# Patient Record
Sex: Male | Born: 1935 | Race: White | Hispanic: No | Smoking: Former smoker
Health system: Southern US, Community
[De-identification: ages and names within clinical notes are randomized; demographics above are authoritative.]

## PROBLEM LIST (undated history)

## (undated) DIAGNOSIS — T4145XA Adverse effect of unspecified anesthetic, initial encounter: Secondary | ICD-10-CM

## (undated) DIAGNOSIS — I10 Essential (primary) hypertension: Secondary | ICD-10-CM

## (undated) DIAGNOSIS — H5462 Unqualified visual loss, left eye, normal vision right eye: Secondary | ICD-10-CM

## (undated) DIAGNOSIS — I442 Atrioventricular block, complete: Secondary | ICD-10-CM

## (undated) DIAGNOSIS — C801 Malignant (primary) neoplasm, unspecified: Secondary | ICD-10-CM

## (undated) DIAGNOSIS — G250 Essential tremor: Secondary | ICD-10-CM

## (undated) DIAGNOSIS — K8020 Calculus of gallbladder without cholecystitis without obstruction: Secondary | ICD-10-CM

## (undated) DIAGNOSIS — I495 Sick sinus syndrome: Secondary | ICD-10-CM

## (undated) DIAGNOSIS — I499 Cardiac arrhythmia, unspecified: Secondary | ICD-10-CM

## (undated) DIAGNOSIS — T8859XA Other complications of anesthesia, initial encounter: Secondary | ICD-10-CM

## (undated) DIAGNOSIS — E785 Hyperlipidemia, unspecified: Secondary | ICD-10-CM

## (undated) DIAGNOSIS — I219 Acute myocardial infarction, unspecified: Secondary | ICD-10-CM

## (undated) DIAGNOSIS — R0600 Dyspnea, unspecified: Secondary | ICD-10-CM

## (undated) DIAGNOSIS — R2689 Other abnormalities of gait and mobility: Secondary | ICD-10-CM

## (undated) DIAGNOSIS — H9192 Unspecified hearing loss, left ear: Secondary | ICD-10-CM

## (undated) DIAGNOSIS — I4891 Unspecified atrial fibrillation: Secondary | ICD-10-CM

## (undated) DIAGNOSIS — E039 Hypothyroidism, unspecified: Secondary | ICD-10-CM

## (undated) DIAGNOSIS — E119 Type 2 diabetes mellitus without complications: Secondary | ICD-10-CM

## (undated) DIAGNOSIS — N183 Chronic kidney disease, stage 3 unspecified: Secondary | ICD-10-CM

## (undated) DIAGNOSIS — J45909 Unspecified asthma, uncomplicated: Secondary | ICD-10-CM

## (undated) DIAGNOSIS — C4490 Unspecified malignant neoplasm of skin, unspecified: Secondary | ICD-10-CM

## (undated) DIAGNOSIS — Z950 Presence of cardiac pacemaker: Secondary | ICD-10-CM

## (undated) DIAGNOSIS — J449 Chronic obstructive pulmonary disease, unspecified: Secondary | ICD-10-CM

## (undated) DIAGNOSIS — I700 Atherosclerosis of aorta: Secondary | ICD-10-CM

## (undated) DIAGNOSIS — R4189 Other symptoms and signs involving cognitive functions and awareness: Secondary | ICD-10-CM

## (undated) DIAGNOSIS — K760 Fatty (change of) liver, not elsewhere classified: Secondary | ICD-10-CM

## (undated) HISTORY — PX: CATARACT EXTRACTION: SUR2

## (undated) HISTORY — PX: PACEMAKER GENERATOR CHANGE: SHX5998

## (undated) HISTORY — PX: NASAL SINUS SURGERY: SHX719

## (undated) HISTORY — PX: EYE SURGERY: SHX253

## (undated) HISTORY — PX: PACEMAKER INSERTION: SHX728

## (undated) HISTORY — PX: PROSTATE SURGERY: SHX751

---
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
## 1999-12-06 DIAGNOSIS — Z950 Presence of cardiac pacemaker: Secondary | ICD-10-CM

## 1999-12-06 HISTORY — DX: Presence of cardiac pacemaker: Z95.0

## 1999-12-06 HISTORY — PX: PACEMAKER INSERTION: SHX728

## 2000-09-16 HISTORY — PX: PACEMAKER INSERTION: SHX728

## 2006-05-06 ENCOUNTER — Ambulatory Visit: Payer: Self-pay | Admitting: Internal Medicine

## 2006-05-07 ENCOUNTER — Inpatient Hospital Stay: Payer: Self-pay | Admitting: Internal Medicine

## 2006-05-07 HISTORY — PX: PACEMAKER GENERATOR CHANGE: SHX5998

## 2007-10-05 ENCOUNTER — Ambulatory Visit: Payer: Self-pay | Admitting: Internal Medicine

## 2008-02-18 ENCOUNTER — Other Ambulatory Visit: Payer: Self-pay

## 2008-02-18 ENCOUNTER — Emergency Department: Payer: Self-pay | Admitting: Emergency Medicine

## 2008-03-01 ENCOUNTER — Emergency Department: Payer: Self-pay | Admitting: Emergency Medicine

## 2008-03-05 ENCOUNTER — Emergency Department: Payer: Self-pay | Admitting: Internal Medicine

## 2008-03-15 ENCOUNTER — Emergency Department: Payer: Self-pay | Admitting: Urology

## 2008-03-29 ENCOUNTER — Ambulatory Visit: Payer: Self-pay | Admitting: Specialist

## 2008-04-08 ENCOUNTER — Inpatient Hospital Stay: Payer: Self-pay | Admitting: Specialist

## 2011-05-30 ENCOUNTER — Ambulatory Visit: Payer: Self-pay | Admitting: Internal Medicine

## 2011-06-05 ENCOUNTER — Ambulatory Visit: Payer: Self-pay | Admitting: Internal Medicine

## 2011-07-30 ENCOUNTER — Ambulatory Visit: Payer: Self-pay | Admitting: Emergency Medicine

## 2011-08-02 LAB — PATHOLOGY REPORT

## 2011-09-20 DIAGNOSIS — E119 Type 2 diabetes mellitus without complications: Secondary | ICD-10-CM | POA: Diagnosis not present

## 2011-09-20 DIAGNOSIS — I10 Essential (primary) hypertension: Secondary | ICD-10-CM | POA: Diagnosis not present

## 2011-09-20 DIAGNOSIS — E039 Hypothyroidism, unspecified: Secondary | ICD-10-CM | POA: Diagnosis not present

## 2011-09-20 DIAGNOSIS — D518 Other vitamin B12 deficiency anemias: Secondary | ICD-10-CM | POA: Diagnosis not present

## 2011-10-23 DIAGNOSIS — E039 Hypothyroidism, unspecified: Secondary | ICD-10-CM | POA: Diagnosis not present

## 2011-12-20 DIAGNOSIS — D518 Other vitamin B12 deficiency anemias: Secondary | ICD-10-CM | POA: Diagnosis not present

## 2011-12-20 DIAGNOSIS — E119 Type 2 diabetes mellitus without complications: Secondary | ICD-10-CM | POA: Diagnosis not present

## 2011-12-20 DIAGNOSIS — E039 Hypothyroidism, unspecified: Secondary | ICD-10-CM | POA: Diagnosis not present

## 2011-12-20 DIAGNOSIS — I10 Essential (primary) hypertension: Secondary | ICD-10-CM | POA: Diagnosis not present

## 2012-01-30 DIAGNOSIS — R238 Other skin changes: Secondary | ICD-10-CM | POA: Diagnosis not present

## 2012-01-30 DIAGNOSIS — D485 Neoplasm of uncertain behavior of skin: Secondary | ICD-10-CM | POA: Diagnosis not present

## 2012-01-30 DIAGNOSIS — L259 Unspecified contact dermatitis, unspecified cause: Secondary | ICD-10-CM | POA: Diagnosis not present

## 2012-01-30 DIAGNOSIS — C44519 Basal cell carcinoma of skin of other part of trunk: Secondary | ICD-10-CM | POA: Diagnosis not present

## 2012-02-06 DIAGNOSIS — L259 Unspecified contact dermatitis, unspecified cause: Secondary | ICD-10-CM | POA: Diagnosis not present

## 2012-02-06 DIAGNOSIS — D485 Neoplasm of uncertain behavior of skin: Secondary | ICD-10-CM | POA: Diagnosis not present

## 2012-02-06 DIAGNOSIS — C44611 Basal cell carcinoma of skin of unspecified upper limb, including shoulder: Secondary | ICD-10-CM | POA: Diagnosis not present

## 2012-03-25 DIAGNOSIS — L570 Actinic keratosis: Secondary | ICD-10-CM | POA: Diagnosis not present

## 2012-03-25 DIAGNOSIS — D485 Neoplasm of uncertain behavior of skin: Secondary | ICD-10-CM | POA: Diagnosis not present

## 2012-03-25 DIAGNOSIS — C44519 Basal cell carcinoma of skin of other part of trunk: Secondary | ICD-10-CM | POA: Diagnosis not present

## 2012-03-25 DIAGNOSIS — C44611 Basal cell carcinoma of skin of unspecified upper limb, including shoulder: Secondary | ICD-10-CM | POA: Diagnosis not present

## 2012-03-26 DIAGNOSIS — E119 Type 2 diabetes mellitus without complications: Secondary | ICD-10-CM | POA: Diagnosis not present

## 2012-03-26 DIAGNOSIS — E559 Vitamin D deficiency, unspecified: Secondary | ICD-10-CM | POA: Diagnosis not present

## 2012-03-26 DIAGNOSIS — N400 Enlarged prostate without lower urinary tract symptoms: Secondary | ICD-10-CM | POA: Diagnosis not present

## 2012-03-26 DIAGNOSIS — E039 Hypothyroidism, unspecified: Secondary | ICD-10-CM | POA: Diagnosis not present

## 2012-03-26 DIAGNOSIS — Z950 Presence of cardiac pacemaker: Secondary | ICD-10-CM | POA: Diagnosis not present

## 2012-03-26 DIAGNOSIS — J989 Respiratory disorder, unspecified: Secondary | ICD-10-CM | POA: Diagnosis not present

## 2012-03-26 DIAGNOSIS — E7800 Pure hypercholesterolemia, unspecified: Secondary | ICD-10-CM | POA: Diagnosis not present

## 2012-04-01 DIAGNOSIS — C44711 Basal cell carcinoma of skin of unspecified lower limb, including hip: Secondary | ICD-10-CM | POA: Diagnosis not present

## 2012-04-01 DIAGNOSIS — L570 Actinic keratosis: Secondary | ICD-10-CM | POA: Diagnosis not present

## 2012-04-01 DIAGNOSIS — D485 Neoplasm of uncertain behavior of skin: Secondary | ICD-10-CM | POA: Diagnosis not present

## 2012-04-22 DIAGNOSIS — C4491 Basal cell carcinoma of skin, unspecified: Secondary | ICD-10-CM | POA: Diagnosis not present

## 2012-06-02 DIAGNOSIS — H25019 Cortical age-related cataract, unspecified eye: Secondary | ICD-10-CM | POA: Diagnosis not present

## 2012-06-23 DIAGNOSIS — N400 Enlarged prostate without lower urinary tract symptoms: Secondary | ICD-10-CM | POA: Diagnosis not present

## 2012-06-23 DIAGNOSIS — I10 Essential (primary) hypertension: Secondary | ICD-10-CM | POA: Diagnosis not present

## 2012-06-23 DIAGNOSIS — E559 Vitamin D deficiency, unspecified: Secondary | ICD-10-CM | POA: Diagnosis not present

## 2012-06-23 DIAGNOSIS — E039 Hypothyroidism, unspecified: Secondary | ICD-10-CM | POA: Diagnosis not present

## 2012-06-23 DIAGNOSIS — E119 Type 2 diabetes mellitus without complications: Secondary | ICD-10-CM | POA: Diagnosis not present

## 2012-07-07 DIAGNOSIS — L821 Other seborrheic keratosis: Secondary | ICD-10-CM | POA: Diagnosis not present

## 2012-07-07 DIAGNOSIS — L570 Actinic keratosis: Secondary | ICD-10-CM | POA: Diagnosis not present

## 2012-07-07 DIAGNOSIS — Z85828 Personal history of other malignant neoplasm of skin: Secondary | ICD-10-CM | POA: Diagnosis not present

## 2012-07-07 DIAGNOSIS — L259 Unspecified contact dermatitis, unspecified cause: Secondary | ICD-10-CM | POA: Diagnosis not present

## 2012-07-07 DIAGNOSIS — L578 Other skin changes due to chronic exposure to nonionizing radiation: Secondary | ICD-10-CM | POA: Diagnosis not present

## 2012-07-28 DIAGNOSIS — L570 Actinic keratosis: Secondary | ICD-10-CM | POA: Diagnosis not present

## 2012-09-23 DIAGNOSIS — I10 Essential (primary) hypertension: Secondary | ICD-10-CM | POA: Diagnosis not present

## 2012-09-23 DIAGNOSIS — E039 Hypothyroidism, unspecified: Secondary | ICD-10-CM | POA: Diagnosis not present

## 2012-09-23 DIAGNOSIS — E119 Type 2 diabetes mellitus without complications: Secondary | ICD-10-CM | POA: Diagnosis not present

## 2012-12-24 DIAGNOSIS — I10 Essential (primary) hypertension: Secondary | ICD-10-CM | POA: Diagnosis not present

## 2012-12-24 DIAGNOSIS — E119 Type 2 diabetes mellitus without complications: Secondary | ICD-10-CM | POA: Diagnosis not present

## 2012-12-24 DIAGNOSIS — E785 Hyperlipidemia, unspecified: Secondary | ICD-10-CM | POA: Diagnosis not present

## 2012-12-24 DIAGNOSIS — E039 Hypothyroidism, unspecified: Secondary | ICD-10-CM | POA: Diagnosis not present

## 2013-01-19 DIAGNOSIS — L259 Unspecified contact dermatitis, unspecified cause: Secondary | ICD-10-CM | POA: Diagnosis not present

## 2013-01-19 DIAGNOSIS — D1800 Hemangioma unspecified site: Secondary | ICD-10-CM | POA: Diagnosis not present

## 2013-01-19 DIAGNOSIS — L570 Actinic keratosis: Secondary | ICD-10-CM | POA: Diagnosis not present

## 2013-01-19 DIAGNOSIS — L821 Other seborrheic keratosis: Secondary | ICD-10-CM | POA: Diagnosis not present

## 2013-01-19 DIAGNOSIS — Z85828 Personal history of other malignant neoplasm of skin: Secondary | ICD-10-CM | POA: Diagnosis not present

## 2013-01-28 DIAGNOSIS — E785 Hyperlipidemia, unspecified: Secondary | ICD-10-CM | POA: Diagnosis not present

## 2013-01-28 DIAGNOSIS — I10 Essential (primary) hypertension: Secondary | ICD-10-CM | POA: Diagnosis not present

## 2013-01-28 DIAGNOSIS — I4891 Unspecified atrial fibrillation: Secondary | ICD-10-CM | POA: Diagnosis not present

## 2013-01-28 DIAGNOSIS — Z45018 Encounter for adjustment and management of other part of cardiac pacemaker: Secondary | ICD-10-CM | POA: Diagnosis not present

## 2013-01-28 DIAGNOSIS — Z950 Presence of cardiac pacemaker: Secondary | ICD-10-CM | POA: Diagnosis not present

## 2013-04-14 DIAGNOSIS — E039 Hypothyroidism, unspecified: Secondary | ICD-10-CM | POA: Diagnosis not present

## 2013-04-14 DIAGNOSIS — N400 Enlarged prostate without lower urinary tract symptoms: Secondary | ICD-10-CM | POA: Diagnosis not present

## 2013-04-14 DIAGNOSIS — E785 Hyperlipidemia, unspecified: Secondary | ICD-10-CM | POA: Diagnosis not present

## 2013-04-14 DIAGNOSIS — E119 Type 2 diabetes mellitus without complications: Secondary | ICD-10-CM | POA: Diagnosis not present

## 2013-04-20 DIAGNOSIS — Z950 Presence of cardiac pacemaker: Secondary | ICD-10-CM | POA: Diagnosis not present

## 2013-04-20 DIAGNOSIS — E785 Hyperlipidemia, unspecified: Secondary | ICD-10-CM | POA: Diagnosis not present

## 2013-04-20 DIAGNOSIS — I10 Essential (primary) hypertension: Secondary | ICD-10-CM | POA: Diagnosis not present

## 2013-04-21 DIAGNOSIS — N19 Unspecified kidney failure: Secondary | ICD-10-CM | POA: Diagnosis not present

## 2013-04-21 DIAGNOSIS — N281 Cyst of kidney, acquired: Secondary | ICD-10-CM | POA: Diagnosis not present

## 2013-06-07 DIAGNOSIS — H25019 Cortical age-related cataract, unspecified eye: Secondary | ICD-10-CM | POA: Diagnosis not present

## 2013-07-15 DIAGNOSIS — E785 Hyperlipidemia, unspecified: Secondary | ICD-10-CM | POA: Diagnosis not present

## 2013-07-15 DIAGNOSIS — N400 Enlarged prostate without lower urinary tract symptoms: Secondary | ICD-10-CM | POA: Diagnosis not present

## 2013-07-15 DIAGNOSIS — I10 Essential (primary) hypertension: Secondary | ICD-10-CM | POA: Diagnosis not present

## 2013-07-15 DIAGNOSIS — E039 Hypothyroidism, unspecified: Secondary | ICD-10-CM | POA: Diagnosis not present

## 2013-07-15 DIAGNOSIS — E119 Type 2 diabetes mellitus without complications: Secondary | ICD-10-CM | POA: Diagnosis not present

## 2013-07-20 DIAGNOSIS — L570 Actinic keratosis: Secondary | ICD-10-CM | POA: Diagnosis not present

## 2013-07-20 DIAGNOSIS — L820 Inflamed seborrheic keratosis: Secondary | ICD-10-CM | POA: Diagnosis not present

## 2013-07-20 DIAGNOSIS — L578 Other skin changes due to chronic exposure to nonionizing radiation: Secondary | ICD-10-CM | POA: Diagnosis not present

## 2013-07-20 DIAGNOSIS — D239 Other benign neoplasm of skin, unspecified: Secondary | ICD-10-CM | POA: Diagnosis not present

## 2013-07-20 DIAGNOSIS — L738 Other specified follicular disorders: Secondary | ICD-10-CM | POA: Diagnosis not present

## 2013-07-20 DIAGNOSIS — L719 Rosacea, unspecified: Secondary | ICD-10-CM | POA: Diagnosis not present

## 2013-07-20 DIAGNOSIS — L259 Unspecified contact dermatitis, unspecified cause: Secondary | ICD-10-CM | POA: Diagnosis not present

## 2013-07-20 DIAGNOSIS — L821 Other seborrheic keratosis: Secondary | ICD-10-CM | POA: Diagnosis not present

## 2013-07-22 DIAGNOSIS — E669 Obesity, unspecified: Secondary | ICD-10-CM | POA: Diagnosis not present

## 2013-07-22 DIAGNOSIS — Z950 Presence of cardiac pacemaker: Secondary | ICD-10-CM | POA: Diagnosis not present

## 2013-07-22 DIAGNOSIS — I10 Essential (primary) hypertension: Secondary | ICD-10-CM | POA: Diagnosis not present

## 2013-07-22 DIAGNOSIS — I499 Cardiac arrhythmia, unspecified: Secondary | ICD-10-CM | POA: Diagnosis not present

## 2013-10-14 DIAGNOSIS — I10 Essential (primary) hypertension: Secondary | ICD-10-CM | POA: Diagnosis not present

## 2013-10-14 DIAGNOSIS — R7989 Other specified abnormal findings of blood chemistry: Secondary | ICD-10-CM | POA: Diagnosis not present

## 2013-10-14 DIAGNOSIS — E039 Hypothyroidism, unspecified: Secondary | ICD-10-CM | POA: Diagnosis not present

## 2013-10-14 DIAGNOSIS — E785 Hyperlipidemia, unspecified: Secondary | ICD-10-CM | POA: Diagnosis not present

## 2013-10-21 DIAGNOSIS — I10 Essential (primary) hypertension: Secondary | ICD-10-CM | POA: Diagnosis not present

## 2013-10-21 DIAGNOSIS — Z950 Presence of cardiac pacemaker: Secondary | ICD-10-CM | POA: Diagnosis not present

## 2013-11-08 DIAGNOSIS — E039 Hypothyroidism, unspecified: Secondary | ICD-10-CM | POA: Diagnosis not present

## 2014-01-10 DIAGNOSIS — E119 Type 2 diabetes mellitus without complications: Secondary | ICD-10-CM | POA: Diagnosis not present

## 2014-01-10 DIAGNOSIS — E785 Hyperlipidemia, unspecified: Secondary | ICD-10-CM | POA: Diagnosis not present

## 2014-01-10 DIAGNOSIS — E039 Hypothyroidism, unspecified: Secondary | ICD-10-CM | POA: Diagnosis not present

## 2014-01-10 DIAGNOSIS — I10 Essential (primary) hypertension: Secondary | ICD-10-CM | POA: Diagnosis not present

## 2014-01-20 DIAGNOSIS — Z45018 Encounter for adjustment and management of other part of cardiac pacemaker: Secondary | ICD-10-CM | POA: Diagnosis not present

## 2014-01-20 DIAGNOSIS — I499 Cardiac arrhythmia, unspecified: Secondary | ICD-10-CM | POA: Diagnosis not present

## 2014-01-20 DIAGNOSIS — E669 Obesity, unspecified: Secondary | ICD-10-CM | POA: Diagnosis not present

## 2014-01-20 DIAGNOSIS — E785 Hyperlipidemia, unspecified: Secondary | ICD-10-CM | POA: Diagnosis not present

## 2014-01-20 DIAGNOSIS — I10 Essential (primary) hypertension: Secondary | ICD-10-CM | POA: Diagnosis not present

## 2014-03-08 DIAGNOSIS — E039 Hypothyroidism, unspecified: Secondary | ICD-10-CM | POA: Diagnosis not present

## 2014-03-08 DIAGNOSIS — N289 Disorder of kidney and ureter, unspecified: Secondary | ICD-10-CM | POA: Diagnosis not present

## 2014-04-12 DIAGNOSIS — E039 Hypothyroidism, unspecified: Secondary | ICD-10-CM | POA: Diagnosis not present

## 2014-04-21 DIAGNOSIS — Z950 Presence of cardiac pacemaker: Secondary | ICD-10-CM | POA: Diagnosis not present

## 2014-04-21 DIAGNOSIS — E785 Hyperlipidemia, unspecified: Secondary | ICD-10-CM | POA: Diagnosis not present

## 2014-04-21 DIAGNOSIS — I10 Essential (primary) hypertension: Secondary | ICD-10-CM | POA: Diagnosis not present

## 2014-04-21 DIAGNOSIS — Z45018 Encounter for adjustment and management of other part of cardiac pacemaker: Secondary | ICD-10-CM | POA: Diagnosis not present

## 2014-07-19 DIAGNOSIS — E119 Type 2 diabetes mellitus without complications: Secondary | ICD-10-CM | POA: Diagnosis not present

## 2014-07-19 DIAGNOSIS — E039 Hypothyroidism, unspecified: Secondary | ICD-10-CM | POA: Diagnosis not present

## 2014-07-19 DIAGNOSIS — E559 Vitamin D deficiency, unspecified: Secondary | ICD-10-CM | POA: Diagnosis not present

## 2014-07-19 DIAGNOSIS — N289 Disorder of kidney and ureter, unspecified: Secondary | ICD-10-CM | POA: Diagnosis not present

## 2014-07-19 DIAGNOSIS — N400 Enlarged prostate without lower urinary tract symptoms: Secondary | ICD-10-CM | POA: Diagnosis not present

## 2014-07-21 DIAGNOSIS — I119 Hypertensive heart disease without heart failure: Secondary | ICD-10-CM | POA: Diagnosis not present

## 2014-07-21 DIAGNOSIS — E784 Other hyperlipidemia: Secondary | ICD-10-CM | POA: Diagnosis not present

## 2014-07-21 DIAGNOSIS — Z950 Presence of cardiac pacemaker: Secondary | ICD-10-CM | POA: Diagnosis not present

## 2014-07-21 DIAGNOSIS — Z45010 Encounter for checking and testing of cardiac pacemaker pulse generator [battery]: Secondary | ICD-10-CM | POA: Diagnosis not present

## 2014-07-27 DIAGNOSIS — B354 Tinea corporis: Secondary | ICD-10-CM | POA: Diagnosis not present

## 2014-07-27 DIAGNOSIS — I8310 Varicose veins of unspecified lower extremity with inflammation: Secondary | ICD-10-CM | POA: Diagnosis not present

## 2014-07-27 DIAGNOSIS — L718 Other rosacea: Secondary | ICD-10-CM | POA: Diagnosis not present

## 2014-07-27 DIAGNOSIS — L578 Other skin changes due to chronic exposure to nonionizing radiation: Secondary | ICD-10-CM | POA: Diagnosis not present

## 2014-07-27 DIAGNOSIS — L300 Nummular dermatitis: Secondary | ICD-10-CM | POA: Diagnosis not present

## 2014-07-27 DIAGNOSIS — B359 Dermatophytosis, unspecified: Secondary | ICD-10-CM | POA: Diagnosis not present

## 2014-07-27 DIAGNOSIS — Z1283 Encounter for screening for malignant neoplasm of skin: Secondary | ICD-10-CM | POA: Diagnosis not present

## 2014-07-27 DIAGNOSIS — E08620 Diabetes mellitus due to underlying condition with diabetic dermatitis: Secondary | ICD-10-CM | POA: Diagnosis not present

## 2014-08-15 DIAGNOSIS — H269 Unspecified cataract: Secondary | ICD-10-CM | POA: Diagnosis not present

## 2014-09-05 DIAGNOSIS — E039 Hypothyroidism, unspecified: Secondary | ICD-10-CM | POA: Diagnosis not present

## 2014-09-28 DIAGNOSIS — B359 Dermatophytosis, unspecified: Secondary | ICD-10-CM | POA: Diagnosis not present

## 2014-09-28 DIAGNOSIS — L300 Nummular dermatitis: Secondary | ICD-10-CM | POA: Diagnosis not present

## 2014-09-28 DIAGNOSIS — Z79899 Other long term (current) drug therapy: Secondary | ICD-10-CM | POA: Diagnosis not present

## 2014-10-17 DIAGNOSIS — E119 Type 2 diabetes mellitus without complications: Secondary | ICD-10-CM | POA: Diagnosis not present

## 2014-10-17 DIAGNOSIS — E039 Hypothyroidism, unspecified: Secondary | ICD-10-CM | POA: Diagnosis not present

## 2014-10-17 DIAGNOSIS — N400 Enlarged prostate without lower urinary tract symptoms: Secondary | ICD-10-CM | POA: Diagnosis not present

## 2014-10-17 DIAGNOSIS — N289 Disorder of kidney and ureter, unspecified: Secondary | ICD-10-CM | POA: Diagnosis not present

## 2014-11-28 DIAGNOSIS — I8310 Varicose veins of unspecified lower extremity with inflammation: Secondary | ICD-10-CM | POA: Diagnosis not present

## 2014-11-28 DIAGNOSIS — D229 Melanocytic nevi, unspecified: Secondary | ICD-10-CM | POA: Diagnosis not present

## 2014-11-28 DIAGNOSIS — Z85828 Personal history of other malignant neoplasm of skin: Secondary | ICD-10-CM | POA: Diagnosis not present

## 2014-11-28 DIAGNOSIS — B353 Tinea pedis: Secondary | ICD-10-CM | POA: Diagnosis not present

## 2014-11-28 DIAGNOSIS — L300 Nummular dermatitis: Secondary | ICD-10-CM | POA: Diagnosis not present

## 2015-01-17 DIAGNOSIS — I158 Other secondary hypertension: Secondary | ICD-10-CM | POA: Diagnosis not present

## 2015-01-17 DIAGNOSIS — N289 Disorder of kidney and ureter, unspecified: Secondary | ICD-10-CM | POA: Diagnosis not present

## 2015-01-17 DIAGNOSIS — E039 Hypothyroidism, unspecified: Secondary | ICD-10-CM | POA: Diagnosis not present

## 2015-01-17 DIAGNOSIS — E119 Type 2 diabetes mellitus without complications: Secondary | ICD-10-CM | POA: Diagnosis not present

## 2015-01-26 ENCOUNTER — Encounter
Admission: RE | Admit: 2015-01-26 | Discharge: 2015-01-26 | Disposition: A | Discharge status: Home / Self Care | Payer: MEDICARE | Source: Ambulatory Visit | Attending: Internal Medicine | Admitting: Internal Medicine

## 2015-01-26 ENCOUNTER — Ambulatory Visit
Admission: RE | Admit: 2015-01-26 | Discharge: 2015-01-26 | Disposition: A | Discharge status: Home / Self Care | Payer: MEDICARE | Source: Ambulatory Visit | Attending: Cardiology | Admitting: Cardiology

## 2015-01-26 DIAGNOSIS — Z01818 Encounter for other preprocedural examination: Secondary | ICD-10-CM | POA: Insufficient documentation

## 2015-01-26 DIAGNOSIS — Z45018 Encounter for adjustment and management of other part of cardiac pacemaker: Secondary | ICD-10-CM | POA: Diagnosis not present

## 2015-01-26 DIAGNOSIS — Z950 Presence of cardiac pacemaker: Secondary | ICD-10-CM

## 2015-01-26 DIAGNOSIS — Z01810 Encounter for preprocedural cardiovascular examination: Secondary | ICD-10-CM | POA: Diagnosis not present

## 2015-01-26 HISTORY — DX: Hypothyroidism, unspecified: E03.9

## 2015-01-26 HISTORY — DX: Acute myocardial infarction, unspecified: I21.9

## 2015-01-26 HISTORY — DX: Essential (primary) hypertension: I10

## 2015-01-26 HISTORY — DX: Presence of cardiac pacemaker: Z95.0

## 2015-01-26 HISTORY — DX: Hyperlipidemia, unspecified: E78.5

## 2015-01-26 HISTORY — DX: Cardiac arrhythmia, unspecified: I49.9

## 2015-01-26 HISTORY — DX: Unspecified asthma, uncomplicated: J45.909

## 2015-01-26 LAB — DIFFERENTIAL
Basophils Absolute: 0.1 10*3/uL (ref 0–0.1)
Basophils Relative: 1 %
Eosinophils Absolute: 0.4 10*3/uL (ref 0–0.7)
Eosinophils Relative: 4 %
LYMPHS PCT: 28 %
Lymphs Abs: 2.9 10*3/uL (ref 1.0–3.6)
MONOS PCT: 6 %
Monocytes Absolute: 0.6 10*3/uL (ref 0.2–1.0)
NEUTROS ABS: 6.2 10*3/uL (ref 1.4–6.5)
NEUTROS PCT: 61 %

## 2015-01-26 LAB — URINALYSIS COMPLETE WITH MICROSCOPIC (ARMC ONLY)
BILIRUBIN URINE: NEGATIVE
Bacteria, UA: NONE SEEN
Glucose, UA: NEGATIVE mg/dL
Hgb urine dipstick: NEGATIVE
KETONES UR: NEGATIVE mg/dL
NITRITE: NEGATIVE
PH: 6 (ref 5.0–8.0)
PROTEIN: NEGATIVE mg/dL
Specific Gravity, Urine: 1.01 (ref 1.005–1.030)
Squamous Epithelial / LPF: NONE SEEN

## 2015-01-26 LAB — BASIC METABOLIC PANEL
Anion gap: 8 (ref 5–15)
BUN: 20 mg/dL (ref 6–20)
CO2: 24 mmol/L (ref 22–32)
Calcium: 9.1 mg/dL (ref 8.9–10.3)
Chloride: 107 mmol/L (ref 101–111)
Creatinine, Ser: 1.55 mg/dL — ABNORMAL HIGH (ref 0.61–1.24)
GFR calc Af Amer: 46 mL/min — ABNORMAL LOW (ref >60)
GFR calc non Af Amer: 40 mL/min — ABNORMAL LOW (ref >60)
GLUCOSE: 121 mg/dL — AB (ref 65–99)
POTASSIUM: 4.4 mmol/L (ref 3.5–5.1)
Sodium: 139 mmol/L (ref 135–145)

## 2015-01-26 LAB — CBC
HCT: 45.1 % (ref 40.0–52.0)
Hemoglobin: 14.9 g/dL (ref 13.0–18.0)
MCH: 30.3 pg (ref 26.0–34.0)
MCHC: 33.1 g/dL (ref 32.0–36.0)
MCV: 91.6 fL (ref 80.0–100.0)
PLATELETS: 207 10*3/uL (ref 150–440)
RBC: 4.93 MIL/uL (ref 4.40–5.90)
RDW: 13.5 % (ref 11.5–14.5)
WBC: 10.2 10*3/uL (ref 3.8–10.6)

## 2015-01-26 NOTE — Patient Instructions (Signed)
  Your procedure is scheduled on: 02/08/15 Report to Day Surgery. To find out your arrival time please call [phone] between 1PM - 3PM on 02/07/15.  Remember: Instructions that are not followed completely may result in serious medical risk, up to and including death, or upon the discretion of your surgeon and anesthesiologist your surgery may need to be rescheduled.    __x__ 1. Do not eat food or drink liquids after midnight. No gum chewing or hard candies.     ____ 2. No Alcohol for 24 hours before or after surgery.   ____ 3. Bring all medications with you on the day of surgery if instructed.    ____ 4. Notify your doctor if there is any change in your medical condition     (cold, fever, infections).     Do not wear jewelry, make-up, hairpins, clips or nail polish.  Do not wear lotions, powders, or perfumes. You may wear deodorant.  Do not shave 48 hours prior to surgery. Men may shave face and neck.  Do not bring valuables to the hospital.    [facility] is not responsible for any belongings or valuables.               Contacts, dentures or bridgework may not be worn into surgery.  Leave your suitcase in the car. After surgery it may be brought to your room.  For patients admitted to the hospital, discharge time is determined by your                treatment team.   Patients discharged the day of surgery will not be allowed to drive home.   Please read over the following fact sheets that you were given:      ____ Take these medicines the morning of surgery with A SIP OF WATER:    1. digoxin (LANOXIN) 0.125 MG tablet  2. diltiazem (DILACOR XR) 180 MG 24 hr capsule  3. levothyroxine (SYNTHROID, LEVOTHROID) 150 MCG table  4.  5.  6.  ____ Fleet Enema (as directed)   __x__ Use CHG Soap as directed  ____ Use inhalers on the day of surgery  ____ Stop metformin 2 days prior to surgery    ____ Take 1/2 of usual insulin dose the night before surgery and none on the morning  of surgery.   _x___ Stop Coumadin/Plavix/aspirin on Stop aspirin 1 week before surgery  ____ Stop Anti-inflammatories on    ____ Stop supplements until after surgery.    ____ Bring C-Pap to the hospital.

## 2015-01-26 NOTE — OR Nursing (Signed)
Dr Lavera Guise notified regarding allergy to amoxicillin "swelling".

## 2015-02-08 ENCOUNTER — Ambulatory Visit
Admission: RE | Admit: 2015-02-08 | Discharge: 2015-02-08 | Disposition: A | Discharge status: Home / Self Care | Payer: MEDICARE | Source: Ambulatory Visit | Attending: Cardiology | Admitting: Cardiology

## 2015-02-08 ENCOUNTER — Ambulatory Visit: Payer: MEDICARE | Admitting: Certified Registered Nurse Anesthetist

## 2015-02-08 ENCOUNTER — Encounter
Admission: RE | Disposition: A | Discharge status: Home / Self Care | Payer: Self-pay | Source: Ambulatory Visit | Attending: Cardiology

## 2015-02-08 DIAGNOSIS — Z880 Allergy status to penicillin: Secondary | ICD-10-CM | POA: Insufficient documentation

## 2015-02-08 DIAGNOSIS — J45909 Unspecified asthma, uncomplicated: Secondary | ICD-10-CM | POA: Insufficient documentation

## 2015-02-08 DIAGNOSIS — I119 Hypertensive heart disease without heart failure: Secondary | ICD-10-CM | POA: Diagnosis not present

## 2015-02-08 DIAGNOSIS — Z79899 Other long term (current) drug therapy: Secondary | ICD-10-CM | POA: Diagnosis not present

## 2015-02-08 DIAGNOSIS — Z45010 Encounter for checking and testing of cardiac pacemaker pulse generator [battery]: Secondary | ICD-10-CM | POA: Diagnosis not present

## 2015-02-08 DIAGNOSIS — Z87891 Personal history of nicotine dependence: Secondary | ICD-10-CM | POA: Insufficient documentation

## 2015-02-08 DIAGNOSIS — E669 Obesity, unspecified: Secondary | ICD-10-CM | POA: Insufficient documentation

## 2015-02-08 DIAGNOSIS — E119 Type 2 diabetes mellitus without complications: Secondary | ICD-10-CM | POA: Diagnosis not present

## 2015-02-08 DIAGNOSIS — I4891 Unspecified atrial fibrillation: Secondary | ICD-10-CM | POA: Diagnosis not present

## 2015-02-08 DIAGNOSIS — I252 Old myocardial infarction: Secondary | ICD-10-CM | POA: Insufficient documentation

## 2015-02-08 DIAGNOSIS — I498 Other specified cardiac arrhythmias: Secondary | ICD-10-CM | POA: Insufficient documentation

## 2015-02-08 DIAGNOSIS — E039 Hypothyroidism, unspecified: Secondary | ICD-10-CM | POA: Insufficient documentation

## 2015-02-08 DIAGNOSIS — I10 Essential (primary) hypertension: Secondary | ICD-10-CM | POA: Diagnosis not present

## 2015-02-08 DIAGNOSIS — Z7982 Long term (current) use of aspirin: Secondary | ICD-10-CM | POA: Insufficient documentation

## 2015-02-08 DIAGNOSIS — H9190 Unspecified hearing loss, unspecified ear: Secondary | ICD-10-CM | POA: Insufficient documentation

## 2015-02-08 DIAGNOSIS — E785 Hyperlipidemia, unspecified: Secondary | ICD-10-CM | POA: Diagnosis present

## 2015-02-08 HISTORY — PX: PACEMAKER GENERATOR CHANGE: SHX5998

## 2015-02-08 SURGERY — PACEMAKER GENERATOR CHANGE
Anesthesia: Monitor Anesthesia Care | Site: Chest | Wound class: Clean

## 2015-02-08 MED ORDER — FAMOTIDINE 20 MG PO TABS
ORAL_TABLET | ORAL | Status: AC
  Administered 2015-02-08: 20 mg via ORAL
  Filled 2015-02-08: qty 1

## 2015-02-08 MED ORDER — LIDOCAINE HCL (PF) 1 % IJ SOLN
INTRAMUSCULAR | Status: DC | PRN
  Administered 2015-02-08: 14 mL via SUBCUTANEOUS

## 2015-02-08 MED ORDER — GENTAMICIN SULFATE 40 MG/ML IJ SOLN
INTRAMUSCULAR | Status: AC
  Filled 2015-02-08: qty 2

## 2015-02-08 MED ORDER — FENTANYL CITRATE (PF) 100 MCG/2ML IJ SOLN
INTRAMUSCULAR | Status: DC | PRN
  Administered 2015-02-08 (×2): 25 ug via INTRAVENOUS

## 2015-02-08 MED ORDER — CEFAZOLIN SODIUM 1-5 GM-% IV SOLN
1.0000 g | Freq: Once | INTRAVENOUS | Status: AC
  Administered 2015-02-08: 1 g via INTRAVENOUS

## 2015-02-08 MED ORDER — MIDAZOLAM HCL 2 MG/2ML IJ SOLN
INTRAMUSCULAR | Status: DC | PRN
  Administered 2015-02-08: 2 mg via INTRAVENOUS

## 2015-02-08 MED ORDER — PROPOFOL INFUSION 10 MG/ML OPTIME
INTRAVENOUS | Status: DC | PRN
  Administered 2015-02-08: 25 ug/kg/min via INTRAVENOUS

## 2015-02-08 MED ORDER — FAMOTIDINE 20 MG PO TABS
20.0000 mg | ORAL_TABLET | Freq: Once | ORAL | Status: AC
  Administered 2015-02-08: 20 mg via ORAL

## 2015-02-08 MED ORDER — FENTANYL CITRATE (PF) 100 MCG/2ML IJ SOLN: 25.0000 ug | INTRAMUSCULAR | Status: DC | PRN

## 2015-02-08 MED ORDER — ONDANSETRON HCL 4 MG/2ML IJ SOLN: 4.0000 mg | Freq: Once | INTRAMUSCULAR | Status: DC | PRN

## 2015-02-08 MED ORDER — LIDOCAINE HCL (CARDIAC) 20 MG/ML IV SOLN
INTRAVENOUS | Status: DC | PRN
  Administered 2015-02-08: 50 mg via INTRAVENOUS

## 2015-02-08 MED ORDER — LACTATED RINGERS IV SOLN
INTRAVENOUS | Status: DC
  Administered 2015-02-08 (×2): via INTRAVENOUS

## 2015-02-08 MED ORDER — SODIUM CHLORIDE 0.9 % IJ SOLN
INTRAMUSCULAR | Status: AC
  Filled 2015-02-08: qty 50

## 2015-02-08 MED ORDER — LIDOCAINE HCL (PF) 1 % IJ SOLN
INTRAMUSCULAR | Status: AC
  Filled 2015-02-08: qty 30

## 2015-02-08 MED ORDER — LIDOCAINE HCL (PF) 1 % IJ SOLN
INTRAMUSCULAR | Status: AC
  Filled 2015-02-08: qty 2

## 2015-02-08 MED ORDER — CEFAZOLIN SODIUM 1-5 GM-% IV SOLN
INTRAVENOUS | Status: AC
  Administered 2015-02-08: 1 g via INTRAVENOUS
  Filled 2015-02-08: qty 50

## 2015-02-08 MED ORDER — SODIUM CHLORIDE 0.9 % IR SOLN
Status: DC | PRN
  Administered 2015-02-08: 40 mL

## 2015-02-08 MED ORDER — ACETAMINOPHEN ER 650 MG PO TBCR: 650.0000 mg | EXTENDED_RELEASE_TABLET | Freq: Three times a day (TID) | ORAL | Status: DC | PRN

## 2015-02-08 SURGICAL SUPPLY — 33 items
BAG DECANTER STRL (MISCELLANEOUS) ×3 IMPLANT
BLADE SURG 15 STRL LF DISP TIS (BLADE) ×2 IMPLANT
BLADE SURG 15 STRL SS (BLADE) ×1
CABLE PACEMAKER EXTEN DISP (MISCELLANEOUS) ×3 IMPLANT
CANISTER SUCT 1200ML W/VALVE (MISCELLANEOUS) ×3 IMPLANT
CHLORAPREP W/TINT 26ML (MISCELLANEOUS) ×3 IMPLANT
COVER LIGHT HANDLE STERIS (MISCELLANEOUS) ×6 IMPLANT
COVER MAYO STAND STRL (DRAPES) ×3 IMPLANT
DRAPE C-ARM XRAY 36X54 (DRAPES) ×3 IMPLANT
DRAPE CAMERA CLOSED 9X96 (DRAPES) ×3 IMPLANT
DRESSING TELFA 4X3 1S ST N-ADH (GAUZE/BANDAGES/DRESSINGS) ×3 IMPLANT
DRSG TEGADERM 4X4.75 (GAUZE/BANDAGES/DRESSINGS) ×3 IMPLANT
DRSG TELFA 3X8 NADH (GAUZE/BANDAGES/DRESSINGS) ×3 IMPLANT
GENERATOR PACEMAKER (Pacemaker) ×3 IMPLANT
GLOVE BIO SURGEON STRL SZ7 (GLOVE) ×3 IMPLANT
GOWN STRL REUS W/ TWL LRG LVL3 (GOWN DISPOSABLE) ×2 IMPLANT
GOWN STRL REUS W/TWL LRG LVL3 (GOWN DISPOSABLE) ×1
IV NS 500ML (IV SOLUTION) ×1
IV NS 500ML BAXH (IV SOLUTION) ×2 IMPLANT
KIT RM TURNOVER STRD PROC AR (KITS) ×3 IMPLANT
KIT WRENCH PACEMAKER ASSEM (MISCELLANEOUS) ×3 IMPLANT
LABEL OR SOLS (LABEL) ×3 IMPLANT
MARKER SKIN W/RULER 31145785 (MISCELLANEOUS) ×3 IMPLANT
NDL SAFETY 25GX1.5 (NEEDLE) ×3 IMPLANT
NS IRRIG 500ML POUR BTL (IV SOLUTION) ×3 IMPLANT
PAD GROUND ADULT SPLIT (MISCELLANEOUS) ×3 IMPLANT
PAD STATPAD (MISCELLANEOUS) ×3 IMPLANT
SLING ARM LRG DEEP (SOFTGOODS) IMPLANT
STRIP CLOSURE SKIN 1/2X4 (GAUZE/BANDAGES/DRESSINGS) ×3 IMPLANT
SUT SILK 2 0 SH (SUTURE) ×3 IMPLANT
SUT SILK 3 0 (SUTURE) ×1
SUT SILK 3-0 18XBRD TIE 12 (SUTURE) ×2 IMPLANT
SUT SILK 4 0 SH (SUTURE) ×3 IMPLANT

## 2015-02-08 NOTE — Discharge Instructions (Signed)
Call for any pain , or bleeing

## 2015-02-08 NOTE — Brief Op Note (Signed)
02/08/2015  1:16 PM  PATIENT:  Samuel Mack  81 y.o. male  PRE-OPERATIVE DIAGNOSIS:  PACEMAKER GENERATOR CHANGE  POST-OPERATIVE DIAGNOSIS:  same  PROCEDURE:  Procedure(s): PACEMAKER GENERATOR CHANGE (Left)  SURGEON:  Surgeon(s) and Role:    * Cletis Athens, MD - Primary  PHYSICIAN ASSISTANT:   ASSISTANTS: none   ANESTHESIA:   local  EBL:  Total I/O In: 740 [P.O.:327; I.V.:400] Out: -   BLOOD ADMINISTERED:none  DRAINS: none   LOCAL MEDICATIONS USED:  LIDOCAINE   SPECIMEN:  No Specimen  DISPOSITION OF SPECIMEN:  N/A  COUNTS:  YES  TOURNIQUET:  * No tourniquets in log *  DICTATION: .Other Dictation: Dictation Number [id number]  PLAN OF CARE: Discharge to home after PACU  PATIENT DISPOSITION:  PACU - hemodynamically stable.   Delay start of Pharmacological VTE agent (>24hrs) due to surgical blood loss or risk of bleeding: not applicable

## 2015-02-08 NOTE — Transfer of Care (Signed)
Immediate Anesthesia Transfer of Care Note  Patient: Samuel Mack  Procedure(s) Performed: Procedure(s): PACEMAKER GENERATOR CHANGE (Left)  Patient Location: PACU  Anesthesia Type:MAC  Level of Consciousness: Alert, Awake, Oriented  Airway & Oxygen Therapy: Patient Spontanous Breathing  Post-op Assessment: Report given to RN  Post vital signs: Reviewed and stable  Last Vitals:  Filed Vitals:   02/08/15 1214  BP: 137/79  Pulse: 60  Temp: 36.2 C  Resp: 15    Complications: No apparent anesthesia complications

## 2015-02-08 NOTE — Anesthesia Postprocedure Evaluation (Signed)
  Anesthesia Post-op Note  Patient: Samuel Mack  Procedure(s) Performed: Procedure(s): PACEMAKER GENERATOR CHANGE (Left)  Anesthesia type:MAC  Patient location: PACU  Post pain: Pain level controlled  Post assessment: Post-op Vital signs reviewed, Patient's Cardiovascular Status Stable, Respiratory Function Stable, Patent Airway and No signs of Nausea or vomiting  Post vital signs: Reviewed and stable  Last Vitals:  Filed Vitals:   02/08/15 1214  BP: 137/79  Pulse: 60  Temp: 36.2 C  Resp: 15    Level of consciousness: awake, alert  and patient cooperative  Complications: No apparent anesthesia complications

## 2015-02-08 NOTE — H&P (Signed)
   History reviewed, patient examined, no change in status, stable for surgery.  

## 2015-02-08 NOTE — Anesthesia Preprocedure Evaluation (Signed)
Anesthesia Evaluation  Patient identified by MRN, date of birth, ID band Patient awake    Reviewed: Allergy & Precautions, NPO status   History of Anesthesia Complications (+) AWARENESS UNDER ANESTHESIA  Airway Mallampati: III       Dental  (+) Upper Dentures, Lower Dentures   Pulmonary asthma , former smoker,    + decreased breath sounds      Cardiovascular hypertension, + Past MI + dysrhythmias + pacemaker Rhythm:Irregular     Neuro/Psych negative neurological ROS  negative psych ROS   GI/Hepatic Neg liver ROS,   Endo/Other  Hypothyroidism   Renal/GU negative Renal ROS  negative genitourinary   Musculoskeletal negative musculoskeletal ROS (+)   Abdominal Normal abdominal exam  (+)   Peds negative pediatric ROS (+)  Hematology   Anesthesia Other Findings   Reproductive/Obstetrics negative OB ROS                             Anesthesia Physical Anesthesia Plan  ASA: III  Anesthesia Plan: MAC   Post-op Pain Management:    Induction: Intravenous  Airway Management Planned: Nasal Cannula  Additional Equipment:   Intra-op Plan:   Post-operative Plan:   Informed Consent: I have reviewed the patients History and Physical, chart, labs and discussed the procedure including the risks, benefits and alternatives for the proposed anesthesia with the patient or authorized representative who has indicated his/her understanding and acceptance.     Plan Discussed with: CRNA  Anesthesia Plan Comments:         Anesthesia Quick Evaluation

## 2015-02-09 NOTE — Op Note (Signed)
NAMELINKYN, GOBIN                ACCOUNT NO.:  [id number]  MEDICAL RECORD NO.:  [id number]  LOCATION:  [facility]                         FACILITY:  [facility]  PHYSICIAN:  Cletis Athens, MD       DATE OF BIRTH:  1930/09/01  DATE OF PROCEDURE:  02/08/2015 DATE OF DISCHARGE:  02/08/2015                              OPERATIVE REPORT   PROCEDURE PERFORMED:  Replacement of permanent pacemaker.  SURGEON:  Cletis Athens, MD  PREOPERATIVE DIAGNOSIS:  End of battery life.  POSTOPERATIVE DIAGNOSIS:  End of battery life.  IMPLANTING PHYSICIAN:  Cletis Athens, MD.  ACCESSORY CLINICAL DATA:  Pacemaker model Adapta ADDR01, serial #[id number].  DESCRIPTION OF PROCEDURE:  Patient was taken to the operating room and left upper chest was prepared with Hibiclens.  After draping the patient, 1% lidocaine was infiltrated on the previous pacemaker site. After putting the lidocaine in the subcutaneous tissue an incision was made on the skin overlying the deltopectoral groove.  Subcutaneous tissues were dissected out.  Pacemaker pocket was identified and pacemaker was opened.  Both leads were checked and they were found to be satisfactory.  Pacemaker cavity was washed with gentamicin solution. Next, a new pacemaker, Adapta, was selected, made by [employer] and it was connected to the pacemaker, making sure the atrial lead went into the atrial channel, ventricular lead went into the ventricular channel.  All connections were tightened securely and pacemaker was put in the pocket which was already created in the superficial fascia. Subcutaneous tissues and skin were closed separately.  Patient was returned to the recovery room in a stable condition.  Family was notified about the progress of the patient.          ______________________________ Cletis Athens, MD     JM/MEDQ  D:  02/08/2015  T:  02/08/2015  Job:  [id number]

## 2015-03-01 ENCOUNTER — Other Ambulatory Visit: Payer: Self-pay | Admitting: Internal Medicine

## 2015-04-18 DIAGNOSIS — E559 Vitamin D deficiency, unspecified: Secondary | ICD-10-CM | POA: Diagnosis not present

## 2015-04-18 DIAGNOSIS — E119 Type 2 diabetes mellitus without complications: Secondary | ICD-10-CM | POA: Diagnosis not present

## 2015-04-18 DIAGNOSIS — N401 Enlarged prostate with lower urinary tract symptoms: Secondary | ICD-10-CM | POA: Diagnosis not present

## 2015-04-18 DIAGNOSIS — I158 Other secondary hypertension: Secondary | ICD-10-CM | POA: Diagnosis not present

## 2015-04-18 DIAGNOSIS — E039 Hypothyroidism, unspecified: Secondary | ICD-10-CM | POA: Diagnosis not present

## 2015-05-15 DIAGNOSIS — E039 Hypothyroidism, unspecified: Secondary | ICD-10-CM | POA: Diagnosis not present

## 2015-06-16 DIAGNOSIS — E039 Hypothyroidism, unspecified: Secondary | ICD-10-CM | POA: Diagnosis not present

## 2015-07-24 DIAGNOSIS — I158 Other secondary hypertension: Secondary | ICD-10-CM | POA: Diagnosis not present

## 2015-07-24 DIAGNOSIS — E559 Vitamin D deficiency, unspecified: Secondary | ICD-10-CM | POA: Diagnosis not present

## 2015-07-24 DIAGNOSIS — Z125 Encounter for screening for malignant neoplasm of prostate: Secondary | ICD-10-CM | POA: Diagnosis not present

## 2015-07-24 DIAGNOSIS — D529 Folate deficiency anemia, unspecified: Secondary | ICD-10-CM | POA: Diagnosis not present

## 2015-07-24 DIAGNOSIS — D649 Anemia, unspecified: Secondary | ICD-10-CM | POA: Diagnosis not present

## 2015-07-24 DIAGNOSIS — E784 Other hyperlipidemia: Secondary | ICD-10-CM | POA: Diagnosis not present

## 2015-07-24 DIAGNOSIS — E039 Hypothyroidism, unspecified: Secondary | ICD-10-CM | POA: Diagnosis not present

## 2015-08-04 ENCOUNTER — Other Ambulatory Visit: Payer: Self-pay | Admitting: Internal Medicine

## 2015-08-04 DIAGNOSIS — R011 Cardiac murmur, unspecified: Secondary | ICD-10-CM

## 2015-08-15 ENCOUNTER — Ambulatory Visit (INDEPENDENT_AMBULATORY_CARE_PROVIDER_SITE_OTHER): Payer: MEDICARE

## 2015-08-15 ENCOUNTER — Other Ambulatory Visit: Payer: Self-pay

## 2015-08-15 DIAGNOSIS — R011 Cardiac murmur, unspecified: Secondary | ICD-10-CM | POA: Diagnosis not present

## 2015-08-17 DIAGNOSIS — H353130 Nonexudative age-related macular degeneration, bilateral, stage unspecified: Secondary | ICD-10-CM | POA: Diagnosis not present

## 2015-08-25 DIAGNOSIS — I509 Heart failure, unspecified: Secondary | ICD-10-CM | POA: Diagnosis not present

## 2015-08-25 DIAGNOSIS — E539 Vitamin B deficiency, unspecified: Secondary | ICD-10-CM | POA: Diagnosis not present

## 2015-10-24 DIAGNOSIS — E039 Hypothyroidism, unspecified: Secondary | ICD-10-CM | POA: Diagnosis not present

## 2015-10-24 DIAGNOSIS — N400 Enlarged prostate without lower urinary tract symptoms: Secondary | ICD-10-CM | POA: Diagnosis not present

## 2015-10-24 DIAGNOSIS — E119 Type 2 diabetes mellitus without complications: Secondary | ICD-10-CM | POA: Diagnosis not present

## 2015-10-24 DIAGNOSIS — N289 Disorder of kidney and ureter, unspecified: Secondary | ICD-10-CM | POA: Diagnosis not present

## 2015-10-24 DIAGNOSIS — N039 Chronic nephritic syndrome with unspecified morphologic changes: Secondary | ICD-10-CM | POA: Diagnosis not present

## 2015-12-04 DIAGNOSIS — Z85828 Personal history of other malignant neoplasm of skin: Secondary | ICD-10-CM | POA: Diagnosis not present

## 2015-12-04 DIAGNOSIS — D1800 Hemangioma unspecified site: Secondary | ICD-10-CM | POA: Diagnosis not present

## 2015-12-04 DIAGNOSIS — L578 Other skin changes due to chronic exposure to nonionizing radiation: Secondary | ICD-10-CM | POA: Diagnosis not present

## 2015-12-04 DIAGNOSIS — L821 Other seborrheic keratosis: Secondary | ICD-10-CM | POA: Diagnosis not present

## 2015-12-04 DIAGNOSIS — Z1283 Encounter for screening for malignant neoplasm of skin: Secondary | ICD-10-CM | POA: Diagnosis not present

## 2015-12-04 DIAGNOSIS — L812 Freckles: Secondary | ICD-10-CM | POA: Diagnosis not present

## 2015-12-04 DIAGNOSIS — D229 Melanocytic nevi, unspecified: Secondary | ICD-10-CM | POA: Diagnosis not present

## 2015-12-04 DIAGNOSIS — I8393 Asymptomatic varicose veins of bilateral lower extremities: Secondary | ICD-10-CM | POA: Diagnosis not present

## 2015-12-05 DIAGNOSIS — E039 Hypothyroidism, unspecified: Secondary | ICD-10-CM | POA: Diagnosis not present

## 2016-01-01 DIAGNOSIS — E876 Hypokalemia: Secondary | ICD-10-CM | POA: Diagnosis not present

## 2016-01-01 DIAGNOSIS — E039 Hypothyroidism, unspecified: Secondary | ICD-10-CM | POA: Diagnosis not present

## 2016-01-22 DIAGNOSIS — E039 Hypothyroidism, unspecified: Secondary | ICD-10-CM | POA: Diagnosis not present

## 2016-01-22 DIAGNOSIS — N289 Disorder of kidney and ureter, unspecified: Secondary | ICD-10-CM | POA: Diagnosis not present

## 2016-01-22 DIAGNOSIS — I158 Other secondary hypertension: Secondary | ICD-10-CM | POA: Diagnosis not present

## 2016-01-22 DIAGNOSIS — E119 Type 2 diabetes mellitus without complications: Secondary | ICD-10-CM | POA: Diagnosis not present

## 2016-01-22 DIAGNOSIS — N400 Enlarged prostate without lower urinary tract symptoms: Secondary | ICD-10-CM | POA: Diagnosis not present

## 2016-02-13 DIAGNOSIS — H35372 Puckering of macula, left eye: Secondary | ICD-10-CM | POA: Diagnosis not present

## 2016-04-23 DIAGNOSIS — N289 Disorder of kidney and ureter, unspecified: Secondary | ICD-10-CM | POA: Diagnosis not present

## 2016-04-23 DIAGNOSIS — E119 Type 2 diabetes mellitus without complications: Secondary | ICD-10-CM | POA: Diagnosis not present

## 2016-04-23 DIAGNOSIS — E039 Hypothyroidism, unspecified: Secondary | ICD-10-CM | POA: Diagnosis not present

## 2016-04-23 DIAGNOSIS — I158 Other secondary hypertension: Secondary | ICD-10-CM | POA: Diagnosis not present

## 2016-05-16 ENCOUNTER — Other Ambulatory Visit: Payer: Self-pay

## 2016-05-28 DIAGNOSIS — E039 Hypothyroidism, unspecified: Secondary | ICD-10-CM | POA: Diagnosis not present

## 2016-07-23 DIAGNOSIS — E784 Other hyperlipidemia: Secondary | ICD-10-CM | POA: Diagnosis not present

## 2016-07-23 DIAGNOSIS — E119 Type 2 diabetes mellitus without complications: Secondary | ICD-10-CM | POA: Diagnosis not present

## 2016-07-23 DIAGNOSIS — E559 Vitamin D deficiency, unspecified: Secondary | ICD-10-CM | POA: Diagnosis not present

## 2016-07-23 DIAGNOSIS — I158 Other secondary hypertension: Secondary | ICD-10-CM | POA: Diagnosis not present

## 2016-07-23 DIAGNOSIS — E039 Hypothyroidism, unspecified: Secondary | ICD-10-CM | POA: Diagnosis not present

## 2016-07-23 DIAGNOSIS — N400 Enlarged prostate without lower urinary tract symptoms: Secondary | ICD-10-CM | POA: Diagnosis not present

## 2016-08-13 DIAGNOSIS — H35372 Puckering of macula, left eye: Secondary | ICD-10-CM | POA: Diagnosis not present

## 2016-10-04 DIAGNOSIS — H35372 Puckering of macula, left eye: Secondary | ICD-10-CM | POA: Diagnosis not present

## 2016-10-29 DIAGNOSIS — E119 Type 2 diabetes mellitus without complications: Secondary | ICD-10-CM | POA: Diagnosis not present

## 2016-10-29 DIAGNOSIS — N400 Enlarged prostate without lower urinary tract symptoms: Secondary | ICD-10-CM | POA: Diagnosis not present

## 2016-10-29 DIAGNOSIS — M109 Gout, unspecified: Secondary | ICD-10-CM | POA: Diagnosis not present

## 2016-10-29 DIAGNOSIS — I10 Essential (primary) hypertension: Secondary | ICD-10-CM | POA: Diagnosis not present

## 2016-11-04 DIAGNOSIS — H2513 Age-related nuclear cataract, bilateral: Secondary | ICD-10-CM | POA: Diagnosis not present

## 2016-11-27 DIAGNOSIS — H2513 Age-related nuclear cataract, bilateral: Secondary | ICD-10-CM | POA: Diagnosis not present

## 2016-12-05 NOTE — Discharge Instructions (Signed)
Cataract Surgery, Care After °Refer to this sheet in the next few weeks. These instructions provide you with information about caring for yourself after your procedure. Your health care provider may also give you more specific instructions. Your treatment has been planned according to current medical practices, but problems sometimes occur. Call your health care provider if you have any problems or questions after your procedure. °What can I expect after the procedure? °After the procedure, it is common to have: °· Itching. °· Discomfort. °· Fluid discharge. °· Sensitivity to light and to touch. °· Bruising. °Follow these instructions at home: °Eye Care  °· Check your eye every day for signs of infection. Watch for: °¨ Redness, swelling, or pain. °¨ Fluid, blood, or pus. °¨ Warmth. °¨ Bad smell. °Activity  °· Avoid strenuous activities, such as playing contact sports, for as long as told by your health care provider. °· Do not drive or operate heavy machinery until your health care provider approves. °· Do not bend or lift heavy objects . Bending increases pressure in the eye. You can walk, climb stairs, and do light household chores. °· Ask your health care provider when you can return to work. If you work in a dusty environment, you may be advised to wear protective eyewear for a period of time. °General instructions  °· Take or apply over-the-counter and prescription medicines only as told by your health care provider. This includes eye drops. °· Do not touch or rub your eyes. °· If you were given a protective shield, wear it as told by your health care provider. If you were not given a protective shield, wear sunglasses as told by your health care provider to protect your eyes. °· Keep the area around your eye clean and dry. Avoid swimming or allowing water to hit you directly in the face while showering until told by your health care provider. Keep soap and shampoo out of your eyes. °· Do not put a contact lens  into the affected eye or eyes until your health care provider approves. °· Keep all follow-up visits as told by your health care provider. This is important. °Contact a health care provider if: ° °· You have increased bruising around your eye. °· You have pain that is not helped with medicine. °· You have a fever. °· You have redness, swelling, or pain in your eye. °· You have fluid, blood, or pus coming from your incision. °· Your vision gets worse. °Get help right away if: °· You have sudden vision loss. °This information is not intended to replace advice given to you by your health care provider. Make sure you discuss any questions you have with your health care provider. °Document Released: 03/22/2005 Document Revised: 01/11/2016 Document Reviewed: 07/13/2015 °Elsevier Interactive Patient Education © 2017 [employer] ° ° ° ° °General Anesthesia, Adult, Care After °These instructions provide you with information about caring for yourself after your procedure. Your health care provider may also give you more specific instructions. Your treatment has been planned according to current medical practices, but problems sometimes occur. Call your health care provider if you have any problems or questions after your procedure. °What can I expect after the procedure? °After the procedure, it is common to have: °· Vomiting. °· A sore throat. °· Mental slowness. °It is common to feel: °· Nauseous. °· Cold or shivery. °· Sleepy. °· Tired. °· Sore or achy, even in parts of your body where you did not have surgery. °Follow these instructions at   home: °For at least 24 hours after the procedure:  °· Do not: °¨ Participate in activities where you could fall or become injured. °¨ Drive. °¨ Use heavy machinery. °¨ Drink alcohol. °¨ Take sleeping pills or medicines that cause drowsiness. °¨ Make important decisions or sign legal documents. °¨ Take care of children on your own. °· Rest. °Eating and drinking  °· If you vomit, drink  water, juice, or soup when you can drink without vomiting. °· Drink enough fluid to keep your urine clear or pale yellow. °· Make sure you have little or no nausea before eating solid foods. °· Follow the diet recommended by your health care provider. °General instructions  °· Have a responsible adult stay with you until you are awake and alert. °· Return to your normal activities as told by your health care provider. Ask your health care provider what activities are safe for you. °· Take over-the-counter and prescription medicines only as told by your health care provider. °· If you smoke, do not smoke without supervision. °· Keep all follow-up visits as told by your health care provider. This is important. °Contact a health care provider if: °· You continue to have nausea or vomiting at home, and medicines are not helpful. °· You cannot drink fluids or start eating again. °· You cannot urinate after 8-12 hours. °· You develop a skin rash. °· You have fever. °· You have increasing redness at the site of your procedure. °Get help right away if: °· You have difficulty breathing. °· You have chest pain. °· You have unexpected bleeding. °· You feel that you are having a life-threatening or urgent problem. °This information is not intended to replace advice given to you by your health care provider. Make sure you discuss any questions you have with your health care provider. °Document Released: 12/09/2000 Document Revised: 02/05/2016 Document Reviewed: 08/17/2015 °Elsevier Interactive Patient Education © 2017 Elsevier Inc. ° °

## 2016-12-11 ENCOUNTER — Ambulatory Visit: Payer: MEDICARE | Admitting: Anesthesiology

## 2016-12-11 ENCOUNTER — Encounter
Admission: RE | Disposition: A | Discharge status: Home / Self Care | Payer: Self-pay | Source: Ambulatory Visit | Attending: Ophthalmology

## 2016-12-11 ENCOUNTER — Ambulatory Visit
Admission: RE | Admit: 2016-12-11 | Discharge: 2016-12-11 | Disposition: A | Discharge status: Home / Self Care | Payer: MEDICARE | Source: Ambulatory Visit | Attending: Ophthalmology | Admitting: Ophthalmology

## 2016-12-11 DIAGNOSIS — Z950 Presence of cardiac pacemaker: Secondary | ICD-10-CM | POA: Diagnosis not present

## 2016-12-11 DIAGNOSIS — E039 Hypothyroidism, unspecified: Secondary | ICD-10-CM | POA: Diagnosis not present

## 2016-12-11 DIAGNOSIS — I252 Old myocardial infarction: Secondary | ICD-10-CM | POA: Diagnosis not present

## 2016-12-11 DIAGNOSIS — E1136 Type 2 diabetes mellitus with diabetic cataract: Secondary | ICD-10-CM | POA: Diagnosis not present

## 2016-12-11 DIAGNOSIS — Z87891 Personal history of nicotine dependence: Secondary | ICD-10-CM | POA: Insufficient documentation

## 2016-12-11 DIAGNOSIS — I10 Essential (primary) hypertension: Secondary | ICD-10-CM | POA: Insufficient documentation

## 2016-12-11 DIAGNOSIS — H2512 Age-related nuclear cataract, left eye: Secondary | ICD-10-CM | POA: Diagnosis not present

## 2016-12-11 DIAGNOSIS — Z79899 Other long term (current) drug therapy: Secondary | ICD-10-CM | POA: Insufficient documentation

## 2016-12-11 DIAGNOSIS — H2513 Age-related nuclear cataract, bilateral: Secondary | ICD-10-CM | POA: Diagnosis not present

## 2016-12-11 HISTORY — DX: Dyspnea, unspecified: R06.00

## 2016-12-11 HISTORY — DX: Type 2 diabetes mellitus without complications: E11.9

## 2016-12-11 HISTORY — PX: CATARACT EXTRACTION W/PHACO: SHX586

## 2016-12-11 SURGERY — PHACOEMULSIFICATION, CATARACT, WITH IOL INSERTION
Anesthesia: Monitor Anesthesia Care | Laterality: Left | Wound class: Clean

## 2016-12-11 MED ORDER — ARMC OPHTHALMIC DILATING DROPS
1.0000 "application " | OPHTHALMIC | Status: DC | PRN
  Administered 2016-12-11 (×3): 1 via OPHTHALMIC

## 2016-12-11 MED ORDER — MOXIFLOXACIN HCL 0.5 % OP SOLN
OPHTHALMIC | Status: DC | PRN
  Administered 2016-12-11: .2 mL via OPHTHALMIC

## 2016-12-11 MED ORDER — TIMOLOL MALEATE 0.5 % OP SOLN
OPHTHALMIC | Status: DC | PRN
  Administered 2016-12-11: 1 [drp] via OPHTHALMIC

## 2016-12-11 MED ORDER — MIDAZOLAM HCL 2 MG/2ML IJ SOLN
INTRAMUSCULAR | Status: DC | PRN
  Administered 2016-12-11: 2 mg via INTRAVENOUS

## 2016-12-11 MED ORDER — EPINEPHRINE PF 1 MG/ML IJ SOLN
INTRAOCULAR | Status: DC | PRN
  Administered 2016-12-11: 67 mL via OPHTHALMIC

## 2016-12-11 MED ORDER — NA HYALUR & NA CHOND-NA HYALUR 0.4-0.35 ML IO KIT
PACK | INTRAOCULAR | Status: DC | PRN
  Administered 2016-12-11: 1 mL via INTRAOCULAR

## 2016-12-11 MED ORDER — BRIMONIDINE TARTRATE 0.2 % OP SOLN
OPHTHALMIC | Status: DC | PRN
  Administered 2016-12-11: 1 [drp] via OPHTHALMIC

## 2016-12-11 MED ORDER — FENTANYL CITRATE (PF) 100 MCG/2ML IJ SOLN
INTRAMUSCULAR | Status: DC | PRN
  Administered 2016-12-11: 50 ug via INTRAVENOUS

## 2016-12-11 MED ORDER — MOXIFLOXACIN HCL 0.5 % OP SOLN
1.0000 [drp] | OPHTHALMIC | Status: DC | PRN
  Administered 2016-12-11 (×3): 1 [drp] via OPHTHALMIC

## 2016-12-11 SURGICAL SUPPLY — 25 items
CANNULA ANT/CHMB 27GA (MISCELLANEOUS) ×3 IMPLANT
CARTRIDGE ABBOTT (MISCELLANEOUS) IMPLANT
GLOVE SURG LX 7.5 STRW (GLOVE) ×2
GLOVE SURG LX STRL 7.5 STRW (GLOVE) ×1 IMPLANT
GLOVE SURG TRIUMPH 8.0 PF LTX (GLOVE) ×3 IMPLANT
GOWN STRL REUS W/ TWL LRG LVL3 (GOWN DISPOSABLE) ×2 IMPLANT
GOWN STRL REUS W/TWL LRG LVL3 (GOWN DISPOSABLE) ×4
LENS IOL TECNIS ITEC 24.5 (Intraocular Lens) ×3 IMPLANT
MARKER SKIN DUAL TIP RULER LAB (MISCELLANEOUS) ×3 IMPLANT
NDL RETROBULBAR .5 NSTRL (NEEDLE) IMPLANT
NEEDLE FILTER BLUNT 18X 1/2SAF (NEEDLE) ×2
NEEDLE FILTER BLUNT 18X1 1/2 (NEEDLE) ×1 IMPLANT
PACK CATARACT BRASINGTON (MISCELLANEOUS) ×3 IMPLANT
PACK EYE AFTER SURG (MISCELLANEOUS) ×3 IMPLANT
PACK OPTHALMIC (MISCELLANEOUS) ×3 IMPLANT
RING MALYGIN 7.0 (MISCELLANEOUS) IMPLANT
SUT ETHILON 10-0 CS-B-6CS-B-6 (SUTURE)
SUT VICRYL  9 0 (SUTURE)
SUT VICRYL 9 0 (SUTURE) IMPLANT
SUTURE EHLN 10-0 CS-B-6CS-B-6 (SUTURE) IMPLANT
SYR 3ML LL SCALE MARK (SYRINGE) ×3 IMPLANT
SYR 5ML LL (SYRINGE) ×3 IMPLANT
SYR TB 1ML LUER SLIP (SYRINGE) ×3 IMPLANT
WATER STERILE IRR 250ML POUR (IV SOLUTION) ×3 IMPLANT
WIPE NON LINTING 3.25X3.25 (MISCELLANEOUS) ×3 IMPLANT

## 2016-12-11 NOTE — H&P (Signed)
The History and Physical notes are on paper, have been signed, and are to be scanned. The patient remains stable and unchanged from the H&P.   Previous H&P reviewed, patient examined, and there are no changes.  Samuel Mack 12/11/2016 8:44 AM

## 2016-12-11 NOTE — Anesthesia Procedure Notes (Signed)
Procedure Name: MAC Performed by: Kemauri Musa Pre-anesthesia Checklist: Patient identified, Emergency Drugs available, Suction available, Timeout performed and Patient being monitored Patient Re-evaluated:Patient Re-evaluated prior to inductionOxygen Delivery Method: Nasal cannula Placement Confirmation: positive ETCO2     

## 2016-12-11 NOTE — Transfer of Care (Signed)
Immediate Anesthesia Transfer of Care Note  Patient: Samuel Mack  Procedure(s) Performed: Procedure(s) with comments: CATARACT EXTRACTION PHACO AND INTRAOCULAR LENS PLACEMENT (IOC) (Left) - IVA TOPICAL LEFT  Patient Location: PACU  Anesthesia Type: MAC  Level of Consciousness: awake, alert  and patient cooperative  Airway and Oxygen Therapy: Patient Spontanous Breathing and Patient connected to supplemental oxygen  Post-op Assessment: Post-op Vital signs reviewed, Patient's Cardiovascular Status Stable, Respiratory Function Stable, Patent Airway and No signs of Nausea or vomiting  Post-op Vital Signs: Reviewed and stable  Complications: No apparent anesthesia complications

## 2016-12-11 NOTE — Op Note (Signed)
OPERATIVE NOTE  QUINLIN CONANT [id number] 12/11/2016   PREOPERATIVE DIAGNOSIS:  Nuclear sclerotic cataract left eye. H25.12   POSTOPERATIVE DIAGNOSIS:    Nuclear sclerotic cataract left eye.     PROCEDURE:  Phacoemusification with posterior chamber intraocular lens placement of the left eye   LENS:   Implant Name Type Inv. Item Serial No. Manufacturer Lot No. LRB No. Used  LENS IOL DIOP 24.5 - [id number] Intraocular Lens LENS IOL DIOP 24.5 [id number] AMO   Left 1        ULTRASOUND TIME: 18  % of 0 minutes 58 seconds, CDE 10.3  SURGEON:  Wyonia Hough, MD   ANESTHESIA:  Topical with tetracaine drops and 2% Xylocaine jelly, augmented with 1% preservative-free intracameral lidocaine.    COMPLICATIONS:  None.   DESCRIPTION OF PROCEDURE:  The patient was identified in the holding room and transported to the operating room and placed in the supine position under the operating microscope.  The left eye was identified as the operative eye and it was prepped and draped in the usual sterile ophthalmic fashion.   A 1 millimeter clear-corneal paracentesis was made at the 1:30 position.  0.5 ml of preservative-free 1% lidocaine was injected into the anterior chamber.  The anterior chamber was filled with Viscoat viscoelastic.  A 2.4 millimeter keratome was used to make a near-clear corneal incision at the 10:30 position.  .  A curvilinear capsulorrhexis was made with a cystotome and capsulorrhexis forceps.  Balanced salt solution was used to hydrodissect and hydrodelineate the nucleus.   Phacoemulsification was then used in stop and chop fashion to remove the lens nucleus and epinucleus.  The remaining cortex was then removed using the irrigation and aspiration handpiece. Provisc was then placed into the capsular bag to distend it for lens placement.  A lens was then injected into the capsular bag.  The remaining viscoelastic was aspirated.   Wounds were hydrated with balanced salt  solution.  The anterior chamber was inflated to a physiologic pressure with balanced salt solution.  No wound leaks were noted. Vigamox 0.2 ml of a 1mg  per ml solution was injected into the anterior chamber for a dose of 0.2 mg of intracameral antibiotic at the completion of the case.   Timolol and Brimonidine drops were applied to the eye.  The patient was taken to the recovery room in stable condition without complications of anesthesia or surgery.  Lamara Brecht 12/11/2016, 9:56 AM

## 2016-12-11 NOTE — Anesthesia Preprocedure Evaluation (Signed)
Anesthesia Evaluation    Airway Mallampati: III  TM Distance: >3 FB Neck ROM: Full    Dental   Pulmonary former smoker,    Pulmonary exam normal        Cardiovascular hypertension, + Past MI  Normal cardiovascular exam+ dysrhythmias + pacemaker      Neuro/Psych    GI/Hepatic   Endo/Other  diabetes, Well Controlled, Type 2Hypothyroidism   Renal/GU      Musculoskeletal   Abdominal   Peds  Hematology   Anesthesia Other Findings   Reproductive/Obstetrics                             Anesthesia Physical Anesthesia Plan  ASA: III  Anesthesia Plan: MAC   Post-op Pain Management:    Induction: Intravenous  Airway Management Planned:   Additional Equipment:   Intra-op Plan:   Post-operative Plan:   Informed Consent: I have reviewed the patients History and Physical, chart, labs and discussed the procedure including the risks, benefits and alternatives for the proposed anesthesia with the patient or authorized representative who has indicated his/her understanding and acceptance.     Plan Discussed with: CRNA  Anesthesia Plan Comments:         Anesthesia Quick Evaluation

## 2016-12-11 NOTE — Anesthesia Postprocedure Evaluation (Signed)
Anesthesia Post Note  Patient: Samuel Mack  Procedure(s) Performed: Procedure(s) (LRB): CATARACT EXTRACTION PHACO AND INTRAOCULAR LENS PLACEMENT (IOC) (Left)  Patient location during evaluation: PACU Anesthesia Type: MAC Level of consciousness: awake and alert Pain management: pain level controlled Vital Signs Assessment: post-procedure vital signs reviewed and stable Respiratory status: spontaneous breathing, nonlabored ventilation, respiratory function stable and patient connected to nasal cannula oxygen Cardiovascular status: stable and blood pressure returned to baseline Anesthetic complications: no    Marshell Levan

## 2016-12-12 ENCOUNTER — Encounter: Payer: Self-pay | Admitting: Ophthalmology

## 2017-01-20 DIAGNOSIS — H35372 Puckering of macula, left eye: Secondary | ICD-10-CM | POA: Diagnosis not present

## 2017-01-31 DIAGNOSIS — H35372 Puckering of macula, left eye: Secondary | ICD-10-CM | POA: Diagnosis not present

## 2017-02-04 DIAGNOSIS — E039 Hypothyroidism, unspecified: Secondary | ICD-10-CM | POA: Diagnosis not present

## 2017-02-04 DIAGNOSIS — E559 Vitamin D deficiency, unspecified: Secondary | ICD-10-CM | POA: Diagnosis not present

## 2017-02-04 DIAGNOSIS — N400 Enlarged prostate without lower urinary tract symptoms: Secondary | ICD-10-CM | POA: Diagnosis not present

## 2017-02-04 DIAGNOSIS — I10 Essential (primary) hypertension: Secondary | ICD-10-CM | POA: Diagnosis not present

## 2017-02-04 DIAGNOSIS — M109 Gout, unspecified: Secondary | ICD-10-CM | POA: Diagnosis not present

## 2017-02-04 DIAGNOSIS — E119 Type 2 diabetes mellitus without complications: Secondary | ICD-10-CM | POA: Diagnosis not present

## 2017-02-04 DIAGNOSIS — E781 Pure hyperglyceridemia: Secondary | ICD-10-CM | POA: Diagnosis not present

## 2017-02-04 DIAGNOSIS — I158 Other secondary hypertension: Secondary | ICD-10-CM | POA: Diagnosis not present

## 2017-02-04 DIAGNOSIS — N289 Disorder of kidney and ureter, unspecified: Secondary | ICD-10-CM | POA: Diagnosis not present

## 2017-03-10 DIAGNOSIS — Z950 Presence of cardiac pacemaker: Secondary | ICD-10-CM | POA: Diagnosis not present

## 2017-03-10 DIAGNOSIS — J449 Chronic obstructive pulmonary disease, unspecified: Secondary | ICD-10-CM | POA: Diagnosis not present

## 2017-03-10 DIAGNOSIS — H35372 Puckering of macula, left eye: Secondary | ICD-10-CM | POA: Diagnosis not present

## 2017-03-10 DIAGNOSIS — Z880 Allergy status to penicillin: Secondary | ICD-10-CM | POA: Diagnosis not present

## 2017-03-10 DIAGNOSIS — Z961 Presence of intraocular lens: Secondary | ICD-10-CM | POA: Diagnosis not present

## 2017-03-10 DIAGNOSIS — Z87891 Personal history of nicotine dependence: Secondary | ICD-10-CM | POA: Diagnosis not present

## 2017-03-10 DIAGNOSIS — H3589 Other specified retinal disorders: Secondary | ICD-10-CM | POA: Diagnosis not present

## 2017-03-10 DIAGNOSIS — R001 Bradycardia, unspecified: Secondary | ICD-10-CM | POA: Diagnosis not present

## 2017-03-10 DIAGNOSIS — I252 Old myocardial infarction: Secondary | ICD-10-CM | POA: Diagnosis not present

## 2017-05-02 DIAGNOSIS — H35372 Puckering of macula, left eye: Secondary | ICD-10-CM | POA: Diagnosis not present

## 2017-05-09 DIAGNOSIS — E119 Type 2 diabetes mellitus without complications: Secondary | ICD-10-CM | POA: Diagnosis not present

## 2017-05-09 DIAGNOSIS — I158 Other secondary hypertension: Secondary | ICD-10-CM | POA: Diagnosis not present

## 2017-05-09 DIAGNOSIS — J309 Allergic rhinitis, unspecified: Secondary | ICD-10-CM | POA: Diagnosis not present

## 2017-05-09 DIAGNOSIS — N400 Enlarged prostate without lower urinary tract symptoms: Secondary | ICD-10-CM | POA: Diagnosis not present

## 2017-05-09 DIAGNOSIS — N289 Disorder of kidney and ureter, unspecified: Secondary | ICD-10-CM | POA: Diagnosis not present

## 2017-05-09 DIAGNOSIS — E559 Vitamin D deficiency, unspecified: Secondary | ICD-10-CM | POA: Diagnosis not present

## 2017-05-09 DIAGNOSIS — E039 Hypothyroidism, unspecified: Secondary | ICD-10-CM | POA: Diagnosis not present

## 2017-06-23 DIAGNOSIS — Z23 Encounter for immunization: Secondary | ICD-10-CM | POA: Diagnosis not present

## 2017-07-21 DIAGNOSIS — H353131 Nonexudative age-related macular degeneration, bilateral, early dry stage: Secondary | ICD-10-CM | POA: Diagnosis not present

## 2017-08-12 DIAGNOSIS — I10 Essential (primary) hypertension: Secondary | ICD-10-CM | POA: Diagnosis not present

## 2017-08-12 DIAGNOSIS — R7989 Other specified abnormal findings of blood chemistry: Secondary | ICD-10-CM | POA: Diagnosis not present

## 2017-08-12 DIAGNOSIS — E785 Hyperlipidemia, unspecified: Secondary | ICD-10-CM | POA: Diagnosis not present

## 2017-08-12 DIAGNOSIS — E039 Hypothyroidism, unspecified: Secondary | ICD-10-CM | POA: Diagnosis not present

## 2017-08-15 DIAGNOSIS — R7889 Finding of other specified substances, not normally found in blood: Secondary | ICD-10-CM | POA: Diagnosis not present

## 2017-08-15 DIAGNOSIS — N289 Disorder of kidney and ureter, unspecified: Secondary | ICD-10-CM | POA: Diagnosis not present

## 2017-08-15 DIAGNOSIS — I158 Other secondary hypertension: Secondary | ICD-10-CM | POA: Diagnosis not present

## 2017-08-15 DIAGNOSIS — N400 Enlarged prostate without lower urinary tract symptoms: Secondary | ICD-10-CM | POA: Diagnosis not present

## 2017-08-15 DIAGNOSIS — E559 Vitamin D deficiency, unspecified: Secondary | ICD-10-CM | POA: Diagnosis not present

## 2017-08-15 DIAGNOSIS — E781 Pure hyperglyceridemia: Secondary | ICD-10-CM | POA: Diagnosis not present

## 2017-08-15 DIAGNOSIS — E039 Hypothyroidism, unspecified: Secondary | ICD-10-CM | POA: Diagnosis not present

## 2017-09-03 ENCOUNTER — Other Ambulatory Visit: Payer: Self-pay

## 2017-09-03 ENCOUNTER — Emergency Department: Payer: No Typology Code available for payment source

## 2017-09-03 ENCOUNTER — Emergency Department
Admission: EM | Admit: 2017-09-03 | Discharge: 2017-09-03 | Disposition: A | Discharge status: Home / Self Care | Payer: No Typology Code available for payment source | Attending: Emergency Medicine | Admitting: Emergency Medicine

## 2017-09-03 DIAGNOSIS — I252 Old myocardial infarction: Secondary | ICD-10-CM | POA: Insufficient documentation

## 2017-09-03 DIAGNOSIS — Y999 Unspecified external cause status: Secondary | ICD-10-CM | POA: Insufficient documentation

## 2017-09-03 DIAGNOSIS — Z7982 Long term (current) use of aspirin: Secondary | ICD-10-CM | POA: Diagnosis not present

## 2017-09-03 DIAGNOSIS — M25551 Pain in right hip: Secondary | ICD-10-CM | POA: Diagnosis not present

## 2017-09-03 DIAGNOSIS — I10 Essential (primary) hypertension: Secondary | ICD-10-CM | POA: Diagnosis not present

## 2017-09-03 DIAGNOSIS — J45909 Unspecified asthma, uncomplicated: Secondary | ICD-10-CM | POA: Insufficient documentation

## 2017-09-03 DIAGNOSIS — Z950 Presence of cardiac pacemaker: Secondary | ICD-10-CM | POA: Diagnosis not present

## 2017-09-03 DIAGNOSIS — S299XXA Unspecified injury of thorax, initial encounter: Secondary | ICD-10-CM | POA: Diagnosis not present

## 2017-09-03 DIAGNOSIS — E039 Hypothyroidism, unspecified: Secondary | ICD-10-CM | POA: Diagnosis not present

## 2017-09-03 DIAGNOSIS — S20211A Contusion of right front wall of thorax, initial encounter: Secondary | ICD-10-CM | POA: Insufficient documentation

## 2017-09-03 DIAGNOSIS — Y939 Activity, unspecified: Secondary | ICD-10-CM | POA: Diagnosis not present

## 2017-09-03 DIAGNOSIS — Y92410 Unspecified street and highway as the place of occurrence of the external cause: Secondary | ICD-10-CM | POA: Insufficient documentation

## 2017-09-03 DIAGNOSIS — R1011 Right upper quadrant pain: Secondary | ICD-10-CM | POA: Diagnosis not present

## 2017-09-03 DIAGNOSIS — E119 Type 2 diabetes mellitus without complications: Secondary | ICD-10-CM | POA: Insufficient documentation

## 2017-09-03 DIAGNOSIS — Z87891 Personal history of nicotine dependence: Secondary | ICD-10-CM | POA: Insufficient documentation

## 2017-09-03 DIAGNOSIS — Z79899 Other long term (current) drug therapy: Secondary | ICD-10-CM | POA: Diagnosis not present

## 2017-09-03 DIAGNOSIS — S29001A Unspecified injury of muscle and tendon of front wall of thorax, initial encounter: Secondary | ICD-10-CM | POA: Diagnosis present

## 2017-09-03 NOTE — ED Notes (Signed)
Family at bedside. 
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
## 2017-09-03 NOTE — ED Notes (Signed)
Pt returned from xray via stretcher.

## 2019-05-07 ENCOUNTER — Ambulatory Visit
Admission: RE | Admit: 2019-05-07 | Discharge: 2019-05-07 | Disposition: A | Discharge status: Home / Self Care | Payer: MEDICARE | Source: Ambulatory Visit | Attending: Internal Medicine | Admitting: Internal Medicine

## 2019-05-07 ENCOUNTER — Encounter
Admission: RE | Admit: 2019-05-07 | Discharge: 2019-05-07 | Disposition: A | Discharge status: Home / Self Care | Payer: MEDICARE | Source: Ambulatory Visit | Attending: Cardiology | Admitting: Cardiology

## 2019-05-07 ENCOUNTER — Other Ambulatory Visit: Payer: Self-pay | Admitting: Internal Medicine

## 2019-05-07 ENCOUNTER — Other Ambulatory Visit: Payer: Self-pay

## 2019-05-07 DIAGNOSIS — Z45010 Encounter for checking and testing of cardiac pacemaker pulse generator [battery]: Secondary | ICD-10-CM

## 2019-05-07 DIAGNOSIS — Z20828 Contact with and (suspected) exposure to other viral communicable diseases: Secondary | ICD-10-CM | POA: Insufficient documentation

## 2019-05-07 DIAGNOSIS — Z01818 Encounter for other preprocedural examination: Secondary | ICD-10-CM | POA: Insufficient documentation

## 2019-05-07 HISTORY — DX: Other complications of anesthesia, initial encounter: T88.59XA

## 2019-05-07 HISTORY — DX: Malignant (primary) neoplasm, unspecified: C80.1

## 2019-05-07 LAB — CBC
HCT: 41 % (ref 39.0–52.0)
Hemoglobin: 13.2 g/dL (ref 13.0–17.0)
MCH: 29.9 pg (ref 26.0–34.0)
MCHC: 32.2 g/dL (ref 30.0–36.0)
MCV: 93 fL (ref 80.0–100.0)
Platelets: 198 10*3/uL (ref 150–400)
RBC: 4.41 MIL/uL (ref 4.22–5.81)
RDW: 13.5 % (ref 11.5–15.5)
WBC: 12 10*3/uL — ABNORMAL HIGH (ref 4.0–10.5)
nRBC: 0 % (ref 0.0–0.2)

## 2019-05-07 LAB — BASIC METABOLIC PANEL
Anion gap: 6 (ref 5–15)
BUN: 28 mg/dL — ABNORMAL HIGH (ref 8–23)
CO2: 24 mmol/L (ref 22–32)
Calcium: 9.4 mg/dL (ref 8.9–10.3)
Chloride: 105 mmol/L (ref 98–111)
Creatinine, Ser: 1.52 mg/dL — ABNORMAL HIGH (ref 0.61–1.24)
GFR calc Af Amer: 47 mL/min — ABNORMAL LOW (ref >60)
GFR calc non Af Amer: 41 mL/min — ABNORMAL LOW (ref >60)
Glucose, Bld: 78 mg/dL (ref 70–99)
Potassium: 3.8 mmol/L (ref 3.5–5.1)
Sodium: 135 mmol/L (ref 135–145)

## 2019-05-07 LAB — SARS CORONAVIRUS 2 (TAT 6-24 HRS): SARS Coronavirus 2: NEGATIVE

## 2019-05-07 NOTE — H&P (View-Only) (Signed)
Med list updated

## 2019-05-07 NOTE — Patient Instructions (Signed)
Your procedure is scheduled on: Wednesday 05/12/19 Report to [place]. To find out your arrival time please call [phone] between 1PM - 3PM on Tuesday 05/11/19.  Remember: Instructions that are not followed completely may result in serious medical risk, up to and including death, or upon the discretion of your surgeon and anesthesiologist your surgery may need to be rescheduled.     _X__ 1. Do not eat food after midnight the night before your procedure.                 No gum chewing or hard candies. You may drink clear liquids up to 2 hours                 before you are scheduled to arrive for your surgery- DO not drink clear                 liquids within 2 hours of the start of your surgery.                 Clear Liquids include:  water, apple juice without pulp, clear carbohydrate                 drink such as Clearfast or Gatorade, Black Coffee or Tea (Do not add                 anything to coffee or tea). Diabetics water only  __X__2.  On the morning of surgery brush your teeth with toothpaste and water, you                 may rinse your mouth with mouthwash if you wish.  Do not swallow any              toothpaste of mouthwash.     _X__ 3.  No Alcohol for 24 hours before or after surgery.   _X__ 4.  Do Not Smoke or use e-cigarettes For 24 Hours Prior to Your Surgery.                 Do not use any chewable tobacco products for at least 6 hours prior to                 surgery.  ____  5.  Bring all medications with you on the day of surgery if instructed.   __X__  6.  Notify your doctor if there is any change in your medical condition      (cold, fever, infections).     Do not wear jewelry, make-up, hairpins, clips or nail polish. Do not wear lotions, powders, or perfumes.  Do not shave 48 hours prior to surgery. Men may shave face and neck. Do not bring valuables to the hospital.    [facility] is not responsible for  any belongings or valuables.  Contacts, dentures/partials or body piercings may not be worn into surgery. Bring a case for your contacts, glasses or hearing aids, a denture cup will be supplied. Leave your suitcase in the car. After surgery it may be brought to your room. For patients admitted to the hospital, discharge time is determined by your treatment team.   Patients discharged the day of surgery will not be allowed to drive home.   Please read over the following fact sheets that you were given:   MRSA Information  __X__ Take these medicines the morning of surgery with A SIP OF WATER:  1. diltiazem (DILACOR XR)   2. levothyroxine (SYNTHROID  3. nebivolol (BYSTOLIC  4.  5.  6.  ____ Fleet Enema (as directed)   __X__ Use CHG Soap/SAGE wipes as directed  ____ Use inhalers on the day of surgery  ____ Stop metformin/Janumet/Farxiga 2 days prior to surgery    ____ Take 1/2 of usual insulin dose the night before surgery. No insulin the morning          of surgery.   __X__ Stop Blood Thinners Coumadin/Plavix/Xarelto/Pleta/Pradaxa/Eliquis/Effient/Aspirin  on   Or contact your Surgeon, Cardiologist or Medical Doctor regarding  ability to stop your blood thinners  __X__ Stop Anti-inflammatories 7 days before surgery such as Advil, Ibuprofen, Motrin,  BC or Goodies Powder, Naprosyn, Naproxen, Aleve, Aspirin    __X__ Stop all herbal supplements, fish oil or vitamin E until after surgery. STOP TODAY   ____ Bring C-Pap to the hospital.

## 2019-05-07 NOTE — Progress Notes (Signed)
Med list updated

## 2019-05-07 NOTE — Pre-Procedure Instructions (Addendum)
Met B, CBC, and CXR results sent to Dr. Lavera Guise and Anesthesia for review.  Also faxed request for H&P to Dr. Lavera Guise.

## 2019-05-10 ENCOUNTER — Ambulatory Visit
Admission: RE | Admit: 2019-05-10 | Discharge: 2019-05-10 | Disposition: A | Discharge status: Home / Self Care | Payer: MEDICARE | Attending: Internal Medicine | Admitting: Internal Medicine

## 2019-05-10 ENCOUNTER — Ambulatory Visit
Admission: RE | Admit: 2019-05-10 | Discharge: 2019-05-10 | Disposition: A | Discharge status: Home / Self Care | Payer: MEDICARE | Source: Ambulatory Visit | Attending: Cardiology | Admitting: Cardiology

## 2019-05-10 ENCOUNTER — Other Ambulatory Visit: Payer: Self-pay

## 2019-05-10 ENCOUNTER — Other Ambulatory Visit: Payer: Self-pay | Admitting: Cardiology

## 2019-05-10 ENCOUNTER — Other Ambulatory Visit
Admission: RE | Admit: 2019-05-10 | Discharge: 2019-05-10 | Disposition: A | Discharge status: Home / Self Care | Payer: MEDICARE | Source: Home / Self Care | Attending: Internal Medicine | Admitting: Internal Medicine

## 2019-05-10 DIAGNOSIS — R0989 Other specified symptoms and signs involving the circulatory and respiratory systems: Secondary | ICD-10-CM

## 2019-05-10 LAB — COMPREHENSIVE METABOLIC PANEL
ALT: 22 U/L (ref 0–44)
AST: 23 U/L (ref 15–41)
Albumin: 3.8 g/dL (ref 3.5–5.0)
Alkaline Phosphatase: 69 U/L (ref 38–126)
Anion gap: 9 (ref 5–15)
BUN: 34 mg/dL — ABNORMAL HIGH (ref 8–23)
CO2: 20 mmol/L — ABNORMAL LOW (ref 22–32)
Calcium: 9.2 mg/dL (ref 8.9–10.3)
Chloride: 106 mmol/L (ref 98–111)
Creatinine, Ser: 1.78 mg/dL — ABNORMAL HIGH (ref 0.61–1.24)
GFR calc Af Amer: 39 mL/min — ABNORMAL LOW (ref >60)
GFR calc non Af Amer: 34 mL/min — ABNORMAL LOW (ref >60)
Glucose, Bld: 132 mg/dL — ABNORMAL HIGH (ref 70–99)
Potassium: 4 mmol/L (ref 3.5–5.1)
Sodium: 135 mmol/L (ref 135–145)
Total Bilirubin: 0.7 mg/dL (ref 0.3–1.2)
Total Protein: 6.9 g/dL (ref 6.5–8.1)

## 2019-05-10 LAB — CBC
HCT: 39.5 % (ref 39.0–52.0)
Hemoglobin: 12.8 g/dL — ABNORMAL LOW (ref 13.0–17.0)
MCH: 29.9 pg (ref 26.0–34.0)
MCHC: 32.4 g/dL (ref 30.0–36.0)
MCV: 92.3 fL (ref 80.0–100.0)
Platelets: 186 10*3/uL (ref 150–400)
RBC: 4.28 MIL/uL (ref 4.22–5.81)
RDW: 13.7 % (ref 11.5–15.5)
WBC: 10.7 10*3/uL — ABNORMAL HIGH (ref 4.0–10.5)
nRBC: 0 % (ref 0.0–0.2)

## 2019-05-12 ENCOUNTER — Encounter: Payer: Self-pay | Admitting: *Deleted

## 2019-05-12 ENCOUNTER — Ambulatory Visit
Admission: RE | Admit: 2019-05-12 | Discharge: 2019-05-12 | Disposition: A | Discharge status: Home / Self Care | Payer: MEDICARE | Attending: Cardiology | Admitting: Cardiology

## 2019-05-12 ENCOUNTER — Other Ambulatory Visit: Payer: Self-pay

## 2019-05-12 ENCOUNTER — Encounter
Admission: RE | Disposition: A | Discharge status: Home / Self Care | Payer: Self-pay | Source: Home / Self Care | Attending: Cardiology

## 2019-05-12 ENCOUNTER — Inpatient Hospital Stay: Payer: MEDICARE

## 2019-05-12 ENCOUNTER — Inpatient Hospital Stay: Payer: MEDICARE | Admitting: Anesthesiology

## 2019-05-12 ENCOUNTER — Other Ambulatory Visit: Payer: Self-pay | Admitting: Internal Medicine

## 2019-05-12 DIAGNOSIS — I10 Essential (primary) hypertension: Secondary | ICD-10-CM | POA: Diagnosis not present

## 2019-05-12 DIAGNOSIS — I252 Old myocardial infarction: Secondary | ICD-10-CM | POA: Insufficient documentation

## 2019-05-12 DIAGNOSIS — Z7982 Long term (current) use of aspirin: Secondary | ICD-10-CM | POA: Diagnosis not present

## 2019-05-12 DIAGNOSIS — E119 Type 2 diabetes mellitus without complications: Secondary | ICD-10-CM | POA: Diagnosis not present

## 2019-05-12 DIAGNOSIS — I4891 Unspecified atrial fibrillation: Secondary | ICD-10-CM | POA: Insufficient documentation

## 2019-05-12 DIAGNOSIS — I4811 Longstanding persistent atrial fibrillation: Secondary | ICD-10-CM

## 2019-05-12 DIAGNOSIS — Z79899 Other long term (current) drug therapy: Secondary | ICD-10-CM | POA: Insufficient documentation

## 2019-05-12 DIAGNOSIS — Z87891 Personal history of nicotine dependence: Secondary | ICD-10-CM | POA: Insufficient documentation

## 2019-05-12 DIAGNOSIS — E039 Hypothyroidism, unspecified: Secondary | ICD-10-CM | POA: Insufficient documentation

## 2019-05-12 DIAGNOSIS — Z79890 Hormone replacement therapy (postmenopausal): Secondary | ICD-10-CM | POA: Diagnosis not present

## 2019-05-12 DIAGNOSIS — Z880 Allergy status to penicillin: Secondary | ICD-10-CM | POA: Insufficient documentation

## 2019-05-12 DIAGNOSIS — J45909 Unspecified asthma, uncomplicated: Secondary | ICD-10-CM | POA: Diagnosis not present

## 2019-05-12 HISTORY — PX: PACEMAKER INSERTION: SHX728

## 2019-05-12 LAB — GLUCOSE, CAPILLARY
Glucose-Capillary: 72 mg/dL (ref 70–99)
Glucose-Capillary: 73 mg/dL (ref 70–99)

## 2019-05-12 SURGERY — INSERTION, CARDIAC PACEMAKER
Anesthesia: General | Laterality: Left

## 2019-05-12 MED ORDER — SODIUM CHLORIDE 0.9 % IR SOLN
Status: DC | PRN
  Administered 2019-05-12: 50 mL

## 2019-05-12 MED ORDER — SODIUM CHLORIDE 0.9 % IV SOLN
INTRAVENOUS | Status: DC
  Administered 2019-05-12: 11:00:00 via INTRAVENOUS

## 2019-05-12 MED ORDER — ONDANSETRON HCL 4 MG/2ML IJ SOLN
INTRAMUSCULAR | Status: AC
  Filled 2019-05-12: qty 2

## 2019-05-12 MED ORDER — CEFAZOLIN SODIUM-DEXTROSE 1-4 GM/50ML-% IV SOLN
1.0000 g | Freq: Once | INTRAVENOUS | Status: AC
  Administered 2019-05-12: 12:00:00 1 g via INTRAVENOUS

## 2019-05-12 MED ORDER — PROPOFOL 10 MG/ML IV BOLUS
INTRAVENOUS | Status: AC
  Filled 2019-05-12: qty 40

## 2019-05-12 MED ORDER — PROPOFOL 500 MG/50ML IV EMUL
INTRAVENOUS | Status: DC | PRN
  Administered 2019-05-12: 40 ug/kg/min via INTRAVENOUS

## 2019-05-12 MED ORDER — SODIUM CHLORIDE FLUSH 0.9 % IV SOLN
INTRAVENOUS | Status: AC
  Filled 2019-05-12: qty 20

## 2019-05-12 MED ORDER — FAMOTIDINE 20 MG PO TABS
ORAL_TABLET | ORAL | Status: AC
  Administered 2019-05-12: 20 mg via ORAL
  Filled 2019-05-12: qty 1

## 2019-05-12 MED ORDER — PROPOFOL 10 MG/ML IV BOLUS
INTRAVENOUS | Status: DC | PRN
  Administered 2019-05-12 (×2): 20 mg via INTRAVENOUS

## 2019-05-12 MED ORDER — FAMOTIDINE 20 MG PO TABS
20.0000 mg | ORAL_TABLET | Freq: Once | ORAL | Status: AC
  Administered 2019-05-12: 11:00:00 20 mg via ORAL

## 2019-05-12 MED ORDER — LIDOCAINE HCL (PF) 1 % IJ SOLN
INTRAMUSCULAR | Status: DC | PRN
  Administered 2019-05-12: 15 mL

## 2019-05-12 MED ORDER — FENTANYL CITRATE (PF) 100 MCG/2ML IJ SOLN: 25.0000 ug | INTRAMUSCULAR | Status: DC | PRN

## 2019-05-12 MED ORDER — ONDANSETRON HCL 4 MG/2ML IJ SOLN
INTRAMUSCULAR | Status: DC | PRN
  Administered 2019-05-12: 4 mg via INTRAVENOUS

## 2019-05-12 MED ORDER — GENTAMICIN SULFATE 40 MG/ML IJ SOLN
INTRAMUSCULAR | Status: AC
  Filled 2019-05-12: qty 2

## 2019-05-12 MED ORDER — PHENYLEPHRINE HCL (PRESSORS) 10 MG/ML IV SOLN
INTRAVENOUS | Status: DC | PRN
  Administered 2019-05-12 (×2): 150 ug via INTRAVENOUS
  Administered 2019-05-12: 100 ug via INTRAVENOUS
  Administered 2019-05-12: 150 ug via INTRAVENOUS
  Administered 2019-05-12: 100 ug via INTRAVENOUS
  Administered 2019-05-12 (×2): 150 ug via INTRAVENOUS
  Administered 2019-05-12 (×2): 50 ug via INTRAVENOUS

## 2019-05-12 MED ORDER — FENTANYL CITRATE (PF) 100 MCG/2ML IJ SOLN
INTRAMUSCULAR | Status: AC
  Filled 2019-05-12: qty 2

## 2019-05-12 MED ORDER — FENTANYL CITRATE (PF) 100 MCG/2ML IJ SOLN
INTRAMUSCULAR | Status: DC | PRN
  Administered 2019-05-12: 25 ug via INTRAVENOUS

## 2019-05-12 MED ORDER — CEFAZOLIN SODIUM-DEXTROSE 1-4 GM/50ML-% IV SOLN
INTRAVENOUS | Status: AC
  Filled 2019-05-12: qty 50

## 2019-05-12 MED ORDER — ONDANSETRON HCL 4 MG/2ML IJ SOLN: 4.0000 mg | Freq: Once | INTRAMUSCULAR | Status: DC | PRN

## 2019-05-12 SURGICAL SUPPLY — 38 items
BLADE SURG 15 STRL LF DISP TIS (BLADE) ×1 IMPLANT
BLADE SURG 15 STRL SS (BLADE) ×1
CABLE SURG 12 DISP A/V CHANNEL (MISCELLANEOUS) ×4 IMPLANT
CANISTER SUCT 1200ML W/VALVE (MISCELLANEOUS) ×2 IMPLANT
CHLORAPREP W/TINT 26 (MISCELLANEOUS) ×2 IMPLANT
COVER LIGHT HANDLE STERIS (MISCELLANEOUS) ×4 IMPLANT
COVER MAYO STAND REUSABLE (DRAPES) ×2 IMPLANT
DRAPE C-ARM XRAY 36X54 (DRAPES) ×2 IMPLANT
DRSG TEGADERM 4X4.75 (GAUZE/BANDAGES/DRESSINGS) ×4 IMPLANT
DRSG TELFA 4X3 1S NADH ST (GAUZE/BANDAGES/DRESSINGS) ×4 IMPLANT
ELECT REM PT RETURN 9FT ADLT (ELECTROSURGICAL) ×2
ELECTRODE REM PT RTRN 9FT ADLT (ELECTROSURGICAL) ×1 IMPLANT
GLOVE BIO SURGEON STRL SZ7 (GLOVE) ×2 IMPLANT
GOWN STRL REUS W/ TWL LRG LVL3 (GOWN DISPOSABLE) ×2 IMPLANT
GOWN STRL REUS W/TWL LRG LVL3 (GOWN DISPOSABLE) ×2
IMMOBILIZER SHDR LG LX 900803 (SOFTGOODS) IMPLANT
INTRO PACEMAKR LEAD 7FR 23CM (INTRODUCER)
INTRO PACEMAKR LEAD 9FR 23CM (INTRODUCER)
INTRODUCER PACEMKR LD 7FR 23CM (INTRODUCER) IMPLANT
INTRODUCER PACEMKR LD 9FR 23CM (INTRODUCER) IMPLANT
IPG PACE AZUR XT SR MRI W1SR01 (Pacemaker) ×1 IMPLANT
KIT TURNOVER KIT A (KITS) ×2 IMPLANT
KIT WRENCH (KITS) ×2 IMPLANT
LABEL OR SOLS (LABEL) ×2 IMPLANT
MARKER SKIN DUAL TIP RULER LAB (MISCELLANEOUS) ×2 IMPLANT
NEEDLE HYPO 25X1 1.5 SAFETY (NEEDLE) ×2 IMPLANT
NS IRRIG 500ML POUR BTL (IV SOLUTION) ×2 IMPLANT
PACE AZURE XT SR MRI W1SR01 (Pacemaker) ×2 IMPLANT
PACK PACE INSERTION (MISCELLANEOUS) ×2 IMPLANT
SUCTION FRAZIER HANDLE 10FR (MISCELLANEOUS) ×1
SUCTION TUBE FRAZIER 10FR DISP (MISCELLANEOUS) ×1 IMPLANT
SUT SILK 2 0 SH (SUTURE) ×4 IMPLANT
SUT SILK 3 0 (SUTURE) ×1
SUT SILK 3-0 (SUTURE) ×2 IMPLANT
SUT SILK 3-0 18XBRD TIE 12 (SUTURE) ×1 IMPLANT
SUT SILK 4 0 SH (SUTURE) ×2 IMPLANT
SYR 5ML 18GX1 1/2 (NEEDLE) ×2 IMPLANT
medtronic sterile sleeve ×2 IMPLANT

## 2019-05-12 NOTE — Interval H&P Note (Signed)
History and Physical Interval Note:  05/12/2019 10:51 AM  Samuel Mack  has presented today for surgery, with the diagnosis of I44.2 PACEMAKER CHANGE OUT.  The various methods of treatment have been discussed with the patient and family. After consideration of risks, benefits and other options for treatment, the patient has consented to  Procedure(s): INSERTION PACEMAKER, LEFT (Left) as a surgical intervention.  The patient's history has been reviewed, patient examined, no change in status, stable for surgery.  I have reviewed the patient's chart and labs.  Questions were answered to the patient's satisfaction.  Pt xray shows 2 ventricular leads/ no chf. No change in physical exam   [facility]

## 2019-05-12 NOTE — Transfer of Care (Signed)
Immediate Anesthesia Transfer of Care Note  Patient: Samuel Mack  Procedure(s) Performed: Procedure(s): INSERTION PACEMAKER, LEFT (Left)  Patient Location: PACU  Anesthesia Type:General  Level of Consciousness: drowsy  Airway & Oxygen Therapy: Patient Spontanous Breathing and Patient connected to face mask oxygen  Post-op Assessment: Report given to RN and Post -op Vital signs reviewed and stable  Post vital signs: Reviewed and stable  Last Vitals:  Vitals:   05/12/19 1025 05/12/19 1343  BP: (!) 163/78 125/74  Pulse: 64 70  Resp: 16 11  Temp: (!) 36.3 C (!) 36.2 C  SpO2: 123XX123 [id number]    Complications: No apparent anesthesia complications

## 2019-05-12 NOTE — Progress Notes (Deleted)
Preoperative diagnosis  Atrial fib with bradycardia Postoperative diagnosis same  Procedure: Generator replacement    Following informed consent the patient was brought to the operating room and placed in the supine position.  After routine prep and drape lidocaine was infiltrated in the region of the previous incision and carried down to later the device pocket using sharp dissection  The pocket was opened the device was freed up and was explanted.  Interrogation of the previously implanted ventricular lead   demonstrated an R wave of 3.0  millivolts., and impedance of 380 ohms, and a pacing threshold of..1.25.volts at VVIR mode.    The previously implanted atrial lead  Was capped  The leads were inspected. The lead were then attached to a AzureXT pulse generator, serial number [id number]                                                                        H.    The pocket was irrigated with antibiotic containing saline solution hemostasis was assured and the leads and the device were placed in the pocket. The wound was then closed in 2 layers in normal fashion.  Dermabond was applied.  EBL minimal 7cc  The patient tolerated the procedure without apparent complication.  Patient was sent to recovery room in stable condition.  Family was notified about patient condition.  [employer]

## 2019-05-12 NOTE — Anesthesia Preprocedure Evaluation (Signed)
Anesthesia Evaluation  Patient identified by MRN, date of birth, ID band Patient awake    Reviewed: Allergy & Precautions, NPO status , Patient's Chart, lab work & pertinent test results  History of Anesthesia Complications (+) AWARENESS UNDER ANESTHESIA and history of anesthetic complications  Airway Mallampati: III       Dental   Pulmonary asthma , neg sleep apnea, former smoker,           Cardiovascular hypertension, Pt. on medications + Past MI  + dysrhythmias + pacemaker      Neuro/Psych neg Seizures    GI/Hepatic Neg liver ROS, neg GERD  ,  Endo/Other  diabetes, Type 2, Oral Hypoglycemic AgentsHypothyroidism   Renal/GU negative Renal ROS     Musculoskeletal   Abdominal   Peds  Hematology   Anesthesia Other Findings   Reproductive/Obstetrics                             Anesthesia Physical Anesthesia Plan  ASA: III  Anesthesia Plan: General   Post-op Pain Management:    Induction: Intravenous  PONV Risk Score and Plan: 2 and Propofol infusion and TIVA  Airway Management Planned: Nasal Cannula  Additional Equipment:   Intra-op Plan:   Post-operative Plan:   Informed Consent: I have reviewed the patients History and Physical, chart, labs and discussed the procedure including the risks, benefits and alternatives for the proposed anesthesia with the patient or authorized representative who has indicated his/her understanding and acceptance.       Plan Discussed with:   Anesthesia Plan Comments:         Anesthesia Quick Evaluation

## 2019-05-12 NOTE — Anesthesia Post-op Follow-up Note (Signed)
Anesthesia QCDR form completed.        

## 2019-05-12 NOTE — Op Note (Signed)
Preoperative diagnosis  Atrial fib with bradycardia Postoperative diagnosis same  Procedure: Generator replacement    Following informed consent the patient was brought to the operating room and placed in the supine position.  After routine prep and drape lidocaine was infiltrated in the region of the previous incision and carried down to later the device pocket using sharp dissection  The pocket was opened the device was freed up and was explanted.  Interrogation of the previously implanted ventricular lead   demonstrated an R wave of 3.0  millivolts., and impedance of 380 ohms, and a pacing threshold of..1.25.volts at VVIR mode.    The previously implanted atrial lead  Was capped  The leads were inspected. The lead were then attached to a AzureXT pulse generator, serial number [id number]                                                                        H.    The pocket was irrigated with antibiotic containing saline solution hemostasis was assured and the leads and the device were placed in the pocket. The wound was then closed in 2 layers in normal fashion.  Dermabond was applied.  EBL minimal 7cc  The patient tolerated the procedure without apparent complication.  Patient was sent to recovery room in stable condition.  Family was notified about patient condition.  [employer]

## 2019-05-12 NOTE — Discharge Instructions (Signed)
AMBULATORY SURGERY  DISCHARGE INSTRUCTIONS   1) The drugs that you were given will stay in your system until tomorrow so for the next 24 hours you should not:  A) Drive an automobile B) Make any legal decisions C) Drink any alcoholic beverage   2) You may resume regular meals tomorrow.  Today it is better to start with liquids and gradually work up to solid foods.  You may eat anything you prefer, but it is better to start with liquids, then soup and crackers, and gradually work up to solid foods.   3) Please notify your doctor immediately if you have any unusual bleeding, trouble breathing, redness and pain at the surgery site, drainage, fever, or pain not relieved by medication. 4)   5) Your post-operative visit with Dr.                                     is: Date:                        Time:    Please call to schedule your post-operative visit.  6) Additional Instructions:     Call for fever , chest pain or bleeding.

## 2019-05-14 ENCOUNTER — Encounter: Payer: Self-pay | Admitting: Cardiology

## 2019-05-14 NOTE — Anesthesia Postprocedure Evaluation (Signed)
Anesthesia Post Note  Patient: Samuel Mack  Procedure(s) Performed: INSERTION PACEMAKER, LEFT (Left )  Patient location during evaluation: PACU Anesthesia Type: General Level of consciousness: awake and alert and oriented Pain management: pain level controlled Vital Signs Assessment: post-procedure vital signs reviewed and stable Respiratory status: spontaneous breathing Cardiovascular status: blood pressure returned to baseline Anesthetic complications: no     Last Vitals:  Vitals:   05/12/19 1447 05/12/19 1510  BP: (!) 149/79 (!) 148/91  Pulse: 70 70  Resp: 16 16  Temp: (!) 36.3 C   SpO2: 100% 100%    Last Pain:  Vitals:   05/12/19 1510  TempSrc:   PainSc: 0-No pain                 Torrence Branagan

## 2020-01-06 ENCOUNTER — Ambulatory Visit: Payer: MEDICARE | Attending: Neurology

## 2020-01-06 ENCOUNTER — Other Ambulatory Visit: Payer: Self-pay

## 2020-01-06 DIAGNOSIS — R2681 Unsteadiness on feet: Secondary | ICD-10-CM | POA: Diagnosis present

## 2020-01-06 DIAGNOSIS — Z9181 History of falling: Secondary | ICD-10-CM | POA: Insufficient documentation

## 2020-01-06 NOTE — Therapy (Signed)
[place] MAIN [facility] SERVICES [address] [place], [place], [address] Phone: [phone]   Fax:  [phone]  Physical Therapy Evaluation  Patient Details  Name: Samuel Mack MRN: [id number] Date of Birth: 84/13/1931 Referring Provider (PT): Dr. Manuella Ghazi   Encounter Date: 01/06/2020  PT End of Session - 01/07/20 1211    Visit Number  1    Number of Visits  25    Date for PT Re-Evaluation  03/30/20    Authorization Type  eval: 01/06/20    PT Start Time  1515    PT Stop Time  1615    PT Time Calculation (min)  60 min    Equipment Utilized During Treatment  Gait belt    Activity Tolerance  Patient tolerated treatment well    Behavior During Therapy  [facility] for tasks assessed/performed       Past Medical History:  Diagnosis Date  . Asthma   . Cancer ([place])    skin  . Complication of anesthesia    previous pacemaker patient could feel everything  . Diabetes mellitus without complication (HCC)    diet controlled  . Dyspnea   . Dysrhythmia   . Elevated lipids   . Hypertension   . Hypothyroidism   . Myocardial infarction ([place])   . Presence of permanent cardiac pacemaker     Past Surgical History:  Procedure Laterality Date  . CATARACT EXTRACTION W/PHACO Left 12/11/2016   Procedure: CATARACT EXTRACTION PHACO AND INTRAOCULAR LENS PLACEMENT (IOC);  Surgeon: Leandrew Koyanagi, MD;  Location: [place];  Service: Ophthalmology;  Laterality: Left;  IVA TOPICAL LEFT  . EYE SURGERY Left    cataract  . NASAL SINUS SURGERY    . PACEMAKER GENERATOR CHANGE Left    x2  . PACEMAKER INSERTION    . PACEMAKER INSERTION Left 05/12/2019   Procedure: INSERTION PACEMAKER, LEFT;  Surgeon: Cletis Athens, MD;  Location: [facility];  Service: Cardiovascular;  Laterality: Left;  . PROSTATE SURGERY      There were no vitals filed for this visit.   Subjective Assessment - 01/06/20 1531    Subjective  Unsteadiness    Pertinent History  Pt referred  for difficulty with balance. Pt and daughter report that his difficulty with balance is progressive over the period of multiple years. Daughter states that patient's overall function has declined since his wife passed approximately 7 years ago. Per neurology note pt with possible peripheral neuropathy but currently holding off on NCS. Per daughter all labwork ordered at last visit was normal (CBC, CMP, TSH, Vit B12, Vit D). Pt has a pacemaker and reports that it is functioning well per cardiology. He states that he has had it replaced multiple times and was advised not to lift his arms over his head or lay on his chest due to concerns that he may dislodge the leads.    Limitations  Walking    Diagnostic tests  No head CT in chart, can't get brain MRI due to pacemaker    Patient Stated Goals  Improve balance and decrease risk for falls    Currently in Pain?  No/denies         [facility] PT Assessment - 01/06/20 1521      Assessment   Medical Diagnosis  Imbalance    Referring Provider (PT)  Dr. Manuella Ghazi    Onset Date/Surgical Date  01/05/18   Approximate   Hand Dominance  Right    Next MD Visit  With neurologist in couple months    Prior Therapy  None      Precautions   Precautions  Fall      Restrictions   Weight Bearing Restrictions  No      Balance Screen   Has the patient fallen in the past 6 months  No    Has the patient had a decrease in activity level because of a fear of falling?   No    Is the patient reluctant to leave their home because of a fear of falling?   No      Home Social worker  Private residence    Living Arrangements  Alone    Available Help at Discharge  Family    Type of Home  Other(Comment)   [place] to enter    Entrance Stairs-Number of Steps  5    Entrance Stairs-Rails  Right;Left;Can reach both    [place]  One level    [place] - 2 wheels;Cane - single point;Shower seat;Other (comment)   No grab bars      Prior Function   Level of Independence  Independent    Vocation  Retired    Leisure  Reading, plays Retail banker   Overall Cognitive Status  Within Functional Limits for tasks assessed      Standardized Balance Assessment   Standardized Balance Assessment  Berg Balance Test      Berg Balance Test   Sit to Stand  Able to stand without using hands and stabilize independently    Standing Unsupported  Able to stand safely 2 minutes    Sitting with Back Unsupported but Feet Supported on Floor or Stool  Able to sit safely and securely 2 minutes    Stand to Sit  Sits safely with minimal use of hands    Transfers  Able to transfer safely, minor use of hands    Standing Unsupported with Eyes Closed  Able to stand 10 seconds with supervision    Standing Unsupported with Feet Together  Able to place feet together independently and stand 1 minute safely    From Standing, Reach Forward with Outstretched Arm  Can reach forward >12 cm safely (5")    From Standing Position, Pick up Object from Floor  Able to pick up shoe, needs supervision    From Standing Position, Turn to Look Behind Over each Shoulder  Looks behind from both sides and weight shifts well    Turn 360 Degrees  Able to turn 360 degrees safely one side only in 4 seconds or less    Standing Unsupported, Alternately Place Feet on Step/Stool  Able to stand independently and safely and complete 8 steps in 20 seconds    Standing Unsupported, One Foot in Front  Able to plae foot ahead of the other independently and hold 30 seconds    Standing on One Leg  Tries to lift leg/unable to hold 3 seconds but remains standing independently    Total Score  48        SUBJECTIVE Chief complaint: Onset: Pt referred for difficulty with balance. Pt and daughter report that his difficulty with balance is progressive over the period of multiple years. Daughter states that patient's overall function has declined since his wife passed  approximately 7 years ago. Per neurology note pt with possible peripheral neuropathy but currently holding off on NCS. Per daughter all labwork  ordered at last visit was normal (CBC, CMP, TSH, Vit B12, Vit D). Pt has a pacemaker and reports that it is functioning well per cardiology. He states that he has had it replaced multiple times and was advised not to lift his arms over his head or lay on his chest due to concerns that he may dislodge the leads. Imaging: No evidence of head CT in records (put unable to get MRI due to pacemaker) Recent changes in overall health/medication: No Directional pattern for falls: None Prior history of physical therapy for balance: None Follow-up appointment with MD: None scheduled Red flags (bowel/bladder changes, saddle paresthesia, personal history of cancer, chills/fever, night sweats, unrelenting pain) Negative  OBJECTIVE  MUSCULOSKELETAL: Tremor: Bilateral UE essential tremor Bulk: Normal Tone: Normal, no clonus  Posture No gross abnormalities noted in standing or seated posture  Gait Pt with wide BOS with mildly ataxic stepping and instability noted  Strength R/L 4+/4+ Hip flexion 5/5 Hip external rotation 5/5 Hip internal rotation 5/5 Hip abduction 5/5 Hip adduction 5/5 Knee extension 5/5 Knee flexion Active Ankle Plantarflexion 5/5 Ankle Dorsiflexion   NEUROLOGICAL:  Mental Status Patient is oriented to person, place and time.  Recent memory is intact.  Remote memory is intact.  Attention span and concentration are intact.  Expressive speech is intact.  Patient's fund of knowledge is within normal limits for educational level.  Sensation Grossly intact to light touch bilateral LEs as determined by testing dermatomes L2-S2 respectively Proprioception and hot/cold testing deferred on this date  Reflexes Deferred  Coordination/Cerebellar Finger to Nose: WNL with the exception of mild tremor bilaterally Heel to Shin: WNL Rapid  alternating movements: WNL Finger Opposition: WNL Pronator Drift: Negative  FUNCTIONAL OUTCOME MEASURES   Results Comments  BERG 48/56 Fall risk, in need of intervention  TUG 11.0 seconds WNL  5TSTS 12.0 seconds WNL  10 Meter Gait Speed Self-selected: 11.8s =  0.85 m/s; Fastest: 8.0s =  1.25 m/s Below normative values for full community ambulation  ABC Scale  Pt unable to understand outcome measures fully  FOTO 64 67    POSTURAL CONTROL TESTS   Modified Clinical Test of Sensory Interaction for Balance    (CTSIB): Conditions 1-3 30s with increased sway in condition 3 on foam, Condition 4: 3-4s;   OCULOMOTOR / VESTIBULAR TESTING: Deferred           Objective measurements completed on examination: See above findings.              PT Education - 01/07/20 1211    Education Details  Plan of care    Person(s) Educated  Patient;Child(ren)    Methods  Explanation    Comprehension  Verbalized understanding       PT Short Term Goals - 01/07/20 1215      PT SHORT TERM GOAL #1   Title  Pt will be independent with HEP in order to improve strength and balance in order to decrease fall risk and improve function at home.    Time  6    Period  Weeks    Status  New    Target Date  02/17/20        PT Long Term Goals - 01/07/20 1215      PT LONG TERM GOAL #1   Title  Pt will improve BERG by at least 3 points in order to demonstrate clinically significant improvement in balance.    Baseline  01/06/20: 48/56    Time  12  Period  Weeks    Status  New    Target Date  03/30/20      PT LONG TERM GOAL #2   Title  Pt will increase FOTO to at least 67 in order to demonstrate clinically significant improvement in function at home to decrease fall risk.    Time  12    Period  Weeks    Status  New    Target Date  03/30/20      PT LONG TERM GOAL #3   Title  Pt will report moderate improvement in his balance since starting therapy to demonstrate imrpovement in balance  confidence and to decrease fall risk (pt unable to understand ABC scale)    Time  12    Period  Weeks    Status  New    Target Date  03/30/20      PT LONG TERM GOAL #4   Title  Pt will increase self-selected 10MWT by at least 0.13 m/s in order to demonstrate clinically significant improvement in community ambulation.    Baseline  01/06/20: Self-selected: 11.8s =  0.85 m/s    Time  12    Period  Weeks    Status  New    Target Date  03/31/20             Plan - 01/07/20 1212    Clinical Impression Statement  Pt is a pleasant 86 year-old male referred for difficulty with balance. PT examination reveals deficits in balance as identified by FOTO score of 64 and BERG of 48/56. His self-selected 49m gait speed is 0.85 m/s which is below cut-off for full community ambulation. Overall his LE strength is decent with TUG of 11.0s and 5TSTS of 12.0s. No focal sensory or strength deficits identified. He will benefit from skilled PT services to address deficits in balance and decrease risk for future falls.    Personal Factors and Comorbidities  Age;Comorbidity 3+;Time since onset of injury/illness/exacerbation    Comorbidities  HTN, MI, pacemaker    Examination-Activity Limitations  Caring for Others;Locomotion Level;Reach Overhead;Squat    Examination-Participation Restrictions  Church;Community Activity;Yard Work    Merchant [employer] officer  Evolving/Moderate complexity    Clinical Decision Making  Moderate    Rehab Potential  Good    PT Frequency  2x / week    PT Duration  12 weeks    PT Treatment/Interventions  ADLs/Self Care Home Management;Aquatic Therapy;Biofeedback;Canalith Repostioning;Cryotherapy;Electrical Stimulation;Iontophoresis 4mg /ml Dexamethasone;Moist Heat;Traction;Ultrasound;DME Instruction;Gait training;Stair training;Functional mobility training;Therapeutic activities;Therapeutic exercise;Balance training;Neuromuscular re-education;Cognitive remediation;Patient/family  education;Manual techniques;Passive range of motion;Dry needling;Vestibular    PT Next Visit Plan  Furnish HEP, initiate balance and strength training    PT Home Exercise Plan  None currently, to be provided at next visit    Consulted and Agree with Plan of Care  Patient;Family member/caregiver    Family Member Consulted  Daughter       Patient will benefit from skilled therapeutic intervention in order to improve the following deficits and impairments:  Abnormal gait, Decreased balance, Difficulty walking  Visit Diagnosis: Unsteadiness on feet  History of falling     Problem List There are no problems to display for this patient.  Phillips Grout PT, DPT, GCS  Soniya Ashraf 01/07/2020, 12:26 PM  [place] MAIN [facility] SERVICES [address] [place], [place], [address] Phone: [phone]   Fax:  [phone]  Name: SALBADOR SHEHAN MRN: [id number] Date of Birth: Dec 13, 1929

## 2020-01-11 ENCOUNTER — Ambulatory Visit: Payer: MEDICARE

## 2020-01-13 ENCOUNTER — Other Ambulatory Visit: Payer: Self-pay

## 2020-01-13 ENCOUNTER — Ambulatory Visit: Payer: MEDICARE

## 2020-01-13 DIAGNOSIS — R2681 Unsteadiness on feet: Secondary | ICD-10-CM | POA: Diagnosis not present

## 2020-01-13 DIAGNOSIS — Z9181 History of falling: Secondary | ICD-10-CM

## 2020-01-13 NOTE — Therapy (Signed)
[place] MAIN [facility] SERVICES [address] [place], [place], [address] Phone: [phone]   Fax:  [phone]  Physical Therapy Treatment  Patient Details  Name: Samuel Mack MRN: [id number] Date of Birth: 02/25/1930 Referring Provider (PT): Dr. Manuella Ghazi   Encounter Date: 01/13/2020  PT End of Session - 01/13/20 1627    Visit Number  2    Number of Visits  25    Date for PT Re-Evaluation  03/30/20    Authorization Type  eval: 01/06/20    PT Start Time  1625    PT Stop Time  1710    PT Time Calculation (min)  45 min    Equipment Utilized During Treatment  Gait belt    Activity Tolerance  Patient tolerated treatment well    Behavior During Therapy  [facility] for tasks assessed/performed       Past Medical History:  Diagnosis Date  . Asthma   . Cancer ([place])    skin  . Complication of anesthesia    previous pacemaker patient could feel everything  . Diabetes mellitus without complication (HCC)    diet controlled  . Dyspnea   . Dysrhythmia   . Elevated lipids   . Hypertension   . Hypothyroidism   . Myocardial infarction ([place])   . Presence of permanent cardiac pacemaker     Past Surgical History:  Procedure Laterality Date  . CATARACT EXTRACTION W/PHACO Left 12/11/2016   Procedure: CATARACT EXTRACTION PHACO AND INTRAOCULAR LENS PLACEMENT (IOC);  Surgeon: Leandrew Koyanagi, MD;  Location: [place];  Service: Ophthalmology;  Laterality: Left;  IVA TOPICAL LEFT  . EYE SURGERY Left    cataract  . NASAL SINUS SURGERY    . PACEMAKER GENERATOR CHANGE Left    x2  . PACEMAKER INSERTION    . PACEMAKER INSERTION Left 05/12/2019   Procedure: INSERTION PACEMAKER, LEFT;  Surgeon: Cletis Athens, MD;  Location: [facility];  Service: Cardiovascular;  Laterality: Left;  . PROSTATE SURGERY      There were no vitals filed for this visit.  Subjective Assessment - 01/13/20 1626    Subjective  Pt is doing well today. No falls since initial  evaluation. No recent changes in health or medications. No specific questions or concerns at time of arrival.    Pertinent History  Pt referred for difficulty with balance. Pt and daughter report that his difficulty with balance is progressive over the period of multiple years. Daughter states that patient's overall function has declined since his wife passed approximately 7 years ago. Per neurology note pt with possible peripheral neuropathy but currently holding off on NCS. Per daughter all labwork ordered at last visit was normal (CBC, CMP, TSH, Vit B12, Vit D). Pt has a pacemaker and reports that it is functioning well per cardiology. He states that he has had it replaced multiple times and was advised not to lift his arms over his head or lay on his chest due to concerns that he may dislodge the leads.    Limitations  Walking    Diagnostic tests  No head CT in chart, can't get brain MRI due to pacemaker    Patient Stated Goals  Improve balance and decrease risk for falls    Currently in Pain?  No/denies         TREATMENT   Ther-ex  NuStep L2/3 for warm-up during history x 5 minutes (4 minutes unbilled); Seated marches with green tband 2 x 10; Seated clams  with green tband 2 x 10; Seated adductor squeeze with ball between knees 2s hold 2 x 10; Sit to stand from regular height chair without UE support and Airex pad under feet 2 x 10;  Standing exercises with 3# ankle weights x 10 BLE: Marches Hip abduction HS curls   Neuromuscular Re-education  Airex WBOS eyes open/closed x 30s each; Airex WBOS horizontal and vertical head turns x 30s each; Airex alternating 6" step taps x 10 each; Staggered stance with front foot on 6" step and rear foot on Airex pad  Firm NBOS static balance x 30s; Firm semitandem balance alternating leading LE x 30s each;   Pt educated throughout session about proper posture and technique with exercises. Improved exercise technique, movement at target  joints, use of target muscles after min to mod verbal, visual, tactile cues.     Pt demonstrates excellent motivation during session today. Initiated strength and balance activities with pt. He demonstrates significant instability on Airex pad especially with eyes closed. Pt issued HEP to start at home with extensive education with daughter to ensure understanding. Pt encouraged to follow up as scheduled. Pt will benefit from PT services to address deficits in strength, balance, and mobility in order to return to full function at home.                    PT Short Term Goals - 01/07/20 1215      PT SHORT TERM GOAL #1   Title  Pt will be independent with HEP in order to improve strength and balance in order to decrease fall risk and improve function at home.    Time  6    Period  Weeks    Status  New    Target Date  02/17/20        PT Long Term Goals - 01/07/20 1215      PT LONG TERM GOAL #1   Title  Pt will improve BERG by at least 3 points in order to demonstrate clinically significant improvement in balance.    Baseline  01/06/20: 48/56    Time  12    Period  Weeks    Status  New    Target Date  03/30/20      PT LONG TERM GOAL #2   Title  Pt will increase FOTO to at least 67 in order to demonstrate clinically significant improvement in function at home to decrease fall risk.    Time  12    Period  Weeks    Status  New    Target Date  03/30/20      PT LONG TERM GOAL #3   Title  Pt will report moderate improvement in his balance since starting therapy to demonstrate imrpovement in balance confidence and to decrease fall risk (pt unable to understand ABC scale)    Time  12    Period  Weeks    Status  New    Target Date  03/30/20      PT LONG TERM GOAL #4   Title  Pt will increase self-selected 10MWT by at least 0.13 m/s in order to demonstrate clinically significant improvement in community ambulation.    Baseline  01/06/20: Self-selected: 11.8s =  0.85 m/s     Time  12    Period  Weeks    Status  New    Target Date  03/31/20            Plan - 01/13/20  1628    Clinical Impression Statement  Pt demonstrates excellent motivation during session today. Initiated strength and balance activities with pt. He demonstrates significant instability on Airex pad especially with eyes closed. Pt issued HEP to start at home with extensive education with daughter to ensure understanding. Pt encouraged to follow up as scheduled. Pt will benefit from PT services to address deficits in strength, balance, and mobility in order to return to full function at home.    Personal Factors and Comorbidities  Age;Comorbidity 3+;Time since onset of injury/illness/exacerbation    Comorbidities  HTN, MI, pacemaker    Examination-Activity Limitations  Caring for Others;Locomotion Level;Reach Overhead;Squat    Examination-Participation Restrictions  Church;Community Activity;Yard Work    [employer]chant [employer] officer  Evolving/Moderate complexity    Rehab Potential  Good    PT Frequency  2x / week    PT Duration  12 weeks    PT Treatment/Interventions  ADLs/Self Care Home Management;Aquatic Therapy;Biofeedback;Canalith Repostioning;Cryotherapy;Electrical Stimulation;Iontophoresis 4mg /ml Dexamethasone;Moist Heat;Traction;Ultrasound;DME Instruction;Gait training;Stair training;Functional mobility training;Therapeutic activities;Therapeutic exercise;Balance training;Neuromuscular re-education;Cognitive remediation;Patient/family education;Manual techniques;Passive range of motion;Dry needling;Vestibular    PT Next Visit Plan  Continue balance and strength training    PT Home Exercise Plan  Access Code: [email or web address]    Consulted and Agree with Plan of Care  Patient;Family member/caregiver    Family Member Consulted  Daughter       Patient will benefit from skilled therapeutic intervention in order to improve the following deficits and impairments:  Abnormal gait, Decreased  balance, Difficulty walking  Visit Diagnosis: Unsteadiness on feet  History of falling     Problem List There are no problems to display for this patient.  Phillips Grout PT, DPT, GCS  Stanlee Roehrig 01/13/2020, 5:23 PM  [place] MAIN [facility] SERVICES [address] [place], [place], [address] Phone: [phone]   Fax:  [phone]  Name: Samuel Mack MRN: [id number] Date of Birth: 06/07/1930

## 2020-01-13 NOTE — Patient Instructions (Signed)
Access Code: [email or web address] URL: [email or web address] Date: 01/13/2020 Prepared by: Roxana Hires  Exercises Seated March with Resistance - 1 x daily - 7 x weekly - 10 reps - 3 sets Seated Hip Abduction with Resistance - 1 x daily - 7 x weekly - 10 reps - 3 sets Sit to Stand without Arm Support - 1 x daily - 7 x weekly - 10 reps - 3 sets Standing Romberg to 1/2 Tandem Stance - 1 x daily - 7 x weekly - 30s x 2 with each foot forward hold Standing March with Unilateral Counter Support - 1 x daily - 7 x weekly - 2 sets - 10 reps

## 2020-01-17 ENCOUNTER — Other Ambulatory Visit: Payer: Self-pay

## 2020-01-17 ENCOUNTER — Ambulatory Visit: Payer: MEDICARE | Attending: Neurology

## 2020-01-17 DIAGNOSIS — R2681 Unsteadiness on feet: Secondary | ICD-10-CM | POA: Diagnosis present

## 2020-01-17 DIAGNOSIS — Z9181 History of falling: Secondary | ICD-10-CM | POA: Diagnosis present

## 2020-01-17 NOTE — Therapy (Signed)
[place] MAIN [facility] SERVICES [address] [place], [place], [address] Phone: [phone]   Fax:  [phone]  Physical Therapy Treatment  Patient Details  Name: Samuel Mack MRN: [id number] Date of Birth: Nov 09, 1929 Referring Provider (PT): Dr. Manuella Ghazi   Encounter Date: 01/17/2020  PT End of Session - 01/17/20 1518    Visit Number  3    Number of Visits  25    Date for PT Re-Evaluation  03/30/20    Authorization Type  eval: 01/06/20    PT Start Time  1515    PT Stop Time  1600    PT Time Calculation (min)  45 min    Equipment Utilized During Treatment  Gait belt    Activity Tolerance  Patient tolerated treatment well    Behavior During Therapy  [facility] for tasks assessed/performed       Past Medical History:  Diagnosis Date  . Asthma   . Cancer ([place])    skin  . Complication of anesthesia    previous pacemaker patient could feel everything  . Diabetes mellitus without complication (HCC)    diet controlled  . Dyspnea   . Dysrhythmia   . Elevated lipids   . Hypertension   . Hypothyroidism   . Myocardial infarction ([place])   . Presence of permanent cardiac pacemaker     Past Surgical History:  Procedure Laterality Date  . CATARACT EXTRACTION W/PHACO Left 12/11/2016   Procedure: CATARACT EXTRACTION PHACO AND INTRAOCULAR LENS PLACEMENT (IOC);  Surgeon: Leandrew Koyanagi, MD;  Location: [place];  Service: Ophthalmology;  Laterality: Left;  IVA TOPICAL LEFT  . EYE SURGERY Left    cataract  . NASAL SINUS SURGERY    . PACEMAKER GENERATOR CHANGE Left    x2  . PACEMAKER INSERTION    . PACEMAKER INSERTION Left 05/12/2019   Procedure: INSERTION PACEMAKER, LEFT;  Surgeon: Cletis Athens, MD;  Location: [facility];  Service: Cardiovascular;  Laterality: Left;  . PROSTATE SURGERY      There were no vitals filed for this visit.  Subjective Assessment - 01/17/20 1517    Subjective  Pt is doing well today. No falls since initial  evaluation. No recent changes in health or medications. He was sore for a couple days after the last therapy visit. No specific questions or concerns at time of arrival.    Pertinent History  Pt referred for difficulty with balance. Pt and daughter report that his difficulty with balance is progressive over the period of multiple years. Daughter states that patient's overall function has declined since his wife passed approximately 7 years ago. Per neurology note pt with possible peripheral neuropathy but currently holding off on NCS. Per daughter all labwork ordered at last visit was normal (CBC, CMP, TSH, Vit B12, Vit D). Pt has a pacemaker and reports that it is functioning well per cardiology. He states that he has had it replaced multiple times and was advised not to lift his arms over his head or lay on his chest due to concerns that he may dislodge the leads.    Limitations  Walking    Diagnostic tests  No head CT in chart, can't get brain MRI due to pacemaker    Patient Stated Goals  Improve balance and decrease risk for falls    Currently in Pain?  No/denies        TREATMENT   Ther-ex  NuStep L2-4 for warm-up during history x 5 minutes (4 minutes  unbilled); Quantum BLE leg press 55# 2 x 15;  Standing exercises with 3# ankle weights (AW) x 15 BLE: Marches Hip abduction HS curls Hip extension  Seated LAQ with 3# AW x 15 BLE; Standing heel raises with BUE support x 15 BLE; Sit to stand from regular height chair without UE support, Airex pad under feet, and holding 2kg med ball 2 x 10;   Neuromuscular Re-education  Airex NBOS velcro dart toss; Airex NBOS dynamic reaching for cones in a variety of directions, distances, and crossing midline x multiple bouts with each arm; Airex NBOS eyes open/closed x 30s each; Airex alternating 6" step taps x 10 each; Staggered stance with front foot on 6" step and rear foot on Airex pad  Tandem balance alternating forward LE x 30s with each  foot forward;   Pt educated throughout session about proper posture and technique with exercises. Improved exercise technique, movement at target joints, use of target muscles after min to mod verbal, visual, tactile cues.     Pt demonstrates excellent motivation during session today. Continued with strength and balance activities with pt today. Continued to require intermittent seated rest breaks between exercises. He demonstrates significant instability on Airex pad especially with eyes closed again today. No HEP modifications today. Introduced leg press into session today. Therapist did not increase resistance of ankle weights but did increase number of repetitions during each set to fifteen. Pt encouraged to follow up as scheduled. Pt will benefit from PT services to address deficits in strength, balance, and mobility in order to return to full function at home.                           PT Short Term Goals - 01/07/20 1215      PT SHORT TERM GOAL #1   Title  Pt will be independent with HEP in order to improve strength and balance in order to decrease fall risk and improve function at home.    Time  6    Period  Weeks    Status  New    Target Date  02/17/20        PT Long Term Goals - 01/07/20 1215      PT LONG TERM GOAL #1   Title  Pt will improve BERG by at least 3 points in order to demonstrate clinically significant improvement in balance.    Baseline  01/06/20: 48/56    Time  12    Period  Weeks    Status  New    Target Date  03/30/20      PT LONG TERM GOAL #2   Title  Pt will increase FOTO to at least 67 in order to demonstrate clinically significant improvement in function at home to decrease fall risk.    Time  12    Period  Weeks    Status  New    Target Date  03/30/20      PT LONG TERM GOAL #3   Title  Pt will report moderate improvement in his balance since starting therapy to demonstrate imrpovement in balance confidence and to decrease  fall risk (pt unable to understand ABC scale)    Time  12    Period  Weeks    Status  New    Target Date  03/30/20      PT LONG TERM GOAL #4   Title  Pt will increase self-selected 10MWT by at least 0.13  m/s in order to demonstrate clinically significant improvement in community ambulation.    Baseline  01/06/20: Self-selected: 11.8s =  0.85 m/s    Time  12    Period  Weeks    Status  New    Target Date  03/31/20            Plan - 01/17/20 1518    Clinical Impression Statement  Pt demonstrates excellent motivation during session today. Continued with strength and balance activities with pt today. Continued to require intermittent seated rest breaks between exercises. He demonstrates significant instability on Airex pad especially with eyes closed again today. No HEP modifications today. Introduced leg press into session today. Therapist did not increase resistance of ankle weights but did increase number of repetitions during each set to fifteen. Pt encouraged to follow up as scheduled. Pt will benefit from PT services to address deficits in strength, balance, and mobility in order to return to full function at home.    Personal Factors and Comorbidities  Age;Comorbidity 3+;Time since onset of injury/illness/exacerbation    Comorbidities  HTN, MI, pacemaker    Examination-Activity Limitations  Caring for Others;Locomotion Level;Reach Overhead;Squat    Examination-Participation Restrictions  Church;Community Activity;Yard Work    Merchant navy officer  Evolving/Moderate complexity    Rehab Potential  Good    PT Frequency  2x / week    PT Duration  12 weeks    PT Treatment/Interventions  ADLs/Self Care Home Management;Aquatic Therapy;Biofeedback;Canalith Repostioning;Cryotherapy;Electrical Stimulation;Iontophoresis 4mg /ml Dexamethasone;Moist Heat;Traction;Ultrasound;DME Instruction;Gait training;Stair training;Functional mobility training;Therapeutic activities;Therapeutic  exercise;Balance training;Neuromuscular re-education;Cognitive remediation;Patient/family education;Manual techniques;Passive range of motion;Dry needling;Vestibular    PT Next Visit Plan  Continue balance and strength training    PT Home Exercise Plan  Access Code: [email or web address]    Consulted and Agree with Plan of Care  Patient;Family member/caregiver    Family Member Consulted  Daughter       Patient will benefit from skilled therapeutic intervention in order to improve the following deficits and impairments:  Abnormal gait, Decreased balance, Difficulty walking  Visit Diagnosis: Unsteadiness on feet  History of falling     Problem List There are no problems to display for this patient.  Phillips Grout PT, DPT, GCS  Dangela How 01/17/2020, 5:11 PM  [place] MAIN [facility] SERVICES [address] [place], [place], [address] Phone: [phone]   Fax:  [phone]  Name: ABE REINE MRN: [id number] Date of Birth: 12/22/1929

## 2020-01-19 ENCOUNTER — Other Ambulatory Visit: Payer: Self-pay

## 2020-01-19 ENCOUNTER — Encounter: Payer: Self-pay | Admitting: Physical Therapy

## 2020-01-19 ENCOUNTER — Ambulatory Visit: Payer: MEDICARE | Admitting: Physical Therapy

## 2020-01-19 DIAGNOSIS — Z9181 History of falling: Secondary | ICD-10-CM

## 2020-01-19 DIAGNOSIS — R2681 Unsteadiness on feet: Secondary | ICD-10-CM | POA: Diagnosis not present

## 2020-01-19 NOTE — Therapy (Signed)
[place] MAIN [facility] SERVICES [address] [place], [place], [address] Phone: [phone]   Fax:  [phone]  Physical Therapy Treatment  Patient Details  Name: Samuel Mack MRN: [id number] Date of Birth: 1930/06/15 Referring Provider (PT): Dr. Manuella Ghazi   Encounter Date: 01/19/2020  PT End of Session - 01/19/20 1516    Visit Number  4    Number of Visits  25    Date for PT Re-Evaluation  03/30/20    Authorization Type  eval: 01/06/20    PT Start Time  1515    PT Stop Time  1555    PT Time Calculation (min)  40 min    Equipment Utilized During Treatment  Gait belt    Activity Tolerance  Patient tolerated treatment well    Behavior During Therapy  [facility] for tasks assessed/performed       Past Medical History:  Diagnosis Date  . Asthma   . Cancer ([place])    skin  . Complication of anesthesia    previous pacemaker patient could feel everything  . Diabetes mellitus without complication (HCC)    diet controlled  . Dyspnea   . Dysrhythmia   . Elevated lipids   . Hypertension   . Hypothyroidism   . Myocardial infarction ([place])   . Presence of permanent cardiac pacemaker     Past Surgical History:  Procedure Laterality Date  . CATARACT EXTRACTION W/PHACO Left 12/11/2016   Procedure: CATARACT EXTRACTION PHACO AND INTRAOCULAR LENS PLACEMENT (IOC);  Surgeon: Leandrew Koyanagi, MD;  Location: [place];  Service: Ophthalmology;  Laterality: Left;  IVA TOPICAL LEFT  . EYE SURGERY Left    cataract  . NASAL SINUS SURGERY    . PACEMAKER GENERATOR CHANGE Left    x2  . PACEMAKER INSERTION    . PACEMAKER INSERTION Left 05/12/2019   Procedure: INSERTION PACEMAKER, LEFT;  Surgeon: Cletis Athens, MD;  Location: [facility];  Service: Cardiovascular;  Laterality: Left;  . PROSTATE SURGERY      There were no vitals filed for this visit.  Subjective Assessment - 01/19/20 1515    Subjective  Pt is doing well today. No falls since initial  evaluation. No recent changes in health or medications. He was sore for a couple days after the last therapy visit. No specific questions or concerns at time of arrival.    Pertinent History  Pt referred for difficulty with balance. Pt and daughter report that his difficulty with balance is progressive over the period of multiple years. Daughter states that patient's overall function has declined since his wife passed approximately 7 years ago. Per neurology note pt with possible peripheral neuropathy but currently holding off on NCS. Per daughter all labwork ordered at last visit was normal (CBC, CMP, TSH, Vit B12, Vit D). Pt has a pacemaker and reports that it is functioning well per cardiology. He states that he has had it replaced multiple times and was advised not to lift his arms over his head or lay on his chest due to concerns that he may dislodge the leads.    Limitations  Walking    Diagnostic tests  No head CT in chart, can't get brain MRI due to pacemaker    Patient Stated Goals  Improve balance and decrease risk for falls    Currently in Pain?  No/denies    Multiple Pain Sites  No       Treatment: Neuromuscular Training: Stool: staggered stance, head turns side/side,  up/down x 5 reps each, each foot in front; VCs for proper technique and positioning for each exercise staggered stance, trunk rotation with 2 lb rod, VC to keep UE straight and turn head with trunk  Step ups to 6 inch stool x 10 BLE x 2 sets Eccentric step downs x 10 x 2 sets from 6 inch stool   Blue Foam: Steps ups from foam to 6 inch stool   Side stepping on blue air beam x 10 laps without UE support BOSU: Lunge to BOSU ball x 10 , cues for going slow and to control the speed    Pt educated throughout session about proper posture and technique with exercises. Improved exercise technique, movement at target joints, use of target muscles after min to mod verbal, visual, tactile  cues.                       PT Education - 01/19/20 1515    Education Details  HEP    Person(s) Educated  Patient    Methods  Explanation    Comprehension  Verbalized understanding;Returned demonstration;Verbal cues required       PT Short Term Goals - 01/07/20 1215      PT SHORT TERM GOAL #1   Title  Pt will be independent with HEP in order to improve strength and balance in order to decrease fall risk and improve function at home.    Time  6    Period  Weeks    Status  New    Target Date  02/17/20        PT Long Term Goals - 01/07/20 1215      PT LONG TERM GOAL #1   Title  Pt will improve BERG by at least 3 points in order to demonstrate clinically significant improvement in balance.    Baseline  01/06/20: 48/56    Time  12    Period  Weeks    Status  New    Target Date  03/30/20      PT LONG TERM GOAL #2   Title  Pt will increase FOTO to at least 67 in order to demonstrate clinically significant improvement in function at home to decrease fall risk.    Time  12    Period  Weeks    Status  New    Target Date  03/30/20      PT LONG TERM GOAL #3   Title  Pt will report moderate improvement in his balance since starting therapy to demonstrate imrpovement in balance confidence and to decrease fall risk (pt unable to understand ABC scale)    Time  12    Period  Weeks    Status  New    Target Date  03/30/20      PT LONG TERM GOAL #4   Title  Pt will increase self-selected 10MWT by at least 0.13 m/s in order to demonstrate clinically significant improvement in community ambulation.    Baseline  01/06/20: Self-selected: 11.8s =  0.85 m/s    Time  12    Period  Weeks    Status  New    Target Date  03/31/20            Plan - 01/19/20 1516    Clinical Impression Statement  Patient instructed in LE strengthening and balance exercise.  Patient requires min Vcs for correct exercise technique including to improve LE control with standing exercise.  Patient demonstrates difficulty  with  control with uneven surfaces  with rail assist. Patient would benefit from additional skilled PT intervention to improve balance/gait safety and reduce fall risk.   Personal Factors and Comorbidities  Age;Comorbidity 3+;Time since onset of injury/illness/exacerbation    Comorbidities  HTN, MI, pacemaker    Examination-Activity Limitations  Caring for Others;Locomotion Level;Reach Overhead;Squat    Examination-Participation Restrictions  Church;Community Activity;Yard Work    Merchant navy officer  Evolving/Moderate complexity    Rehab Potential  Good    PT Frequency  2x / week    PT Duration  12 weeks    PT Treatment/Interventions  ADLs/Self Care Home Management;Aquatic Therapy;Biofeedback;Canalith Repostioning;Cryotherapy;Electrical Stimulation;Iontophoresis 4mg /ml Dexamethasone;Moist Heat;Traction;Ultrasound;DME Instruction;Gait training;Stair training;Functional mobility training;Therapeutic activities;Therapeutic exercise;Balance training;Neuromuscular re-education;Cognitive remediation;Patient/family education;Manual techniques;Passive range of motion;Dry needling;Vestibular    PT Next Visit Plan  Continue balance and strength training    PT Home Exercise Plan  Access Code: [email or web address]    Consulted and Agree with Plan of Care  Patient;Family member/caregiver    Family Member Consulted  Daughter       Patient will benefit from skilled therapeutic intervention in order to improve the following deficits and impairments:  Abnormal gait, Decreased balance, Difficulty walking  Visit Diagnosis: Unsteadiness on feet  History of falling     Problem List There are no problems to display for this patient.   [address] [place] DPT 01/19/2020, 3:17 PM  [place] MAIN [facility] SERVICES [address] [place], [place], [address] Phone: [phone]   Fax:  [phone]  Name: Samuel Mack MRN:  [id number] Date of Birth: [birth year]

## 2020-01-20 ENCOUNTER — Other Ambulatory Visit: Payer: Self-pay

## 2020-01-20 ENCOUNTER — Ambulatory Visit (INDEPENDENT_AMBULATORY_CARE_PROVIDER_SITE_OTHER): Payer: MEDICARE | Admitting: Internal Medicine

## 2020-01-20 VITALS — BP 146/80 | HR 82

## 2020-01-20 DIAGNOSIS — I495 Sick sinus syndrome: Secondary | ICD-10-CM | POA: Insufficient documentation

## 2020-01-20 DIAGNOSIS — Z950 Presence of cardiac pacemaker: Secondary | ICD-10-CM | POA: Insufficient documentation

## 2020-01-20 NOTE — Progress Notes (Signed)
Established Patient Office Visit  Subjective:  Patient ID: Samuel Mack, male    DOB: 05-25-1930  Age: 86 y.o. MRN: [id number]  CC:  Chief Complaint  Patient presents with  . Pacemaker Check    HPI  Samuel Mack presents fo came for pacemaker check.  Is denying any chest pain nausea vomiting shortness of breath his pacemaker was placed about 7 years ago because of the sick sinus syndrome he is doing well except for problems with losing weight he is also known to have a heart murmur  Past Medical History:  Diagnosis Date  . Asthma   . Cancer ([place])    skin  . Complication of anesthesia    previous pacemaker patient could feel everything  . Diabetes mellitus without complication (HCC)    diet controlled  . Dyspnea   . Dysrhythmia   . Elevated lipids   . Hypertension   . Hypothyroidism   . Myocardial infarction ([place])   . Presence of permanent cardiac pacemaker     Past Surgical History:  Procedure Laterality Date  . CATARACT EXTRACTION W/PHACO Left 12/11/2016   Procedure: CATARACT EXTRACTION PHACO AND INTRAOCULAR LENS PLACEMENT (IOC);  Surgeon: Leandrew Koyanagi, MD;  Location: [place];  Service: Ophthalmology;  Laterality: Left;  IVA TOPICAL LEFT  . EYE SURGERY Left    cataract  . NASAL SINUS SURGERY    . PACEMAKER GENERATOR CHANGE Left    x2  . PACEMAKER INSERTION    . PACEMAKER INSERTION Left 05/12/2019   Procedure: INSERTION PACEMAKER, LEFT;  Surgeon: Cletis Athens, MD;  Location: [facility];  Service: Cardiovascular;  Laterality: Left;  . PROSTATE SURGERY      No family history on file.  Social History   Socioeconomic History  . Marital status: Widowed    Spouse name: Not on file  . Number of children: Not on file  . Years of education: Not on file  . Highest education level: Not on file  Occupational History  . Not on file  Tobacco Use  . Smoking status: Former Smoker    Quit date: 01/26/1979    Years since quitting: 41.0  . Smokeless  tobacco: Never Used  Substance and Sexual Activity  . Alcohol use: No  . Drug use: No  . Sexual activity: Not on file  Other Topics Concern  . Not on file  Social History Narrative  . Not on file   Social Determinants of Health   Financial Resource Strain:   . Difficulty of Paying Living Expenses:   Food Insecurity:   . Worried About Charity fundraiser in the Last Year:   . Arboriculturist in the Last Year:   Transportation Needs:   . Film/video editor (Medical):   Marland Kitchen Lack of Transportation (Non-Medical):   Physical Activity:   . Days of Exercise per Week:   . Minutes of Exercise per Session:   Stress:   . Feeling of Stress :   Social Connections:   . Frequency of Communication with Friends and Family:   . Frequency of Social Gatherings with Friends and Family:   . Attends Religious Services:   . Active Member of Clubs or Organizations:   . Attends Archivist Meetings:   Marland Kitchen Marital Status:   Intimate Partner Violence:   . Fear of Current or Ex-Partner:   . Emotionally Abused:   Marland Kitchen Physically Abused:   . Sexually Abused:      Current  Outpatient Medications:  .  acetaminophen (TYLENOL 8 HOUR) 650 MG CR tablet, Take 1 tablet (650 mg total) by mouth every 8 (eight) hours as needed for pain., Disp: 2 tablet, Rfl: 1 .  aspirin 81 MG tablet, Take 81 mg by mouth daily., Disp: , Rfl:  .  atorvastatin (LIPITOR) 10 MG tablet, Take 10 mg by mouth at bedtime., Disp: , Rfl:  .  co-enzyme Q-10 30 MG capsule, Take by mouth daily. , Disp: , Rfl:  .  levothyroxine (SYNTHROID) 175 MCG tablet, Take 175 mcg by mouth daily before breakfast., Disp: , Rfl:  .  losartan-hydrochlorothiazide (HYZAAR) 50-12.5 MG per tablet, Take 1 tablet by mouth daily., Disp: , Rfl:  .  Melatonin 5 MG TABS, Take 5 mg by mouth at bedtime., Disp: , Rfl:  .  Multiple Vitamins-Minerals (PRESERVISION AREDS PO), Take by mouth 2 (two) times daily., Disp: , Rfl:  .  nebivolol (BYSTOLIC) 10 MG tablet, Take  10 mg by mouth daily., Disp: , Rfl:  .  Omega-3 Fatty Acids (FISH OIL) 1200 MG CAPS, Take 1,200 mg by mouth 3 (three) times daily., Disp: , Rfl:  .  spironolactone (ALDACTONE) 25 MG tablet, Take 25 mg by mouth daily., Disp: , Rfl:  .  TIADYLT ER 180 MG 24 hr capsule, Take 180 mg by mouth daily., Disp: , Rfl:  .  vitamin C (ASCORBIC ACID) 500 MG tablet, Take 500 mg by mouth daily., Disp: , Rfl:    Allergies  Allergen Reactions  . Bee Venom Swelling  . Penicillins Swelling    Patient reports he swells and it was "something in the pcn"   . Amoxicillin Swelling    ROS Review of Systems  Constitutional: Negative for chills.  HENT: Negative for congestion.   Respiratory: Negative for chest tightness.   Cardiovascular: Negative for leg swelling.  Gastrointestinal: Negative for abdominal distention.  Endocrine: Negative for heat intolerance.  Genitourinary: Negative for flank pain.  Neurological: Negative for dizziness.  Psychiatric/Behavioral: Negative for decreased concentration.      Objective:    Physical Exam  Constitutional: He is oriented to person, place, and time. He appears well-developed.  HENT:  Head: Normocephalic.  Eyes: Pupils are equal, round, and reactive to light.  Cardiovascular: Normal rate.  Pulmonary/Chest: Effort normal.  Abdominal: Soft.  Musculoskeletal:     Cervical back: Normal range of motion.  Neurological: He is alert and oriented to person, place, and time. He has normal reflexes. No cranial nerve deficit.  Skin: Skin is warm.    BP (!) 146/80   Pulse 82  Wt Readings from Last 3 Encounters:  05/12/19 220 lb 7.4 oz (100 kg)  05/07/19 220 lb (99.8 kg)  09/03/17 230 lb (104.3 kg)     Health Maintenance Due  Topic Date Due  . COVID-19 Vaccine (1) Never done  . TETANUS/TDAP  Never done  . PNA vac Low Risk Adult (1 of 2 - PCV13) Never done    There are no preventive care reminders to display for this patient.  No results found for:  TSH Lab Results  Component Value Date   WBC 10.7 (H) 05/10/2019   HGB 12.8 (L) 05/10/2019   HCT 39.5 05/10/2019   MCV 92.3 05/10/2019   PLT 186 05/10/2019   Lab Results  Component Value Date   NA 135 05/10/2019   K 4.0 05/10/2019   CO2 20 (L) 05/10/2019   GLUCOSE 132 (H) 05/10/2019   BUN 34 (H) 05/10/2019  CREATININE 1.78 (H) 05/10/2019   BILITOT 0.7 05/10/2019   ALKPHOS 69 05/10/2019   AST 23 05/10/2019   ALT 22 05/10/2019   PROT 6.9 05/10/2019   ALBUMIN 3.8 05/10/2019   CALCIUM 9.2 05/10/2019   ANIONGAP 9 05/10/2019   No results found for: CHOL No results found for: HDL No results found for: LDLCALC No results found for: TRIG No results found for: CHOLHDL No results found for: HGBA1C    Assessment & Plan:   Problem List Items Addressed This Visit      Cardiovascular and Mediastinum   Sick sinus syndrome [facility])     Other   Cardiac pacemaker in situ - Primary   Relevant Orders   PACEMAKER IN CLINIC CHECK (Completed)     Note: Medical Device Follow-up  Patient pacemaker was interrogated by pacemakers analyzer, battery status is okay.  No programming changes were indicated after the review of the data.  Histogram shows no change since the last interrogation Atrial and ventricular sensing thresholds were found to be acceptable Impedance was checked and it was found to be normal.  Thresholds were found to be okay on evaluation of rhythm problem.  No high rate or low rate arrhythmia were noted.  Estimated battery longevity is6 months. I have personally reviewed the device data and amended the report as necessary.   No orders of the defined types were placed in this encounter.   Follow-up: Return in about 3 months (around 04/21/2020).    Cletis Athens, MD

## 2020-01-24 ENCOUNTER — Other Ambulatory Visit: Payer: Self-pay

## 2020-01-24 ENCOUNTER — Ambulatory Visit: Payer: MEDICARE

## 2020-01-24 DIAGNOSIS — R2681 Unsteadiness on feet: Secondary | ICD-10-CM

## 2020-01-24 DIAGNOSIS — Z9181 History of falling: Secondary | ICD-10-CM

## 2020-01-24 NOTE — Therapy (Signed)
[place] MAIN [facility] SERVICES [address] [place], [place], [address] Phone: [phone]   Fax:  [phone]  Physical Therapy Treatment  Patient Details  Name: Samuel Mack MRN: [id number] Date of Birth: Jan 24, 1930 Referring Provider (PT): Dr. Manuella Ghazi   Encounter Date: 01/24/2020  PT End of Session - 01/24/20 1548    Visit Number  5    Number of Visits  25    Date for PT Re-Evaluation  03/30/20    Authorization Type  eval: 01/06/20    PT Start Time  1430    PT Stop Time  1515    PT Time Calculation (min)  45 min    Equipment Utilized During Treatment  Gait belt    Activity Tolerance  Patient tolerated treatment well    Behavior During Therapy  [facility] for tasks assessed/performed       Past Medical History:  Diagnosis Date  . Asthma   . Cancer ([place])    skin  . Complication of anesthesia    previous pacemaker patient could feel everything  . Diabetes mellitus without complication (HCC)    diet controlled  . Dyspnea   . Dysrhythmia   . Elevated lipids   . Hypertension   . Hypothyroidism   . Myocardial infarction ([place])   . Presence of permanent cardiac pacemaker     Past Surgical History:  Procedure Laterality Date  . CATARACT EXTRACTION W/PHACO Left 12/11/2016   Procedure: CATARACT EXTRACTION PHACO AND INTRAOCULAR LENS PLACEMENT (IOC);  Surgeon: Leandrew Koyanagi, MD;  Location: [place];  Service: Ophthalmology;  Laterality: Left;  IVA TOPICAL LEFT  . EYE SURGERY Left    cataract  . NASAL SINUS SURGERY    . PACEMAKER GENERATOR CHANGE Left    x2  . PACEMAKER INSERTION    . PACEMAKER INSERTION Left 05/12/2019   Procedure: INSERTION PACEMAKER, LEFT;  Surgeon: Cletis Athens, MD;  Location: [facility];  Service: Cardiovascular;  Laterality: Left;  . PROSTATE SURGERY      There were no vitals filed for this visit.  Subjective Assessment - 01/24/20 1419    Subjective  Pt is doing well today. No falls since last visit.  No recent changes in health or medications. No soreness reported upon arrival today. No specific questions or concerns at time of arrival.    Pertinent History  Pt referred for difficulty with balance. Pt and daughter report that his difficulty with balance is progressive over the period of multiple years. Daughter states that patient's overall function has declined since his wife passed approximately 7 years ago. Per neurology note pt with possible peripheral neuropathy but currently holding off on NCS. Per daughter all labwork ordered at last visit was normal (CBC, CMP, TSH, Vit B12, Vit D). Pt has a pacemaker and reports that it is functioning well per cardiology. He states that he has had it replaced multiple times and was advised not to lift his arms over his head or lay on his chest due to concerns that he may dislodge the leads.    Limitations  Walking    Diagnostic tests  No head CT in chart, can't get brain MRI due to pacemaker    Patient Stated Goals  Improve balance and decrease risk for falls    Currently in Pain?  No/denies         TREATMENT   Ther-ex NuStep L2-5 for warm-up during history x 5 minutes (4 minutes unbilled); Quantum BLE leg press 70#  2 x 20;  Standing exercises with 4# ankle weights (AW) x 20 BLE: Marches Hip abduction HS curls Hip extension  Seated LAQ with 4# AW x 20 BLE; Standing heel raises with BUE support x 20 BLE;  Standing mini squats with BUE support on rails   Neuromuscular Re-education Airex NBOS eyes open/closed x 30s each; Airex alternating 6" step taps x 10 each; Airex eyes open horizontal and vertical head turns x 30s each; 1/2 foam roll partial step-overs alternating leading LE x 10 on each side; 1/2 foam roll full step-over forward/backward x 10; 1/2 foam roll (round side up) balance without UE support x 30s; Tandem balance alternating forward LE x 30s with each foot forward;   Pt educated throughout session about proper  posture and technique with exercises. Improved exercise technique, movement at target joints, use of target muscles after min to mod verbal, visual, tactile cues.   Pt demonstrates excellent motivation during session today. Continued with strength and balance activities with pt today. Continued to require intermittent seated rest breaks between exercises but appears less fatigued compared to previous sessions. He continues to demonstrates significant instability on Airex pad especially with eyes closed again today. No HEP modifications today. Continued with leg press during session today but reps and weight both increased. Pt encouraged to follow up as scheduled.Pt will benefit from PT services to address deficits in strength, balance, and mobility in order to return to full function at home.                        PT Short Term Goals - 01/07/20 1215      PT SHORT TERM GOAL #1   Title  Pt will be independent with HEP in order to improve strength and balance in order to decrease fall risk and improve function at home.    Time  6    Period  Weeks    Status  New    Target Date  02/17/20        PT Long Term Goals - 01/07/20 1215      PT LONG TERM GOAL #1   Title  Pt will improve BERG by at least 3 points in order to demonstrate clinically significant improvement in balance.    Baseline  01/06/20: 48/56    Time  12    Period  Weeks    Status  New    Target Date  03/30/20      PT LONG TERM GOAL #2   Title  Pt will increase FOTO to at least 67 in order to demonstrate clinically significant improvement in function at home to decrease fall risk.    Time  12    Period  Weeks    Status  New    Target Date  03/30/20      PT LONG TERM GOAL #3   Title  Pt will report moderate improvement in his balance since starting therapy to demonstrate imrpovement in balance confidence and to decrease fall risk (pt unable to understand ABC scale)    Time  12    Period  Weeks     Status  New    Target Date  03/30/20      PT LONG TERM GOAL #4   Title  Pt will increase self-selected 10MWT by at least 0.13 m/s in order to demonstrate clinically significant improvement in community ambulation.    Baseline  01/06/20: Self-selected: 11.8s =  0.85 m/s  Time  12    Period  Weeks    Status  New    Target Date  03/31/20            Plan - 01/24/20 1549    Clinical Impression Statement  Pt demonstrates excellent motivation during session today. Continued with strength and balance activities with pt today. Continued to require intermittent seated rest breaks between exercises but appears less fatigued compared to previous sessions. He continues to demonstrates significant instability on Airex pad especially with eyes closed again today. No HEP modifications today. Continued with leg press during session today but reps and weight both increased. Pt encouraged to follow up as scheduled. Pt will benefit from PT services to address deficits in strength, balance, and mobility in order to return to full function at home.    Personal Factors and Comorbidities  Age;Comorbidity 3+;Time since onset of injury/illness/exacerbation    Comorbidities  HTN, MI, pacemaker    Examination-Activity Limitations  Caring for Others;Locomotion Level;Reach Overhead;Squat    Examination-Participation Restrictions  Church;Community Activity;Yard Work    [employer] officer  Evolving/Moderate complexity    Rehab Potential  Good    PT Frequency  2x / week    PT Duration  12 weeks    PT Treatment/Interventions  ADLs/Self Care Home Management;Aquatic Therapy;Biofeedback;Canalith Repostioning;Cryotherapy;Electrical Stimulation;Iontophoresis 4mg /ml Dexamethasone;Moist Heat;Traction;Ultrasound;DME Instruction;Gait training;Stair training;Functional mobility training;Therapeutic activities;Therapeutic exercise;Balance training;Neuromuscular re-education;Cognitive remediation;Patient/family  education;Manual techniques;Passive range of motion;Dry needling;Vestibular    PT Next Visit Plan  Continue balance and strength training    PT Home Exercise Plan  Access Code: [email or web address]    Consulted and Agree with Plan of Care  Patient;Family member/caregiver    Family Member Consulted  Daughter       Patient will benefit from skilled therapeutic intervention in order to improve the following deficits and impairments:  Abnormal gait, Decreased balance, Difficulty walking  Visit Diagnosis: Unsteadiness on feet  History of falling     Problem List Patient Active Problem List   Diagnosis Date Noted  . Cardiac pacemaker in situ 01/20/2020  . Sick sinus syndrome ([place]) 01/20/2020   Phillips Grout PT, DPT, GCS  Jeylin Woodmansee 01/24/2020, 3:52 PM  [place] MAIN [facility] SERVICES [address] [place], [place], [address] Phone: [phone]   Fax:  [phone]  Name: DEZJUAN COPUS MRN: [id number] Date of Birth: Jan 07, 1930

## 2020-01-26 ENCOUNTER — Other Ambulatory Visit: Payer: Self-pay

## 2020-01-26 ENCOUNTER — Ambulatory Visit: Payer: MEDICARE

## 2020-01-26 DIAGNOSIS — R2681 Unsteadiness on feet: Secondary | ICD-10-CM

## 2020-01-26 DIAGNOSIS — Z9181 History of falling: Secondary | ICD-10-CM

## 2020-01-26 NOTE — Therapy (Signed)
[place] MAIN [facility] SERVICES [address] [place], [place], [address] Phone: [phone]   Fax:  [phone]  Physical Therapy Treatment  Patient Details  Name: Samuel Mack MRN: [id number] Date of Birth: 05-26-1930 Referring Provider (PT): Dr. Manuella Ghazi   Encounter Date: 01/26/2020  PT End of Session - 01/26/20 1528    Visit Number  6    Number of Visits  25    Date for PT Re-Evaluation  03/30/20    Authorization Type  eval: 01/06/20    PT Start Time  1517    PT Stop Time  1600    PT Time Calculation (min)  43 min    Equipment Utilized During Treatment  Gait belt    Activity Tolerance  Patient tolerated treatment well    Behavior During Therapy  [facility] for tasks assessed/performed       Past Medical History:  Diagnosis Date  . Asthma   . Cancer ([place])    skin  . Complication of anesthesia    previous pacemaker patient could feel everything  . Diabetes mellitus without complication (HCC)    diet controlled  . Dyspnea   . Dysrhythmia   . Elevated lipids   . Hypertension   . Hypothyroidism   . Myocardial infarction ([place])   . Presence of permanent cardiac pacemaker     Past Surgical History:  Procedure Laterality Date  . CATARACT EXTRACTION W/PHACO Left 12/11/2016   Procedure: CATARACT EXTRACTION PHACO AND INTRAOCULAR LENS PLACEMENT (IOC);  Surgeon: Leandrew Koyanagi, MD;  Location: [place];  Service: Ophthalmology;  Laterality: Left;  IVA TOPICAL LEFT  . EYE SURGERY Left    cataract  . NASAL SINUS SURGERY    . PACEMAKER GENERATOR CHANGE Left    x2  . PACEMAKER INSERTION    . PACEMAKER INSERTION Left 05/12/2019   Procedure: INSERTION PACEMAKER, LEFT;  Surgeon: Cletis Athens, MD;  Location: [facility];  Service: Cardiovascular;  Laterality: Left;  . PROSTATE SURGERY      There were no vitals filed for this visit.  Subjective Assessment - 01/26/20 1527    Subjective  Pt is doing well today. No falls since last visit.  No recent changes in health or medications. No soreness reported upon arrival today. He was having some back pain last night but not today. No specific questions or concerns at time of arrival.    Pertinent History  Pt referred for difficulty with balance. Pt and daughter report that his difficulty with balance is progressive over the period of multiple years. Daughter states that patient's overall function has declined since his wife passed approximately 7 years ago. Per neurology note pt with possible peripheral neuropathy but currently holding off on NCS. Per daughter all labwork ordered at last visit was normal (CBC, CMP, TSH, Vit B12, Vit D). Pt has a pacemaker and reports that it is functioning well per cardiology. He states that he has had it replaced multiple times and was advised not to lift his arms over his head or lay on his chest due to concerns that he may dislodge the leads.    Limitations  Walking    Diagnostic tests  No head CT in chart, can't get brain MRI due to pacemaker    Patient Stated Goals  Improve balance and decrease risk for falls    Currently in Pain?  No/denies           TREATMENT   Ther-ex NuStep L2-5 for warm-up  during history x 5 minutes (4 minutes unbilled); Quantum BLE leg press 75# x 20, 85# x 20;  Seated marches with 5# ankle weights (AW) x 20 BLE; Seated LAQ with 5# AW x 20 BLE; Seated clams with green tband, 2s hold x 20 BLE; Seated adductor ball squeeze 2s hold x 20 BLE;  Standing exercises with 5# ankle weights (AW) x 20 BLE: Marches Hip abduction HS curls Hip extension  Matrix resisted gait 17.5# forward/backward/R lateral/L lateral x 2 each direction;   Neuromuscular Re-education 6" alternating step-ups without UE support x 10 each; Sit to stand from regular height chair without UE support and Airex pad under feet x 10; Airex NBOS dynamic basketball tosses without UE support in // bars;   Pt educated throughout session about  proper posture and technique with exercises. Improved exercise technique, movement at target joints, use of target muscles after min to mod verbal, visual, tactile cues.   Pt demonstrates excellent motivation during session today. Continued with strength and balance activities with pt today. Progress resistance on leg press today and also initiated resisted gait. Pt continued to require intermittent seated rest breaks between exercises but continues to demonstrate improved endurance from one session to the next. He reports that he is noticing improved stability with his walking as well as walking slightly faster now. No HEP modifications today. Pt encouraged to follow up as scheduled.Pt will benefit from PT services to address deficits in strength, balance, and mobility in order to return to full function at home.                            PT Short Term Goals - 01/07/20 1215      PT SHORT TERM GOAL #1   Title  Pt will be independent with HEP in order to improve strength and balance in order to decrease fall risk and improve function at home.    Time  6    Period  Weeks    Status  New    Target Date  02/17/20        PT Long Term Goals - 01/07/20 1215      PT LONG TERM GOAL #1   Title  Pt will improve BERG by at least 3 points in order to demonstrate clinically significant improvement in balance.    Baseline  01/06/20: 48/56    Time  12    Period  Weeks    Status  New    Target Date  03/30/20      PT LONG TERM GOAL #2   Title  Pt will increase FOTO to at least 67 in order to demonstrate clinically significant improvement in function at home to decrease fall risk.    Time  12    Period  Weeks    Status  New    Target Date  03/30/20      PT LONG TERM GOAL #3   Title  Pt will report moderate improvement in his balance since starting therapy to demonstrate imrpovement in balance confidence and to decrease fall risk (pt unable to understand ABC scale)     Time  12    Period  Weeks    Status  New    Target Date  03/30/20      PT LONG TERM GOAL #4   Title  Pt will increase self-selected 10MWT by at least 0.13 m/s in order to demonstrate clinically significant improvement in community  ambulation.    Baseline  01/06/20: Self-selected: 11.8s =  0.85 m/s    Time  12    Period  Weeks    Status  New    Target Date  03/31/20            Plan - 01/26/20 1528    Clinical Impression Statement  Pt demonstrates excellent motivation during session today. Continued with strength and balance activities with pt today. Progress resistance on leg press today and also initiated resisted gait. Pt continued to require intermittent seated rest breaks between exercises but continues to demonstrate improved endurance from one session to the next. He reports that he is noticing improved stability with his walking as well as walking slightly faster now. No HEP modifications today. Pt encouraged to follow up as scheduled. Pt will benefit from PT services to address deficits in strength, balance, and mobility in order to return to full function at home.    Personal Factors and Comorbidities  Age;Comorbidity 3+;Time since onset of injury/illness/exacerbation    Comorbidities  HTN, MI, pacemaker    Examination-Activity Limitations  Caring for Others;Locomotion Level;Reach Overhead;Squat    Examination-Participation Restrictions  Church;Community Activity;Yard Work    [employer] officer  Evolving/Moderate complexity    Rehab Potential  Good    PT Frequency  2x / week    PT Duration  12 weeks    PT Treatment/Interventions  ADLs/Self Care Home Management;Aquatic Therapy;Biofeedback;Canalith Repostioning;Cryotherapy;Electrical Stimulation;Iontophoresis 4mg /ml Dexamethasone;Moist Heat;Traction;Ultrasound;DME Instruction;Gait training;Stair training;Functional mobility training;Therapeutic activities;Therapeutic exercise;Balance training;Neuromuscular  re-education;Cognitive remediation;Patient/family education;Manual techniques;Passive range of motion;Dry needling;Vestibular    PT Next Visit Plan  Continue balance and strength training    PT Home Exercise Plan  Access Code: [email or web address]    Consulted and Agree with Plan of Care  Patient;Family member/caregiver    Family Member Consulted  Daughter       Patient will benefit from skilled therapeutic intervention in order to improve the following deficits and impairments:  Abnormal gait, Decreased balance, Difficulty walking  Visit Diagnosis: Unsteadiness on feet  History of falling     Problem List Patient Active Problem List   Diagnosis Date Noted  . Cardiac pacemaker in situ 01/20/2020  . Sick sinus syndrome ([place]) 01/20/2020   Phillips Grout PT, DPT, GCS  Samuel Mack 01/26/2020, 4:22 PM  [place] MAIN [facility] SERVICES [address] [place], [place], [address] Phone: [phone]   Fax:  [phone]  Name: Samuel Mack MRN: [id number] Date of Birth: 1930/01/11

## 2020-01-31 ENCOUNTER — Other Ambulatory Visit: Payer: Self-pay

## 2020-01-31 ENCOUNTER — Ambulatory Visit: Payer: MEDICARE

## 2020-01-31 DIAGNOSIS — Z9181 History of falling: Secondary | ICD-10-CM

## 2020-01-31 DIAGNOSIS — R2681 Unsteadiness on feet: Secondary | ICD-10-CM | POA: Diagnosis not present

## 2020-01-31 NOTE — Therapy (Signed)
[place] MAIN [facility] SERVICES [address] [place], [place], [address] Phone: [phone]   Fax:  [phone]  Physical Therapy Treatment  Patient Details  Name: Samuel Mack MRN: [id number] Date of Birth: November 26, 1929 Referring Provider (PT): Dr. Manuella Ghazi   Encounter Date: 01/31/2020  PT End of Session - 01/31/20 1526    Visit Number  7    Number of Visits  25    Date for PT Re-Evaluation  03/30/20    Authorization Type  eval: 01/06/20    PT Start Time  1520    PT Stop Time  1603    PT Time Calculation (min)  43 min    Equipment Utilized During Treatment  Gait belt    Activity Tolerance  Patient tolerated treatment well    Behavior During Therapy  [facility] for tasks assessed/performed       Past Medical History:  Diagnosis Date  . Asthma   . Cancer ([place])    skin  . Complication of anesthesia    previous pacemaker patient could feel everything  . Diabetes mellitus without complication (HCC)    diet controlled  . Dyspnea   . Dysrhythmia   . Elevated lipids   . Hypertension   . Hypothyroidism   . Myocardial infarction ([place])   . Presence of permanent cardiac pacemaker     Past Surgical History:  Procedure Laterality Date  . CATARACT EXTRACTION W/PHACO Left 12/11/2016   Procedure: CATARACT EXTRACTION PHACO AND INTRAOCULAR LENS PLACEMENT (IOC);  Surgeon: Leandrew Koyanagi, MD;  Location: [place];  Service: Ophthalmology;  Laterality: Left;  IVA TOPICAL LEFT  . EYE SURGERY Left    cataract  . NASAL SINUS SURGERY    . PACEMAKER GENERATOR CHANGE Left    x2  . PACEMAKER INSERTION    . PACEMAKER INSERTION Left 05/12/2019   Procedure: INSERTION PACEMAKER, LEFT;  Surgeon: Cletis Athens, MD;  Location: [facility];  Service: Cardiovascular;  Laterality: Left;  . PROSTATE SURGERY      There were no vitals filed for this visit.  Subjective Assessment - 01/31/20 1524    Subjective  Pt is doing well today. No falls since last visit  but he does report that he almost fell today when he stepped out of the car onto the pine needles. No recent changes in health or medications. No soreness reported upon arrival today. No specific questions at time of arrival.    Pertinent History  Pt referred for difficulty with balance. Pt and daughter report that his difficulty with balance is progressive over the period of multiple years. Daughter states that patient's overall function has declined since his wife passed approximately 7 years ago. Per neurology note pt with possible peripheral neuropathy but currently holding off on NCS. Per daughter all labwork ordered at last visit was normal (CBC, CMP, TSH, Vit B12, Vit D). Pt has a pacemaker and reports that it is functioning well per cardiology. He states that he has had it replaced multiple times and was advised not to lift his arms over his head or lay on his chest due to concerns that he may dislodge the leads.    Limitations  Walking    Diagnostic tests  No head CT in chart, can't get brain MRI due to pacemaker    Patient Stated Goals  Improve balance and decrease risk for falls    Currently in Pain?  No/denies         TREATMENT  Ther-ex NuStep L2-5 for warm-up during history x 5 minutes (4 minutes unbilled); Quantum BLE leg press 85# x 20, 100# x 20; 6" step-up alternating lead LE without UE support x 10 on each side; Sit to stand from regular height chair hold 5kg med ball and Airex pad under feet x 10;   Neuromuscular Re-education 6" alternating step-ups without UE support x 10 each; Tandem balance on 2"x4" alternating forward LE x 30s each; Tandem gait on 2"x4", 8' x 4 lengths; Side stepping on 2"x4", 8' x 4 lengths; 6" orange hurdle forward partial step-over/back without UE support x 10 on each side;   Pt educated throughout session about proper posture and technique with exercises. Improved exercise technique, movement at target joints, use of target muscles after  min to mod verbal, visual, tactile cues.   Pt demonstrates excellent motivation during session today. Continued with strength and balance activities with pt today. Progressed resistance on leg press today. Pt continued to require intermittent seated rest breaks between exercises especially after sit to stands. He reports that he is noticing improved stability with his walking with the exception of his stumble today. His daughter reports that he has good and bad days especially variable based on his sleep the previous night. But overall she believes that he is improving. No HEP modifications today. Pt encouraged to follow up as scheduled.Pt will benefit from PT services to address deficits in strength, balance, and mobility in order to return to full function at home.                          PT Short Term Goals - 01/07/20 1215      PT SHORT TERM GOAL #1   Title  Pt will be independent with HEP in order to improve strength and balance in order to decrease fall risk and improve function at home.    Time  6    Period  Weeks    Status  New    Target Date  02/17/20        PT Long Term Goals - 01/07/20 1215      PT LONG TERM GOAL #1   Title  Pt will improve BERG by at least 3 points in order to demonstrate clinically significant improvement in balance.    Baseline  01/06/20: 48/56    Time  12    Period  Weeks    Status  New    Target Date  03/30/20      PT LONG TERM GOAL #2   Title  Pt will increase FOTO to at least 67 in order to demonstrate clinically significant improvement in function at home to decrease fall risk.    Time  12    Period  Weeks    Status  New    Target Date  03/30/20      PT LONG TERM GOAL #3   Title  Pt will report moderate improvement in his balance since starting therapy to demonstrate imrpovement in balance confidence and to decrease fall risk (pt unable to understand ABC scale)    Time  12    Period  Weeks    Status  New    Target  Date  03/30/20      PT LONG TERM GOAL #4   Title  Pt will increase self-selected 10MWT by at least 0.13 m/s in order to demonstrate clinically significant improvement in community ambulation.    Baseline  01/06/20:  Self-selected: 11.8s =  0.85 m/s    Time  12    Period  Weeks    Status  New    Target Date  03/31/20            Plan - 01/31/20 1526    Clinical Impression Statement  Pt demonstrates excellent motivation during session today. Continued with strength and balance activities with pt today. Progressed resistance on leg press today. Pt continued to require intermittent seated rest breaks between exercises especially after sit to stands. He reports that he is noticing improved stability with his walking with the exception of his stumble today. His daughter reports that he has good and bad days especially variable based on his sleep the previous night. But overall she believes that he is improving. No HEP modifications today. Pt encouraged to follow up as scheduled. Pt will benefit from PT services to address deficits in strength, balance, and mobility in order to return to full function at home.    Personal Factors and Comorbidities  Age;Comorbidity 3+;Time since onset of injury/illness/exacerbation    Comorbidities  HTN, MI, pacemaker    Examination-Activity Limitations  Caring for Others;Locomotion Level;Reach Overhead;Squat    Examination-Participation Restrictions  Church;Community Activity;Yard Work    [employer] officer  Evolving/Moderate complexity    Rehab Potential  Good    PT Frequency  2x / week    PT Duration  12 weeks    PT Treatment/Interventions  ADLs/Self Care Home Management;Aquatic Therapy;Biofeedback;Canalith Repostioning;Cryotherapy;Electrical Stimulation;Iontophoresis 4mg /ml Dexamethasone;Moist Heat;Traction;Ultrasound;DME Instruction;Gait training;Stair training;Functional mobility training;Therapeutic activities;Therapeutic exercise;Balance  training;Neuromuscular re-education;Cognitive remediation;Patient/family education;Manual techniques;Passive range of motion;Dry needling;Vestibular    PT Next Visit Plan  Continue balance and strength training    PT Home Exercise Plan  Access Code: [email or web address]    Consulted and Agree with Plan of Care  Patient;Family member/caregiver    Family Member Consulted  Daughter       Patient will benefit from skilled therapeutic intervention in order to improve the following deficits and impairments:  Abnormal gait, Decreased balance, Difficulty walking  Visit Diagnosis: Unsteadiness on feet  History of falling     Problem List Patient Active Problem List   Diagnosis Date Noted  . Cardiac pacemaker in situ 01/20/2020  . Sick sinus syndrome ([place]) 01/20/2020   Phillips Grout PT, DPT, GCS  Lynnox Girten 02/01/2020, 8:24 AM  [place] MAIN [facility] SERVICES [address] [place], [place], [address] Phone: [phone]   Fax:  [phone]  Name: Samuel Mack MRN: [id number] Date of Birth: 1930/07/04

## 2020-02-02 ENCOUNTER — Other Ambulatory Visit: Payer: Self-pay

## 2020-02-02 ENCOUNTER — Ambulatory Visit: Payer: MEDICARE

## 2020-02-02 DIAGNOSIS — R2681 Unsteadiness on feet: Secondary | ICD-10-CM | POA: Diagnosis not present

## 2020-02-02 DIAGNOSIS — Z9181 History of falling: Secondary | ICD-10-CM

## 2020-02-02 NOTE — Therapy (Signed)
[place] MAIN [facility] SERVICES [address] [place], [place], [address] Phone: [phone]   Fax:  [phone]  Physical Therapy Treatment  Patient Details  Name: Samuel Mack MRN: [id number] Date of Birth: 10/06/1929 Referring Provider (PT): Dr. Manuella Ghazi   Encounter Date: 02/02/2020  PT End of Session - 02/02/20 1604    Visit Number  8    Number of Visits  25    Date for PT Re-Evaluation  03/30/20    Authorization Type  eval: 01/06/20    PT Start Time  1601    PT Stop Time  1645    PT Time Calculation (min)  44 min    Equipment Utilized During Treatment  Gait belt    Activity Tolerance  Patient tolerated treatment well    Behavior During Therapy  [facility] for tasks assessed/performed       Past Medical History:  Diagnosis Date  . Asthma   . Cancer ([place])    skin  . Complication of anesthesia    previous pacemaker patient could feel everything  . Diabetes mellitus without complication (HCC)    diet controlled  . Dyspnea   . Dysrhythmia   . Elevated lipids   . Hypertension   . Hypothyroidism   . Myocardial infarction ([place])   . Presence of permanent cardiac pacemaker     Past Surgical History:  Procedure Laterality Date  . CATARACT EXTRACTION W/PHACO Left 12/11/2016   Procedure: CATARACT EXTRACTION PHACO AND INTRAOCULAR LENS PLACEMENT (IOC);  Surgeon: Leandrew Koyanagi, MD;  Location: [place];  Service: Ophthalmology;  Laterality: Left;  IVA TOPICAL LEFT  . EYE SURGERY Left    cataract  . NASAL SINUS SURGERY    . PACEMAKER GENERATOR CHANGE Left    x2  . PACEMAKER INSERTION    . PACEMAKER INSERTION Left 05/12/2019   Procedure: INSERTION PACEMAKER, LEFT;  Surgeon: Cletis Athens, MD;  Location: [facility];  Service: Cardiovascular;  Laterality: Left;  . PROSTATE SURGERY      There were no vitals filed for this visit.  Subjective Assessment - 02/02/20 1603    Subjective  Pt is not doing great today. He had dental work  done yesterday and didn't sleep well last night. No falls since last visit. No specific questions at time of arrival.    Pertinent History  Pt referred for difficulty with balance. Pt and daughter report that his difficulty with balance is progressive over the period of multiple years. Daughter states that patient's overall function has declined since his wife passed approximately 7 years ago. Per neurology note pt with possible peripheral neuropathy but currently holding off on NCS. Per daughter all labwork ordered at last visit was normal (CBC, CMP, TSH, Vit B12, Vit D). Pt has a pacemaker and reports that it is functioning well per cardiology. He states that he has had it replaced multiple times and was advised not Mack lift his arms over his head or lay on his chest due Mack concerns that he may dislodge the leads.    Limitations  Walking    Diagnostic tests  No head CT in chart, can't get brain MRI due Mack pacemaker    Patient Stated Goals  Improve balance and decrease risk for falls    Currently in Pain?  No/denies   He does have some "dental soreness"         TREATMENT   Ther-ex NuStep L3-5 for warm-up during history x 5 minutes (3 minutes  unbilled); Quantum BLE leg press 100# 2 x 20;  Standing exercises with 5# ankle weights (AW) x 20 each: Hip flexion marching; Hip abduction; HS curls; Hip extension;  Standing heel raises with BUE support x 20; Sit Mack stand from regular height chair without UE support x 10; Standing squats without UE support x 10;   Neuromuscular Re-education All exercises performed in // bars: Rockerboard balance in A/P and R/L direction x 30s each; Rockerboard weight shifting in A/P and R/L direction x 30s each; Rockerboard A/P orientation horizontal and vertical head turns x 30s each; Rockerboard R/L orientation horizontal and vertical head turns x 30s each;   Pt educated throughout session about proper posture and technique with exercises.  Improved exercise technique, movement at target joints, use of target muscles after min Mack mod verbal, visual, tactile cues.   Pt demonstrates excellent motivation during session today. Continued with strength and balance activities with pt today. Progressed resistance on leg press today Mack perform two reps Mack 100# each. Pt continued Mack require intermittent seated rest breaks between exercises but endurance improves Mack be improving. Incorporated rockerboard into session today Mack challenge balance. No HEP modifications today. Pt encouraged Mack follow up as scheduled.Pt will benefit from PT services Mack address deficits in strength, balance, and mobility in order Mack return Mack full function at home.                          PT Short Term Goals - 01/07/20 1215      PT SHORT TERM GOAL #1   Title  Pt will be independent with HEP in order Mack improve strength and balance in order Mack decrease fall risk and improve function at home.    Time  6    Period  Weeks    Status  New    Target Date  02/17/20        PT Long Term Goals - 01/07/20 1215      PT LONG TERM GOAL #1   Title  Pt will improve BERG by at least 3 points in order Mack demonstrate clinically significant improvement in balance.    Baseline  01/06/20: 48/56    Time  12    Period  Weeks    Status  New    Target Date  03/30/20      PT LONG TERM GOAL #2   Title  Pt will increase FOTO Mack at least 67 in order Mack demonstrate clinically significant improvement in function at home Mack decrease fall risk.    Time  12    Period  Weeks    Status  New    Target Date  03/30/20      PT LONG TERM GOAL #3   Title  Pt will report moderate improvement in his balance since starting therapy Mack demonstrate imrpovement in balance confidence and Mack decrease fall risk (pt unable Mack understand ABC scale)    Time  12    Period  Weeks    Status  New    Target Date  03/30/20      PT LONG TERM GOAL #4   Title  Pt will increase  self-selected 10MWT by at least 0.13 m/s in order Mack demonstrate clinically significant improvement in community ambulation.    Baseline  01/06/20: Self-selected: 11.8s =  0.85 m/s    Time  12    Period  Weeks    Status  New  Target Date  03/31/20            Plan - 02/02/20 1604    Clinical Impression Statement  Pt demonstrates excellent motivation during session today. Continued with strength and balance activities with pt today. Progressed resistance on leg press today Mack perform two reps Mack 100# each. Pt continued Mack require intermittent seated rest breaks between exercises but endurance improves Mack be improving. Incorporated rockerboard into session today Mack challenge balance. No HEP modifications today. Pt encouraged Mack follow up as scheduled. Pt will benefit from PT services Mack address deficits in strength, balance, and mobility in order Mack return Mack full function at home.    Personal Factors and Comorbidities  Age;Comorbidity 3+;Time since onset of injury/illness/exacerbation    Comorbidities  HTN, MI, pacemaker    Examination-Activity Limitations  Caring for Others;Locomotion Level;Reach Overhead;Squat    Examination-Participation Restrictions  Church;Community Activity;Yard Work    [employer] officer  Evolving/Moderate complexity    Rehab Potential  Good    PT Frequency  2x / week    PT Duration  12 weeks    PT Treatment/Interventions  ADLs/Self Care Home Management;Aquatic Therapy;Biofeedback;Canalith Repostioning;Cryotherapy;Electrical Stimulation;Iontophoresis 4mg /ml Dexamethasone;Moist Heat;Traction;Ultrasound;DME Instruction;Gait training;Stair training;Functional mobility training;Therapeutic activities;Therapeutic exercise;Balance training;Neuromuscular re-education;Cognitive remediation;Patient/family education;Manual techniques;Passive range of motion;Dry needling;Vestibular    PT Next Visit Plan  Continue balance and strength training    PT Home Exercise  Plan  Access Code: [email or web address]    Consulted and Agree with Plan of Care  Patient;Family member/caregiver    Family Member Consulted  Daughter       Patient will benefit from skilled therapeutic intervention in order Mack improve the following deficits and impairments:  Abnormal gait, Decreased balance, Difficulty walking  Visit Diagnosis: Unsteadiness on feet  History of falling     Problem List Patient Active Problem List   Diagnosis Date Noted  . Cardiac pacemaker in situ 01/20/2020  . Sick sinus syndrome ([place]) 01/20/2020   Phillips Grout PT, DPT, GCS  Ambriel Gorelick 02/03/2020, 10:01 AM  [place] MAIN [facility] SERVICES [address] [place], [place], [address] Phone: [phone]   Fax:  [phone]  Name: Samuel Mack MRN: [id number] Date of Birth: 05/03/1930

## 2020-02-07 ENCOUNTER — Other Ambulatory Visit: Payer: Self-pay

## 2020-02-07 ENCOUNTER — Ambulatory Visit: Payer: MEDICARE

## 2020-02-07 DIAGNOSIS — R2681 Unsteadiness on feet: Secondary | ICD-10-CM | POA: Diagnosis not present

## 2020-02-07 DIAGNOSIS — Z9181 History of falling: Secondary | ICD-10-CM

## 2020-02-07 NOTE — Therapy (Signed)
[place] MAIN [facility] SERVICES [address] [place], [place], [address] Phone: [phone]   Fax:  [phone]  Physical Therapy Treatment  Patient Details  Name: Samuel Mack MRN: [id number] Date of Birth: November 12, 1929 Referring Provider (PT): Dr. Manuella Ghazi   Encounter Date: 02/07/2020  PT End of Session - 02/07/20 1437    Visit Number  9    Number of Visits  25    Date for PT Re-Evaluation  03/30/20    Authorization Type  eval: 01/06/20    PT Start Time  1432    PT Stop Time  1515    PT Time Calculation (min)  43 min    Equipment Utilized During Treatment  Gait belt    Activity Tolerance  Patient tolerated treatment well    Behavior During Therapy  [facility] for tasks assessed/performed       Past Medical History:  Diagnosis Date  . Asthma   . Cancer ([place])    skin  . Complication of anesthesia    previous pacemaker patient could feel everything  . Diabetes mellitus without complication (HCC)    diet controlled  . Dyspnea   . Dysrhythmia   . Elevated lipids   . Hypertension   . Hypothyroidism   . Myocardial infarction ([place])   . Presence of permanent cardiac pacemaker     Past Surgical History:  Procedure Laterality Date  . CATARACT EXTRACTION W/PHACO Left 12/11/2016   Procedure: CATARACT EXTRACTION PHACO AND INTRAOCULAR LENS PLACEMENT (IOC);  Surgeon: Leandrew Koyanagi, MD;  Location: [place];  Service: Ophthalmology;  Laterality: Left;  IVA TOPICAL LEFT  . EYE SURGERY Left    cataract  . NASAL SINUS SURGERY    . PACEMAKER GENERATOR CHANGE Left    x2  . PACEMAKER INSERTION    . PACEMAKER INSERTION Left 05/12/2019   Procedure: INSERTION PACEMAKER, LEFT;  Surgeon: Cletis Athens, MD;  Location: [facility];  Service: Cardiovascular;  Laterality: Left;  . PROSTATE SURGERY      There were no vitals filed for this visit.  Subjective Assessment - 02/07/20 1435    Subjective  Pt is well today. No falls since last visit. Not  complaining of any dental pain recently per daughter. No specific questions at time of arrival.    Pertinent History  Pt referred for difficulty with balance. Pt and daughter report that his difficulty with balance is progressive over the period of multiple years. Daughter states that patient's overall function has declined since his wife passed approximately 7 years ago. Per neurology note pt with possible peripheral neuropathy but currently holding off on NCS. Per daughter all labwork ordered at last visit was normal (CBC, CMP, TSH, Vit B12, Vit D). Pt has a pacemaker and reports that it is functioning well per cardiology. He states that he has had it replaced multiple times and was advised not to lift his arms over his head or lay on his chest due to concerns that he may dislodge the leads.    Limitations  Walking    Diagnostic tests  No head CT in chart, can't get brain MRI due to pacemaker    Patient Stated Goals  Improve balance and decrease risk for falls    Currently in Pain?  No/denies         TREATMENT   Ther-ex NuStep L2-37for warm-up during history x 5 minutes (3 minutes unbilled); Quantum BLE leg press 105# x 20, 115# x 20; Matrix resisted walking  17.5# forward/backwards/R lateral/L lateral x 3 each direction; 6" alternating step-ups without UE support x 10 leading with each leg; Sit to stand from regular height chair with Airex pad under feet x 10;   Neuromuscular Re-education All exercises performed in // bars: Alternating 6" step taps with "stepping stone" on tap alternating LE, cues to tap lightly, x 10 BLE; NBOS Airex cone passes around body with therapist adjusting distance and direction of cones x multiple bouts on each side;   Pt educated throughout session about proper posture and technique with exercises. Improved exercise technique, movement at target joints, use of target muscles after min to mod verbal, visual, tactile cues.   Pt demonstrates  excellent motivation during session today.Continued withstrength and balance activities with pt today. Progressed resistance on leg press again today. Endurance appears to be improving with each session however pt does report some mild DOE however SpO2 remains >95% throughout entire session. No HEP modifications today. Pt encouraged to follow up as scheduled.Pt will benefit from PT services to address deficits in strength, balance, and mobility in order to return to full function at home.                          PT Short Term Goals - 01/07/20 1215      PT SHORT TERM GOAL #1   Title  Pt will be independent with HEP in order to improve strength and balance in order to decrease fall risk and improve function at home.    Time  6    Period  Weeks    Status  New    Target Date  02/17/20        PT Long Term Goals - 01/07/20 1215      PT LONG TERM GOAL #1   Title  Pt will improve BERG by at least 3 points in order to demonstrate clinically significant improvement in balance.    Baseline  01/06/20: 48/56    Time  12    Period  Weeks    Status  New    Target Date  03/30/20      PT LONG TERM GOAL #2   Title  Pt will increase FOTO to at least 67 in order to demonstrate clinically significant improvement in function at home to decrease fall risk.    Time  12    Period  Weeks    Status  New    Target Date  03/30/20      PT LONG TERM GOAL #3   Title  Pt will report moderate improvement in his balance since starting therapy to demonstrate imrpovement in balance confidence and to decrease fall risk (pt unable to understand ABC scale)    Time  12    Period  Weeks    Status  New    Target Date  03/30/20      PT LONG TERM GOAL #4   Title  Pt will increase self-selected 10MWT by at least 0.13 m/s in order to demonstrate clinically significant improvement in community ambulation.    Baseline  01/06/20: Self-selected: 11.8s =  0.85 m/s    Time  12    Period  Weeks     Status  New    Target Date  03/31/20            Plan - 02/07/20 1437    Clinical Impression Statement  Pt demonstrates excellent motivation during session today. Continued with strength and  balance activities with pt today. Progressed resistance on leg press again today. Endurance appears to be improving with each session however pt does report some mild DOE however SpO2 remains >95% throughout entire session. No HEP modifications today. Pt encouraged to follow up as scheduled. Pt will benefit from PT services to address deficits in strength, balance, and mobility in order to return to full function at home.    Personal Factors and Comorbidities  Age;Comorbidity 3+;Time since onset of injury/illness/exacerbation    Comorbidities  HTN, MI, pacemaker    Examination-Activity Limitations  Caring for Others;Locomotion Level;Reach Overhead;Squat    Examination-Participation Restrictions  Church;Community Activity;Yard Work    [employer] officer  Evolving/Moderate complexity    Rehab Potential  Good    PT Frequency  2x / week    PT Duration  12 weeks    PT Treatment/Interventions  ADLs/Self Care Home Management;Aquatic Therapy;Biofeedback;Canalith Repostioning;Cryotherapy;Electrical Stimulation;Iontophoresis 4mg /ml Dexamethasone;Moist Heat;Traction;Ultrasound;DME Instruction;Gait training;Stair training;Functional mobility training;Therapeutic activities;Therapeutic exercise;Balance training;Neuromuscular re-education;Cognitive remediation;Patient/family education;Manual techniques;Passive range of motion;Dry needling;Vestibular    PT Next Visit Plan  Continue balance and strength training    PT Home Exercise Plan  Access Code: [email or web address]    Consulted and Agree with Plan of Care  Patient;Family member/caregiver    Family Member Consulted  Daughter       Patient will benefit from skilled therapeutic intervention in order to improve the following deficits and impairments:  Abnormal  gait, Decreased balance, Difficulty walking  Visit Diagnosis: Unsteadiness on feet  History of falling     Problem List Patient Active Problem List   Diagnosis Date Noted  . Cardiac pacemaker in situ 01/20/2020  . Sick sinus syndrome ([place]) 01/20/2020   Phillips Grout PT, DPT, GCS  Rulon Abdalla 02/07/2020, 5:26 PM  [place] MAIN [facility] SERVICES [address] [place], [place], [address] Phone: [phone]   Fax:  [phone]  Name: Samuel Mack MRN: [id number] Date of Birth: 1929-10-31

## 2020-02-09 ENCOUNTER — Other Ambulatory Visit: Payer: Self-pay

## 2020-02-09 ENCOUNTER — Ambulatory Visit: Payer: MEDICARE

## 2020-02-09 DIAGNOSIS — R2681 Unsteadiness on feet: Secondary | ICD-10-CM | POA: Diagnosis not present

## 2020-02-09 DIAGNOSIS — Z9181 History of falling: Secondary | ICD-10-CM

## 2020-02-09 NOTE — Therapy (Signed)
[place] MAIN [facility] SERVICES [address] [place], [place], [address] Phone: [phone]   Fax:  [phone]  Physical Therapy Progress Note/Discharge   Dates of reporting period  01/06/20   to   02/09/20  Patient Details  Name: Samuel Mack MRN: [id number] Date of Birth: 1930/08/16 Referring Provider (PT): Dr. Manuella Ghazi   Encounter Date: 02/09/2020  PT End of Session - 02/09/20 1430    Visit Number  10    Number of Visits  25    Date for PT Re-Evaluation  03/30/20    Authorization Type  eval: 01/06/20    PT Start Time  1430    PT Stop Time  1510    PT Time Calculation (min)  40 min    Equipment Utilized During Treatment  Gait belt    Activity Tolerance  Patient tolerated treatment well    Behavior During Therapy  [facility] for tasks assessed/performed       Past Medical History:  Diagnosis Date  . Asthma   . Cancer ([place])    skin  . Complication of anesthesia    previous pacemaker patient could feel everything  . Diabetes mellitus without complication (HCC)    diet controlled  . Dyspnea   . Dysrhythmia   . Elevated lipids   . Hypertension   . Hypothyroidism   . Myocardial infarction ([place])   . Presence of permanent cardiac pacemaker     Past Surgical History:  Procedure Laterality Date  . CATARACT EXTRACTION W/PHACO Left 12/11/2016   Procedure: CATARACT EXTRACTION PHACO AND INTRAOCULAR LENS PLACEMENT (IOC);  Surgeon: Leandrew Koyanagi, MD;  Location: [place];  Service: Ophthalmology;  Laterality: Left;  IVA TOPICAL LEFT  . EYE SURGERY Left    cataract  . NASAL SINUS SURGERY    . PACEMAKER GENERATOR CHANGE Left    x2  . PACEMAKER INSERTION    . PACEMAKER INSERTION Left 05/12/2019   Procedure: INSERTION PACEMAKER, LEFT;  Surgeon: Cletis Athens, MD;  Location: [facility];  Service: Cardiovascular;  Laterality: Left;  . PROSTATE SURGERY      There were no vitals filed for this visit.  Subjective Assessment - 02/10/20  0938    Subjective  Pt is well today. No falls since last visit. No recent changes in health or medication. No specific questions at time of arrival.    Pertinent History  Pt referred for difficulty with balance. Pt and daughter report that his difficulty with balance is progressive over the period of multiple years. Daughter states that patient's overall function has declined since his wife passed approximately 7 years ago. Per neurology note pt with possible peripheral neuropathy but currently holding off on NCS. Per daughter all labwork ordered at last visit was normal (CBC, CMP, TSH, Vit B12, Vit D). Pt has a pacemaker and reports that it is functioning well per cardiology. He states that he has had it replaced multiple times and was advised not to lift his arms over his head or lay on his chest due to concerns that he may dislodge the leads.    Limitations  Walking    Diagnostic tests  No head CT in chart, can't get brain MRI due to pacemaker    Patient Stated Goals  Improve balance and decrease risk for falls    Currently in Pain?  No/denies         [facility] PT Assessment - 02/09/20 1449      Standardized Balance Assessment  Standardized Balance Assessment  Berg Balance Test;Dynamic Gait Index      Berg Balance Test   Sit to Stand  Able to stand without using hands and stabilize independently    Standing Unsupported  Able to stand safely 2 minutes    Sitting with Back Unsupported but Feet Supported on Floor or Stool  Able to sit safely and securely 2 minutes    Stand to Sit  Sits safely with minimal use of hands    Transfers  Able to transfer safely, minor use of hands    Standing Unsupported with Eyes Closed  Able to stand 10 seconds safely    Standing Unsupported with Feet Together  Able to place feet together independently and stand 1 minute safely    From Standing, Reach Forward with Outstretched Arm  Can reach confidently >25 cm (10")    From Standing Position, Pick up Object from  Floor  Able to pick up shoe safely and easily    From Standing Position, Turn to Look Behind Over each Shoulder  Looks behind from both sides and weight shifts well    Turn 360 Degrees  Able to turn 360 degrees safely in 4 seconds or less    Standing Unsupported, Alternately Place Feet on Step/Stool  Able to stand independently and safely and complete 8 steps in 20 seconds    Standing Unsupported, One Foot in Front  Able to plae foot ahead of the other independently and hold 30 seconds    Standing on One Leg  Tries to lift leg/unable to hold 3 seconds but remains standing independently    Total Score  52      Dynamic Gait Index   Level Surface  Normal    Change in Gait Speed  Normal    Gait with Horizontal Head Turns  Mild Impairment    Gait with Vertical Head Turns  Mild Impairment    Gait and Pivot Turn  Normal    Step Over Obstacle  Mild Impairment    Step Around Obstacles  Normal    Steps  Mild Impairment    Total Score  20         TREATMENT   Ther-ex Octane xRide L4fr warm-up during history x 5 minutes (2 minutes unbilled); Outcome measures and goals updated with patient (see below); Quantum BLE leg press 115# x 20; Extensive education with patient and daughter regarding discharge, [facility];   FUNCTIONAL OUTCOME MEASURES     01/06/20 02/09/20 Comments  BERG 48/56 52/56 Significant improvement  TUG 11.0 seconds 8.6s WNL (improved from initial eval)  5TSTS 12.0 seconds 11.9s WNL  10 Meter Gait Speed Self-selected: 11.8s =  0.85 m/s; Fastest: 8.0s =  1.25 m/s Self-selected: 9.1s =  1.1 m/s; Fastest: 6.1s =  1.64 m/s WNL  ABC Scale   Pt unable to understand outcome measures fully so no performed  FOTO 64 64 Predicted improvement to 67  DGI  20/24      Pt educated throughout session about proper posture and technique with exercises and outcome measures.   Pt  demonstrates excellent motivation during session today.Updated outcome measures and goals with patient. His BERG has improved from 48/56 to 52/56 and his TUG decreased from 11.0s to 8.6s. 5TSTS unchanged. His 167mait speed improved significantly and his self-selected speed is now 1.1 m/s which is functional for full community ambulation. He continues  to have some higher level balance deficits in tandem and single leg stance. Pt is now independent with his HEP and has met 3 out of 4 of his long term goals. He will be discharged on this date. Pt and daughter are in agreement. Plan is for patient to continue independently and hopefully over the next two months get involved in a community based exercise program.                            PT Short Term Goals - 02/09/20 1435      PT SHORT TERM GOAL #1   Title  Pt will be independent with HEP in order to improve strength and balance in order to decrease fall risk and improve function at home.    Time  6    Period  Weeks    Status  Achieved    Target Date  --        PT Long Term Goals - 02/09/20 1435      PT LONG TERM GOAL #1   Title  Pt will improve BERG by at least 3 points in order to demonstrate clinically significant improvement in balance.    Baseline  01/06/20: 48/56; 02/09/20: 52/56    Time  12    Period  Weeks    Status  Achieved    Target Date  03/30/20      PT LONG TERM GOAL #2   Title  Pt will increase FOTO to at least 67 in order to demonstrate clinically significant improvement in function at home to decrease fall risk.    Baseline  01/06/20: 64, 02/09/20: 64    Time  12    Period  Weeks    Status  Not Met    Target Date  --      PT LONG TERM GOAL #3   Title  Pt will report moderate improvement in his balance since starting therapy to demonstrate imrpovement in balance confidence and to decrease fall risk (pt unable to understand ABC scale)    Baseline  02/09/20: "right much better"    Time  12     Period  Weeks    Status  Achieved    Target Date  03/30/20      PT LONG TERM GOAL #4   Title  Pt will increase self-selected 10MWT by at least 0.13 m/s in order to demonstrate clinically significant improvement in community ambulation.    Baseline  01/06/20: Self-selected: 11.8s =  0.85 m/s; 02/09/20: Self-selected: 9.1s = 1.1 m/s    Time  12    Period  Weeks    Status  New    Target Date  03/30/20            Plan - 02/10/20 0939    Clinical Impression Statement  Pt demonstrates excellent motivation during session today. Updated outcome measures and goals with patient. His BERG has improved from 48/56 to 52/56 and his TUG decreased from 11.0s to 8.6s. 5TSTS unchanged. His 56mgait speed improved significantly and his self-selected speed is now 1.1 m/s which is functional for full community ambulation. He continues to have some higher level balance deficits in tandem and single leg stance. Pt is now independent with his HEP and has met 3 out of 4 of his long term goals. He will be discharged on this date. Pt and daughter are in agreement. Plan is for patient to continue independently and  hopefully over the next two months get involved in a community based exercise program.    Personal Factors and Comorbidities  Age;Comorbidity 3+;Time since onset of injury/illness/exacerbation    Comorbidities  HTN, MI, pacemaker    Examination-Activity Limitations  Caring for Others;Locomotion Level;Reach Overhead;Squat    Examination-Participation Restrictions  Church;Community Activity;Yard Work    [employer] officer  Evolving/Moderate complexity    Rehab Potential  Good    PT Frequency  2x / week    PT Duration  12 weeks    PT Treatment/Interventions  ADLs/Self Care Home Management;Aquatic Therapy;Biofeedback;Canalith Repostioning;Cryotherapy;Electrical Stimulation;Iontophoresis '4mg'$ /ml Dexamethasone;Moist Heat;Traction;Ultrasound;DME Instruction;Gait training;Stair training;Functional  mobility training;Therapeutic activities;Therapeutic exercise;Balance training;Neuromuscular re-education;Cognitive remediation;Patient/family education;Manual techniques;Passive range of motion;Dry needling;Vestibular    PT Next Visit Plan  Continue balance and strength training    PT Home Exercise Plan  Access Code: [email or web address]    Consulted and Agree with Plan of Care  Patient;Family member/caregiver    Family Member Consulted  Daughter       Patient will benefit from skilled therapeutic intervention in order to improve the following deficits and impairments:  Abnormal gait, Decreased balance, Difficulty walking  Visit Diagnosis: Unsteadiness on feet  History of falling     Problem List Patient Active Problem List   Diagnosis Date Noted  . Cardiac pacemaker in situ 01/20/2020  . Sick sinus syndrome ([place]) 01/20/2020   Phillips Grout PT, DPT, GCS  Laruen Risser 02/10/2020, 9:49 AM  [place] MAIN [facility] SERVICES [address] [place], [place], [address] Phone: [phone]   Fax:  [phone]  Name: MAHER SHON MRN: [id number] Date of Birth: 11-26-1929

## 2020-02-21 ENCOUNTER — Ambulatory Visit: Payer: MEDICARE

## 2020-02-23 ENCOUNTER — Ambulatory Visit: Payer: MEDICARE

## 2020-02-28 ENCOUNTER — Ambulatory Visit: Payer: MEDICARE | Admitting: Physical Therapy

## 2020-03-01 ENCOUNTER — Ambulatory Visit: Payer: MEDICARE

## 2020-03-06 ENCOUNTER — Ambulatory Visit: Payer: MEDICARE

## 2020-03-08 ENCOUNTER — Ambulatory Visit: Payer: MEDICARE

## 2020-03-13 ENCOUNTER — Ambulatory Visit: Payer: MEDICARE

## 2020-03-15 ENCOUNTER — Ambulatory Visit: Payer: MEDICARE

## 2020-03-24 ENCOUNTER — Other Ambulatory Visit: Payer: Self-pay | Admitting: *Deleted

## 2020-03-24 MED ORDER — DIGOXIN 125 MCG PO TABS: 125.0000 ug | ORAL_TABLET | Freq: Every day | ORAL | 3 refills | Status: DC

## 2020-04-18 ENCOUNTER — Encounter: Payer: Self-pay | Admitting: Urology

## 2020-04-18 ENCOUNTER — Ambulatory Visit (INDEPENDENT_AMBULATORY_CARE_PROVIDER_SITE_OTHER): Payer: MEDICARE | Admitting: Urology

## 2020-04-18 ENCOUNTER — Other Ambulatory Visit: Payer: Self-pay

## 2020-04-18 VITALS — BP 120/78 | HR 96 | Ht 67.0 in | Wt 225.0 lb

## 2020-04-18 DIAGNOSIS — R399 Unspecified symptoms and signs involving the genitourinary system: Secondary | ICD-10-CM

## 2020-04-18 DIAGNOSIS — R350 Frequency of micturition: Secondary | ICD-10-CM | POA: Diagnosis not present

## 2020-04-18 DIAGNOSIS — R3911 Hesitancy of micturition: Secondary | ICD-10-CM | POA: Diagnosis not present

## 2020-04-18 DIAGNOSIS — N3941 Urge incontinence: Secondary | ICD-10-CM | POA: Diagnosis not present

## 2020-04-18 DIAGNOSIS — R3913 Splitting of urinary stream: Secondary | ICD-10-CM

## 2020-04-18 LAB — BLADDER SCAN AMB NON-IMAGING: Scan Result: 18

## 2020-04-19 NOTE — Progress Notes (Signed)
04/18/2020 7:22 AM   Samuel Mack Apr 21, 1930 [id number]  Referring provider: Benay Spice, PA-C [place],  [place] [address]  Chief Complaint  Patient presents with   Other    HPI: Samuel Mack is an 86 y.o. male seen at the request of Autumn Ashby Dawes, PA-C for evaluation of lower urinary tract symptoms.   Status post suprapubic prostatectomy for urinary retention by Dr. 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 with benign pathology  1-1.5 years after procedure states he noticed decreased penile length and complained of a split urinary stream  Symptoms of progressively worsened over the last several years  Most bothersome symptoms at present are urinary frequency, urgency, weak stream and variable nocturia from 3-7  IPSS today 30/35  Denies dysuria, gross hematuria  Denies flank, abdominal or pelvic pain  Symptoms exacerbated when upright and better when sitting or laying  No history of sleep apnea however at recent visit with urology a home sleep study was ordered  PMH: Past Medical History:  Diagnosis Date   Asthma    Cancer ([place])    skin   Complication of anesthesia    previous pacemaker patient could feel everything   Diabetes mellitus without complication ([place])    diet controlled   Dyspnea    Dysrhythmia    Elevated lipids    Hypertension    Hypothyroidism    Myocardial infarction [facility])    Presence of permanent cardiac pacemaker     Surgical History: Past Surgical History:  Procedure Laterality Date   CATARACT EXTRACTION W/PHACO Left 12/11/2016   Procedure: CATARACT EXTRACTION PHACO AND INTRAOCULAR LENS PLACEMENT ([place]);  Surgeon: Leandrew Koyanagi, MD;  Location: [facility];  Service: Ophthalmology;  Laterality: Left;  IVA TOPICAL LEFT   EYE SURGERY Left    cataract   NASAL SINUS SURGERY     PACEMAKER GENERATOR CHANGE Left    x2   PACEMAKER INSERTION     PACEMAKER INSERTION Left  05/12/2019   Procedure: INSERTION PACEMAKER, LEFT;  Surgeon: Cletis Athens, MD;  Location: [facility];  Service: Cardiovascular;  Laterality: Left;   PROSTATE SURGERY      Home Medications:  Allergies as of 04/18/2020      Reactions   Bee Venom Swelling   Penicillins Swelling   Patient reports he swells and it was "something in the pcn"   Amoxicillin Swelling      Medication List       Accurate as of April 18, 2020 11:59 PM. If you have any questions, ask your nurse or doctor.        acetaminophen 650 MG CR tablet Commonly known as: Tylenol 8 Hour Take 1 tablet (650 mg total) by mouth every 8 (eight) hours as needed for pain.   aspirin 81 MG tablet Take 81 mg by mouth daily.   atorvastatin 10 MG tablet Commonly known as: LIPITOR Take 10 mg by mouth at bedtime.   co-enzyme Q-10 30 MG capsule Take by mouth daily.   digoxin 0.125 MG tablet Commonly known as: LANOXIN Take 1 tablet (125 mcg total) by mouth daily.   Fish Oil 1200 MG Caps Take 1,200 mg by mouth 3 (three) times daily.   levothyroxine 175 MCG tablet Commonly known as: SYNTHROID Take 175 mcg by mouth daily before breakfast.   losartan-hydrochlorothiazide 50-12.5 MG tablet Commonly known as: HYZAAR Take 1 tablet by mouth daily.   melatonin 5 MG Tabs Take 5 mg by mouth at  bedtime.   nebivolol 10 MG tablet Commonly known as: BYSTOLIC Take 10 mg by mouth daily.   PRESERVISION AREDS PO Take by mouth 2 (two) times daily.   spironolactone 25 MG tablet Commonly known as: ALDACTONE Take 25 mg by mouth daily.   Tiadylt ER 180 MG 24 hr capsule Generic drug: diltiazem Take 180 mg by mouth daily.   vitamin C 500 MG tablet Commonly known as: ASCORBIC ACID Take 500 mg by mouth daily.       Allergies:  Allergies  Allergen Reactions   Bee Venom Swelling   Penicillins Swelling    Patient reports he swells and it was "something in the pcn"    Amoxicillin Swelling    Family History: No family  history on file.  Social History:  reports that he quit smoking about 41 years ago. He has never used smokeless tobacco. He reports that he does not drink alcohol and does not use drugs.   Physical Exam: BP 120/78    Pulse 96    Ht 5\' 7"  (1.702 m)    Wt 225 lb (102.1 kg)    BMI 35.24 kg/m   Constitutional:  Alert and oriented, No acute distress. HEENT: [place] AT, moist mucus membranes.  Trachea midline, no masses. Cardiovascular: No clubbing, cyanosis, or edema. Respiratory: Normal respiratory effort, no increased work of breathing. GI: Abdomen is soft, nontender, nondistended, no abdominal masses GU: Phallus without lesions, meatus normal in appearance.  Testes descended bilaterally without masses or tenderness.  Prostate 60 g, smooth without nodules Skin: No rashes, bruises or suspicious lesions. Neurologic: Grossly intact, no focal deficits, moving all 4 extremities. Psychiatric: Normal mood and affect.  Laboratory Data:  Urinalysis Dipstick/microscopy negative   Assessment & Plan:    1.  Urinary frequency, urgency, urge incontinence  PVR by bladder scan 18 mL  Trial Myrbetriq 25 mg daily  2.  Urinary hesitancy, split stream, weak stream  Severe lower urinary tract symptoms  Schedule cystoscopy to evaluate for urethral stricture/bladder neck contracture and adenoma regrowth    [name], MD  [facility] [address] [place], [place] [address] [phone]

## 2020-04-20 ENCOUNTER — Other Ambulatory Visit: Payer: Self-pay

## 2020-04-20 ENCOUNTER — Ambulatory Visit (INDEPENDENT_AMBULATORY_CARE_PROVIDER_SITE_OTHER): Payer: MEDICARE | Admitting: Internal Medicine

## 2020-04-20 VITALS — BP 125/73 | HR 82

## 2020-04-20 DIAGNOSIS — Z950 Presence of cardiac pacemaker: Secondary | ICD-10-CM

## 2020-04-20 DIAGNOSIS — J301 Allergic rhinitis due to pollen: Secondary | ICD-10-CM

## 2020-04-20 DIAGNOSIS — I495 Sick sinus syndrome: Secondary | ICD-10-CM

## 2020-04-20 DIAGNOSIS — I10 Essential (primary) hypertension: Secondary | ICD-10-CM

## 2020-04-20 LAB — URINALYSIS, COMPLETE
Bilirubin, UA: NEGATIVE
Glucose, UA: NEGATIVE
Ketones, UA: NEGATIVE
Leukocytes,UA: NEGATIVE
Nitrite, UA: NEGATIVE
Protein,UA: NEGATIVE
RBC, UA: NEGATIVE
Specific Gravity, UA: 1.015 (ref 1.005–1.030)
Urobilinogen, Ur: 0.2 mg/dL (ref 0.2–1.0)
pH, UA: 7 (ref 5.0–7.5)

## 2020-04-20 LAB — MICROSCOPIC EXAMINATION
Bacteria, UA: NONE SEEN
Epithelial Cells (non renal): NONE SEEN /hpf (ref 0–10)

## 2020-04-20 NOTE — Progress Notes (Signed)
Established Patient Office Visit  SUBJECTIVE:  Subjective  Patient ID: Samuel Mack, male    DOB: 01/04/1930  Age: 86 y.o. MRN: [id number]  CC:  Chief Complaint  Patient presents with  . Pacemaker Check    HPI Samuel Mack is a 86 y.o. male presenting today for a pacemaker check.  Patient pacemaker was interrogated by pacemakers analyzer, battery status is okay.  No programming changes were indicated after the review of the data.  Histogram shows no change since the last interrogation Atrial and ventricular sensing thresholds were found to be acceptable Impedance was checked and it was found to be normal.  Thresholds were found to be okay on evaluation of rhythm problem.  No high rate or low rate arrhythmia were noted.  Estimated battery longevity is 10.4 years. I have personally reviewed the device data and amended the report as necessary.   This is his 5th pacemaker; they were all put into place by Dr. Lavera Guise.   He notes that he has started to become unsteady on his feet and he has started to have to walk with a cane.    Past Medical History:  Diagnosis Date  . Asthma   . Cancer ([place])    skin  . Complication of anesthesia    previous pacemaker patient could feel everything  . Diabetes mellitus without complication (H[facility])    diet controlled  . Dyspnea   . Dysrhythmia   . Elevated lipids   . Hypertension   . Hypothyroidism   . Myocardial infarction ([place])   . Presence of permanent cardiac pacemaker     Past Surgical History:  Procedure Laterality Date  . CATARACT EXTRACTION W/PHACO Left 12/11/2016   Procedure: CATARACT EXTRACTION PHACO AND INTRAOCULAR LENS PLACEMENT ([facility]);  Surgeon: Leandrew Koyanagi, MD;  Location: [facility];  Service: Ophthalmology;  Laterality: Left;  IVA TOPICAL LEFT  . EYE SURGERY Left    cataract  . NASAL SINUS SURGERY    . PACEMAKER GENERATOR CHANGE Left    x2  . PACEMAKER INSERTION    . PACEMAKER INSERTION Left 05/12/2019    Procedure: INSERTION PACEMAKER, LEFT;  Surgeon: Cletis Athens, MD;  Location: [facility];  Service: Cardiovascular;  Laterality: Left;  . PROSTATE SURGERY      History reviewed. No pertinent family history.  Social History   Socioeconomic History  . Marital status: Widowed    Spouse name: Not on file  . Number of children: Not on file  . Years of education: Not on file  . Highest education level: Not on file  Occupational History  . Not on file  Tobacco Use  . Smoking status: Former Smoker    Quit date: 01/26/1979    Years since quitting: 41.2  . Smokeless tobacco: Never Used  Vaping Use  . Vaping Use: Never used  Substance and Sexual Activity  . Alcohol use: No  . Drug use: No  . Sexual activity: Not on file  Other Topics Concern  . Not on file  Social History Narrative  . Not on file   Social Determinants of Health   Financial Resource Strain:   . Difficulty of Paying Living Expenses:   Food Insecurity:   . Worried About Charity fundraiser in the Last Year:   . Arboriculturist in the Last Year:   Transportation Needs:   . Film/video editor (Medical):   Marland Kitchen Lack of Transportation (Non-Medical):   Physical Activity:   .  Days of Exercise per Week:   . Minutes of Exercise per Session:   Stress:   . Feeling of Stress :   Social Connections:   . Frequency of Communication with Friends and Family:   . Frequency of Social Gatherings with Friends and Family:   . Attends Religious Services:   . Active Member of Clubs or Organizations:   . Attends Archivist Meetings:   Marland Kitchen Marital Status:   Intimate Partner Violence:   . Fear of Current or Ex-Partner:   . Emotionally Abused:   Marland Kitchen Physically Abused:   . Sexually Abused:      Current Outpatient Medications:  .  acetaminophen (TYLENOL 8 HOUR) 650 MG CR tablet, Take 1 tablet (650 mg total) by mouth every 8 (eight) hours as needed for pain., Disp: 2 tablet, Rfl: 1 .  aspirin 81 MG tablet, Take 81 mg by mouth  daily., Disp: , Rfl:  .  atorvastatin (LIPITOR) 10 MG tablet, Take 10 mg by mouth at bedtime., Disp: , Rfl:  .  co-enzyme Q-10 30 MG capsule, Take by mouth daily. , Disp: , Rfl:  .  digoxin (LANOXIN) 0.125 MG tablet, Take 1 tablet (125 mcg total) by mouth daily., Disp: 90 tablet, Rfl: 3 .  levothyroxine (SYNTHROID) 175 MCG tablet, Take 175 mcg by mouth daily before breakfast., Disp: , Rfl:  .  losartan-hydrochlorothiazide (HYZAAR) 50-12.5 MG per tablet, Take 1 tablet by mouth daily., Disp: , Rfl:  .  Melatonin 5 MG TABS, Take 5 mg by mouth at bedtime., Disp: , Rfl:  .  Multiple Vitamins-Minerals (PRESERVISION AREDS PO), Take by mouth 2 (two) times daily., Disp: , Rfl:  .  nebivolol (BYSTOLIC) 10 MG tablet, Take 10 mg by mouth daily., Disp: , Rfl:  .  Omega-3 Fatty Acids (FISH OIL) 1200 MG CAPS, Take 1,200 mg by mouth 3 (three) times daily., Disp: , Rfl:  .  spironolactone (ALDACTONE) 25 MG tablet, Take 25 mg by mouth daily., Disp: , Rfl:  .  TIADYLT ER 180 MG 24 hr capsule, Take 180 mg by mouth daily., Disp: , Rfl:  .  vitamin C (ASCORBIC ACID) 500 MG tablet, Take 500 mg by mouth daily., Disp: , Rfl:    Allergies  Allergen Reactions  . Bee Venom Swelling  . Penicillins Swelling    Patient reports he swells and it was "something in the pcn"   . Amoxicillin Swelling    ROS Review of Systems  Constitutional: Negative.        Ambulating with a cane  HENT: Negative.   Eyes: Negative.   Respiratory: Negative.   Cardiovascular: Negative.   Gastrointestinal: Negative.   Endocrine: Negative.   Genitourinary: Negative.   Musculoskeletal: Negative.   Skin: Negative.   Allergic/Immunologic: Negative.   Neurological: Positive for weakness (legs, mild).  Hematological: Negative.   Psychiatric/Behavioral: Negative.   All other systems reviewed and are negative.    OBJECTIVE:    Physical Exam Vitals reviewed.  Constitutional:      Appearance: Normal appearance.     Comments:  Ambulating with a cane  HENT:     Right Ear: Decreased hearing noted.     Left Ear: Decreased hearing noted.     Mouth/Throat:     Mouth: Mucous membranes are moist.  Eyes:     Pupils: Pupils are equal, round, and reactive to light.  Neck:     Vascular: No carotid bruit.  Cardiovascular:     Rate and Rhythm:  Normal rate and regular rhythm.     Pulses: Normal pulses.     Heart sounds: Normal heart sounds.  Pulmonary:     Effort: Pulmonary effort is normal.     Breath sounds: Normal breath sounds.  Abdominal:     General: Bowel sounds are normal.     Palpations: Abdomen is soft. There is no hepatomegaly, splenomegaly or mass.     Tenderness: There is no abdominal tenderness.     Hernia: No hernia is present.  Musculoskeletal:     Cervical back: Neck supple.     Right lower leg: No edema.     Left lower leg: No edema.  Skin:    Findings: No rash.  Neurological:     Mental Status: He is alert and oriented to person, place, and time.     Motor: No weakness.  Psychiatric:        Mood and Affect: Mood normal.        Behavior: Behavior normal.     BP 125/73   Pulse 82  Wt Readings from Last 3 Encounters:  04/18/20 225 lb (102.1 kg)  05/12/19 220 lb 7.4 oz (100 kg)  05/07/19 220 lb (99.8 kg)    Health Maintenance Due  Topic Date Due  . COVID-19 Vaccine (1) Never done  . TETANUS/TDAP  Never done  . PNA vac Low Risk Adult (1 of 2 - PCV13) Never done  . INFLUENZA VACCINE  04/16/2020    There are no preventive care reminders to display for this patient.  CBC Latest Ref Rng & Units 05/10/2019 05/07/2019 01/26/2015  WBC 4.0 - 10.5 K/uL 10.7(H) 12.0(H) 10.2  Hemoglobin 13.0 - 17.0 g/dL 12.8(L) 13.2 14.9  Hematocrit 39 - 52 % 39.5 41.0 45.1  Platelets 150 - 400 K/uL 186 198 207   CMP Latest Ref Rng & Units 05/10/2019 05/07/2019 01/26/2015  Glucose 70 - 99 mg/dL 132(H) 78 121(H)  BUN 8 - 23 mg/dL 34(H) 28(H) 20  Creatinine 0.61 - 1.24 mg/dL 1.78(H) 1.52(H) 1.55(H)  Sodium 135  - 145 mmol/L 135 135 139  Potassium 3.5 - 5.1 mmol/L 4.0 3.8 4.4  Chloride 98 - 111 mmol/L 106 105 107  CO2 22 - 32 mmol/L 20(L) 24 24  Calcium 8.9 - 10.3 mg/dL 9.2 9.4 9.1  Total Protein 6.5 - 8.1 g/dL 6.9 - -  Total Bilirubin 0.3 - 1.2 mg/dL 0.7 - -  Alkaline Phos 38 - 126 U/L 69 - -  AST 15 - 41 U/L 23 - -  ALT 0 - 44 U/L 22 - -    No results found for: TSH Lab Results  Component Value Date   ALBUMIN 3.8 05/10/2019   ANIONGAP 9 05/10/2019   No results found for: CHOL, HDL, LDLCALC, CHOLHDL No results found for: TRIG No results found for: HGBA1C    ASSESSMENT & PLAN:   Problem List Items Addressed This Visit      Cardiovascular and Mediastinum   Sick sinus syndrome (HCC)    No atrial or ventricular arrhythmia was noted on the monitoring of the pacemaker.      Essential hypertension    Blood pressure is stable on the present medication.        Respiratory   Seasonal allergic rhinitis due to pollen    Patient was advised to take Claritin 5 mg p.o. daily        Other   Cardiac pacemaker in situ - Primary    Pacemaker is functioning  well with a normal battery life.      Relevant Orders   PACEMAKER IN CLINIC CHECK (Completed)      No orders of the defined types were placed in this encounter.     Follow-up: No follow-ups on file.    Dr. Jane Canary [facility] [address] [place], [place] [address]   By signing my name below, I, [employer], attest that this documentation has been prepared under the direction and in the presence of Cletis Athens, MD. Electronically Signed: Cletis Athens, MD 04/29/20, 6:03 PM    I personally performed the services described in this documentation, which was SCRIBED in my presence. The recorded information has been reviewed and considered accurate. It has been edited as necessary during review. Cletis Athens, MD

## 2020-04-23 ENCOUNTER — Encounter: Payer: Self-pay | Admitting: Urology

## 2020-04-29 DIAGNOSIS — I10 Essential (primary) hypertension: Secondary | ICD-10-CM | POA: Insufficient documentation

## 2020-04-29 DIAGNOSIS — J301 Allergic rhinitis due to pollen: Secondary | ICD-10-CM | POA: Insufficient documentation

## 2020-04-29 NOTE — Assessment & Plan Note (Signed)
No atrial or ventricular arrhythmia was noted on the monitoring of the pacemaker.

## 2020-04-29 NOTE — Assessment & Plan Note (Signed)
Pacemaker is functioning well with a normal battery life.

## 2020-04-29 NOTE — Assessment & Plan Note (Signed)
Blood pressure is stable on the present medication 

## 2020-04-29 NOTE — Assessment & Plan Note (Signed)
Patient was advised to take Claritin 5 mg p.o. daily ?

## 2020-05-24 ENCOUNTER — Other Ambulatory Visit: Payer: Self-pay

## 2020-05-24 ENCOUNTER — Encounter: Payer: Self-pay | Admitting: Urology

## 2020-05-24 ENCOUNTER — Other Ambulatory Visit: Payer: Self-pay | Admitting: Radiology

## 2020-05-24 ENCOUNTER — Encounter: Payer: Self-pay | Admitting: Radiology

## 2020-05-24 ENCOUNTER — Ambulatory Visit (INDEPENDENT_AMBULATORY_CARE_PROVIDER_SITE_OTHER): Payer: MEDICARE | Admitting: Urology

## 2020-05-24 VITALS — BP 143/87 | HR 98 | Ht 70.0 in | Wt 225.0 lb

## 2020-05-24 DIAGNOSIS — N320 Bladder-neck obstruction: Secondary | ICD-10-CM | POA: Diagnosis not present

## 2020-05-24 DIAGNOSIS — R399 Unspecified symptoms and signs involving the genitourinary system: Secondary | ICD-10-CM

## 2020-05-24 NOTE — H&P (View-Only) (Signed)
   05/24/20  CC:  Chief Complaint  Patient presents with  . Cysto   Indications: Refer to office note dated 04/18/2020  HPI: No improvement in voiding symptoms with Myrbetriq.  No complaints today  Blood pressure (!) 143/87, pulse 98, height 5\' 10"  (1.778 m), weight 225 lb (102.1 kg). NED. A&Ox3.   No respiratory distress   Abd soft, NT, ND Normal phallus with bilateral descended testicles  Cystoscopy Procedure Note  Patient identification was confirmed, informed consent was obtained, and patient was prepped using Betadine solution.  Lidocaine jelly was administered per urethral meatus.     Pre-Procedure: - Inspection reveals a normal caliber urethral meatus.  Procedure: The flexible cystoscope was introduced without difficulty - No urethral strictures/lesions are present. - Open prostatic fossa  - ~ 10 FR bladder neck contracture  Post-Procedure: - Patient tolerated the procedure well  Assessment/ Plan:  Bladder neck contracture  Suprapubic prostatectomy was in 2009 and he has been symptomatic for 3 years  Findings were discussed in detail with Mr. Limbaugh and his daughter  PVR last visit was 18 mL  We discussed possibility if the bladder has been chronically distended there is a possibility he could have urinary retention secondary to bladder atony  Recommended transurethral incision bladder neck contracture.  The procedure was discussed in detail including potential risks of bleeding, infection, recurrent stricture as well as anesthetic risks.  The possibility of persistent voiding symptoms was also discussed.  He indicated all questions were answered and desires to schedule   [name], MD

## 2020-05-24 NOTE — Progress Notes (Signed)
   05/24/20  CC:  Chief Complaint  Patient presents with  . Cysto   Indications: Refer to office note dated 04/18/2020  HPI: No improvement in voiding symptoms with Myrbetriq.  No complaints today  Blood pressure (!) 143/87, pulse 98, height 5\' 10"  (1.778 m), weight 225 lb (102.1 kg). NED. A&Ox3.   No respiratory distress   Abd soft, NT, ND Normal phallus with bilateral descended testicles  Cystoscopy Procedure Note  Patient identification was confirmed, informed consent was obtained, and patient was prepped using Betadine solution.  Lidocaine jelly was administered per urethral meatus.     Pre-Procedure: - Inspection reveals a normal caliber urethral meatus.  Procedure: The flexible cystoscope was introduced without difficulty - No urethral strictures/lesions are present. - Open prostatic fossa  - ~ 10 FR bladder neck contracture  Post-Procedure: - Patient tolerated the procedure well  Assessment/ Plan:  Bladder neck contracture  Suprapubic prostatectomy was in 2009 and he has been symptomatic for 3 years  Findings were discussed in detail with Samuel Mack and his daughter  PVR last visit was 18 mL  We discussed possibility if the bladder has been chronically distended there is a possibility he could have urinary retention secondary to bladder atony  Recommended transurethral incision bladder neck contracture.  The procedure was discussed in detail including potential risks of bleeding, infection, recurrent stricture as well as anesthetic risks.  The possibility of persistent voiding symptoms was also discussed.  He indicated all questions were answered and desires to schedule   [name], MD

## 2020-05-25 ENCOUNTER — Other Ambulatory Visit: Payer: Self-pay | Admitting: Radiology

## 2020-05-25 DIAGNOSIS — N320 Bladder-neck obstruction: Secondary | ICD-10-CM

## 2020-05-25 LAB — URINALYSIS, COMPLETE
Bilirubin, UA: NEGATIVE
Glucose, UA: NEGATIVE
Ketones, UA: NEGATIVE
Leukocytes,UA: NEGATIVE
Nitrite, UA: NEGATIVE
Protein,UA: NEGATIVE
RBC, UA: NEGATIVE
Specific Gravity, UA: 1.015 (ref 1.005–1.030)
Urobilinogen, Ur: 0.2 mg/dL (ref 0.2–1.0)
pH, UA: 6 (ref 5.0–7.5)

## 2020-05-25 LAB — MICROSCOPIC EXAMINATION
Bacteria, UA: NONE SEEN
Epithelial Cells (non renal): NONE SEEN /hpf (ref 0–10)

## 2020-05-28 LAB — CULTURE, URINE COMPREHENSIVE

## 2020-06-05 ENCOUNTER — Ambulatory Visit: Payer: MEDICARE | Admitting: Internal Medicine

## 2020-06-05 ENCOUNTER — Other Ambulatory Visit: Payer: Self-pay

## 2020-06-12 ENCOUNTER — Other Ambulatory Visit: Payer: MEDICARE

## 2020-06-12 ENCOUNTER — Other Ambulatory Visit: Payer: Self-pay

## 2020-06-12 DIAGNOSIS — N320 Bladder-neck obstruction: Secondary | ICD-10-CM

## 2020-06-12 DIAGNOSIS — Z01818 Encounter for other preprocedural examination: Secondary | ICD-10-CM

## 2020-06-13 ENCOUNTER — Ambulatory Visit (INDEPENDENT_AMBULATORY_CARE_PROVIDER_SITE_OTHER): Payer: MEDICARE | Admitting: Internal Medicine

## 2020-06-13 ENCOUNTER — Encounter: Payer: Self-pay | Admitting: Internal Medicine

## 2020-06-13 VITALS — BP 134/70 | HR 87 | Ht 67.0 in | Wt 235.7 lb

## 2020-06-13 DIAGNOSIS — Z950 Presence of cardiac pacemaker: Secondary | ICD-10-CM

## 2020-06-13 DIAGNOSIS — Z01818 Encounter for other preprocedural examination: Secondary | ICD-10-CM

## 2020-06-13 DIAGNOSIS — I10 Essential (primary) hypertension: Secondary | ICD-10-CM

## 2020-06-13 DIAGNOSIS — J301 Allergic rhinitis due to pollen: Secondary | ICD-10-CM

## 2020-06-13 LAB — MICROSCOPIC EXAMINATION: WBC, UA: >30 /hpf — AB (ref 0–5)

## 2020-06-13 LAB — URINALYSIS, COMPLETE
Bilirubin, UA: NEGATIVE
Glucose, UA: NEGATIVE
Ketones, UA: NEGATIVE
Nitrite, UA: NEGATIVE
Protein,UA: NEGATIVE
Specific Gravity, UA: 1.015 (ref 1.005–1.030)
Urobilinogen, Ur: 1 mg/dL (ref 0.2–1.0)
pH, UA: 5.5 (ref 5.0–7.5)

## 2020-06-13 NOTE — Assessment & Plan Note (Signed)
Cardiac pacemaker is working well 

## 2020-06-13 NOTE — Assessment & Plan Note (Signed)
-   Today, the patient's blood pressure is well managed on losartan. - The patient will continue the current treatment regimen.  - I encouraged the patient to eat a low-sodium diet to help control blood pressure. - I encouraged the patient to live an active lifestyle and complete activities that increases heart rate to 85% target heart rate at least 5 times per week for one hour.     

## 2020-06-13 NOTE — Assessment & Plan Note (Signed)
Take claritin  5 mg po daily

## 2020-06-13 NOTE — Progress Notes (Signed)
Established Patient Office Visit  Subjective:  Patient ID: Samuel Mack, male    DOB: 1929/12/11  Age: 86 y.o. MRN: [id number]  CC:  Chief Complaint  Patient presents with  . Pre-op Exam    Patient is here for surgical clearance for transurethral incision of bladder neck    HPI  COHAN STIPES presents for clearance for bladder surgery. Dr. Bernardo Heater is going to perform it. He denies any chest pain or shortness of breath. There is no palpitation. He does not have any chest pain he does not smoke or drink alcohol. He has a cataract surgery within 5 years.  Past Medical History:  Diagnosis Date  . Asthma   . Cancer ([place])    skin  . Complication of anesthesia    previous pacemaker patient could feel everything  . Diabetes mellitus without complication (HCC)    diet controlled  . Dyspnea   . Dysrhythmia   . Elevated lipids   . Hypertension   . Hypothyroidism   . Myocardial infarction ([place])   . Presence of permanent cardiac pacemaker     Past Surgical History:  Procedure Laterality Date  . CATARACT EXTRACTION W/PHACO Left 12/11/2016   Procedure: CATARACT EXTRACTION PHACO AND INTRAOCULAR LENS PLACEMENT (IOC);  Surgeon: Leandrew Koyanagi, MD;  Location: [facility];  Service: Ophthalmology;  Laterality: Left;  IVA TOPICAL LEFT  . EYE SURGERY Left    cataract  . NASAL SINUS SURGERY    . PACEMAKER GENERATOR CHANGE Left    x2  . PACEMAKER INSERTION    . PACEMAKER INSERTION Left 05/12/2019   Procedure: INSERTION PACEMAKER, LEFT;  Surgeon: Cletis Athens, MD;  Location: [facility];  Service: Cardiovascular;  Laterality: Left;  . PROSTATE SURGERY      History reviewed. No pertinent family history.  Social History   Socioeconomic History  . Marital status: Widowed    Spouse name: Not on file  . Number of children: Not on file  . Years of education: Not on file  . Highest education level: Not on file  Occupational History  . Not on file  Tobacco Use  . Smoking  status: Former Smoker    Quit date: 01/26/1979    Years since quitting: 41.4  . Smokeless tobacco: Never Used  Vaping Use  . Vaping Use: Never used  Substance and Sexual Activity  . Alcohol use: No  . Drug use: No  . Sexual activity: Not on file  Other Topics Concern  . Not on file  Social History Narrative  . Not on file   Social Determinants of Health   Financial Resource Strain:   . Difficulty of Paying Living Expenses: Not on file  Food Insecurity:   . Worried About Charity fundraiser in the Last Year: Not on file  . Ran Out of Food in the Last Year: Not on file  Transportation Needs:   . Lack of Transportation (Medical): Not on file  . Lack of Transportation (Non-Medical): Not on file  Physical Activity:   . Days of Exercise per Week: Not on file  . Minutes of Exercise per Session: Not on file  Stress:   . Feeling of Stress : Not on file  Social Connections:   . Frequency of Communication with Friends and Family: Not on file  . Frequency of Social Gatherings with Friends and Family: Not on file  . Attends Religious Services: Not on file  . Active Member of Clubs or Organizations: Not on  file  . Attends Archivist Meetings: Not on file  . Marital Status: Not on file  Intimate Partner Violence:   . Fear of Current or Ex-Partner: Not on file  . Emotionally Abused: Not on file  . Physically Abused: Not on file  . Sexually Abused: Not on file     Current Outpatient Medications:  .  acetaminophen (TYLENOL 8 HOUR) 650 MG CR tablet, Take 1 tablet (650 mg total) by mouth every 8 (eight) hours as needed for pain., Disp: 2 tablet, Rfl: 1 .  aspirin 81 MG tablet, Take 81 mg by mouth daily., Disp: , Rfl:  .  atorvastatin (LIPITOR) 10 MG tablet, Take 10 mg by mouth at bedtime., Disp: , Rfl:  .  cholecalciferol (VITAMIN D) 25 MCG (1000 UNIT) tablet, Take 1,000 Units by mouth in the morning and at bedtime., Disp: , Rfl:  .  Coenzyme Q10 100 MG TABS, Take 100 mg by  mouth daily. , Disp: , Rfl:  .  digoxin (LANOXIN) 0.125 MG tablet, Take 1 tablet (125 mcg total) by mouth daily. (Patient taking differently: Take 0.125 mg by mouth daily. ), Disp: 90 tablet, Rfl: 3 .  levocetirizine (XYZAL) 5 MG tablet, Take 5 mg by mouth every evening., Disp: , Rfl:  .  levothyroxine (SYNTHROID) 175 MCG tablet, Take 175 mcg by mouth daily before breakfast. , Disp: , Rfl:  .  losartan (COZAAR) 100 MG tablet, Take 100 mg by mouth daily., Disp: , Rfl:  .  Multiple Vitamins-Minerals (PRESERVISION AREDS PO), Take by mouth 2 (two) times daily., Disp: , Rfl:  .  nebivolol (BYSTOLIC) 10 MG tablet, Take 10 mg by mouth daily., Disp: , Rfl:  .  Omega-3 Fatty Acids (FISH OIL) 1200 MG CAPS, Take 1,200 mg by mouth 3 (three) times daily., Disp: , Rfl:  .  potassium gluconate 595 (99 K) MG TABS tablet, Take 595 mg by mouth daily., Disp: , Rfl:  .  spironolactone (ALDACTONE) 25 MG tablet, Take 25 mg by mouth daily., Disp: , Rfl:  .  TIADYLT ER 180 MG 24 hr capsule, Take 180 mg by mouth daily., Disp: , Rfl:  .  vitamin B-12 (CYANOCOBALAMIN) 1000 MCG tablet, Take 1,000 mcg by mouth daily., Disp: , Rfl:  .  vitamin C (ASCORBIC ACID) 500 MG tablet, Take 500 mg by mouth daily., Disp: , Rfl:  .  vitamin E (VITAMIN E) 180 MG (400 UNITS) capsule, Take 400 Units by mouth daily., Disp: , Rfl:    Allergies  Allergen Reactions  . Bee Venom Swelling  . Penicillins Swelling    Patient reports he swells and it was "something in the pcn" 50 year ago    ROS Review of Systems  Constitutional: Negative for appetite change, chills, diaphoresis, fatigue and fever.  HENT: Positive for hearing loss. Negative for congestion.   Eyes: Negative for pain.  Respiratory: Negative for chest tightness.   Cardiovascular: Negative for chest pain, palpitations and leg swelling.  Gastrointestinal: Negative for blood in stool.  Endocrine: Negative for polydipsia.  Genitourinary: Positive for urgency. Negative for flank  pain and hematuria.  Musculoskeletal: Positive for back pain.  Neurological: Negative for dizziness and headaches.  Psychiatric/Behavioral: Negative for dysphoric mood.      Objective:    Physical Exam Constitutional:      Appearance: He is obese.  HENT:     Head: Normocephalic.     Nose: Nose normal.  Eyes:     Extraocular Movements: Extraocular movements  intact.  Cardiovascular:     Rate and Rhythm: Normal rate and regular rhythm.  Pulmonary:     Effort: No respiratory distress.     Breath sounds: No wheezing, rhonchi or rales.  Abdominal:     Palpations: There is no mass.  Musculoskeletal:        General: No tenderness.     Cervical back: Neck supple.  Neurological:     Mental Status: He is alert.     BP 134/70   Pulse 87   Ht 5\' 7"  (1.702 m)   Wt 235 lb 11.2 oz (106.9 kg)   BMI 36.92 kg/m  Wt Readings from Last 3 Encounters:  06/13/20 235 lb 11.2 oz (106.9 kg)  05/24/20 225 lb (102.1 kg)  04/18/20 225 lb (102.1 kg)     Health Maintenance Due  Topic Date Due  . COVID-19 Vaccine (1) Never done  . TETANUS/TDAP  Never done  . PNA vac Low Risk Adult (1 of 2 - PCV13) Never done  . INFLUENZA VACCINE  04/16/2020    There are no preventive care reminders to display for this patient.  No results found for: TSH Lab Results  Component Value Date   WBC 10.7 (H) 05/10/2019   HGB 12.8 (L) 05/10/2019   HCT 39.5 05/10/2019   MCV 92.3 05/10/2019   PLT 186 05/10/2019   Lab Results  Component Value Date   NA 135 05/10/2019   K 4.0 05/10/2019   CO2 20 (L) 05/10/2019   GLUCOSE 132 (H) 05/10/2019   BUN 34 (H) 05/10/2019   CREATININE 1.78 (H) 05/10/2019   BILITOT 0.7 05/10/2019   ALKPHOS 69 05/10/2019   AST 23 05/10/2019   ALT 22 05/10/2019   PROT 6.9 05/10/2019   ALBUMIN 3.8 05/10/2019   CALCIUM 9.2 05/10/2019   ANIONGAP 9 05/10/2019   No results found for: CHOL No results found for: HDL No results found for: LDLCALC No results found for: TRIG No  results found for: CHOLHDL No results found for: HGBA1C    Assessment & Plan:   Problem List Items Addressed This Visit      Cardiovascular and Mediastinum   Essential hypertension    - Today, the patient's blood pressure is well managed on losartan. - The patient will continue the current treatment regimen.  - I encouraged the patient to eat a low-sodium diet to help control blood pressure. - I encouraged the patient to live an active lifestyle and complete activities that increases heart rate to 85% target heart rate at least 5 times per week for one hour.            Respiratory   Seasonal allergic rhinitis due to pollen    Take claritin  5 mg po daily        Other   Cardiac pacemaker in situ    Cardiac pacemaker is working well.      Pre-op evaluation - Primary    Patient denies any chest pain or shortness of breath, there is no palpitation.  His EKG shows pacemaker functioning well.  No acute changes were noted.  Blood pressure is stable.  He is approved for the bladder surgery to be done by Dr. Bernardo Heater.      Relevant Orders   EKG 12-Lead      No orders of the defined types were placed in this encounter.  Electrocardiogram reveals  pacemaker to be working well.  No acute changes are noted Follow-up: No follow-ups on file.  Cletis Athens, MD

## 2020-06-13 NOTE — Assessment & Plan Note (Signed)
Patient denies any chest pain or shortness of breath, there is no palpitation.  His EKG shows pacemaker functioning well.  No acute changes were noted.  Blood pressure is stable.  He is approved for the bladder surgery to be done by Dr. Bernardo Heater.

## 2020-06-14 ENCOUNTER — Other Ambulatory Visit: Payer: Self-pay

## 2020-06-14 ENCOUNTER — Other Ambulatory Visit
Admission: RE | Admit: 2020-06-14 | Discharge: 2020-06-14 | Disposition: A | Discharge status: Home / Self Care | Payer: MEDICARE | Source: Ambulatory Visit | Attending: Urology | Admitting: Urology

## 2020-06-14 NOTE — Patient Instructions (Addendum)
Your procedure is scheduled on: Tuesday, Oct. 5 Report to Day Surgery on the 2nd floor of the [facility]. To find out your arrival time, please call [phone] between 1PM - 3PM on: Monday, Oct. 4  REMEMBER: Instructions that are not followed completely may result in serious medical risk, up to and including death; or upon the discretion of your surgeon and anesthesiologist your surgery may need to be rescheduled.  Do not eat food after midnight the night before surgery.  No gum chewing, lozengers or hard candies.  You may however, drink clear liquids up to 2 hours before you are scheduled to arrive for your surgery. Do not drink anything within 2 hours of your scheduled arrival time.  Clear liquids include: - water  - apple juice without pulp - gatorade (not RED) - black coffee or tea (Do NOT add milk or creamers to the coffee or tea) Do NOT drink anything that is not on this list.  TAKE THESE MEDICATIONS THE MORNING OF SURGERY WITH A SIP OF WATER:  1.  Digoxin 2.  Levothyroxine 3.  nebivolol (Bystolic) 4.  Diltiazem  Follow recommendations from Cardiologist, Pulmonologist or PCP regarding stopping Aspirin.  One week prior to surgery: Stop Anti-inflammatories (NSAIDS) such as Advil, Aleve, Ibuprofen, Motrin, Naproxen, Naprosyn and Aspirin based products such as Excedrin, Goodys Powder, BC Powder. Stop ANY OVER THE COUNTER supplements until after surgery. (coenzyme Q10, preservision areds, fish oil, vitamin C, vitamin E) (You may continue taking Tylenol, Vitamin D, Vitamin B.)  On the morning of surgery brush your teeth with toothpaste and water, you may rinse your mouth with mouthwash if you wish. Do not swallow any toothpaste or mouthwash.  Do not wear jewelry.  Do not wear lotions, powders, or perfumes.   Hearing aids and dentures may not be worn into surgery.  Do not bring valuables to the hospital. [facility] is not responsible for any missing/lost belongings or  valuables.   Notify your doctor if there is any change in your medical condition (cold, fever, infection).  Wear comfortable clothing (specific to your surgery type) to the hospital.  After surgery, you can help prevent lung complications by doing breathing exercises.  Take deep breaths and cough every 1-2 hours. Your doctor may order a device called an Incentive Spirometer to help you take deep breaths.  If you are being discharged the day of surgery, you will not be allowed to drive home. You will need a responsible adult (18 years or older) to drive you home and stay with you that night.   If you are taking public transportation, you will need to have a responsible adult (18 years or older) with you. Please confirm with your physician that it is acceptable to use public transportation.   Please call the [facility] Dept. at [phone] if you have any questions about these instructions.  Visitation Policy:  Patients undergoing a surgery or procedure may have one family member or support person with them as long as that person is not COVID-19 positive or experiencing its symptoms.  That person may remain in the waiting area during the procedure.  Masking is required regardless of vaccination status.

## 2020-06-15 NOTE — Progress Notes (Addendum)
°  [facility] Perioperative Services: Pre-Admission/Anesthesia Testing   Date: 06/15/20  Name: Samuel Mack MRN:   [id number]  Re: Consideration of perioperative therapeutic ABX change in patient with PCN allergy  Request sent to: [name], MD Notification mode: Routed and/or faxed via [facility]   Procedure: Transurethral incision of bladder neck contracture Date of procedure: 06/20/2020  Notes: 1. Patient has a documented allergy to PCN.   Advising that PCN has caused him to experience swelling in the past.  2. Received cephalosporin (CEFAZOLIN) on 05/12/2019 with no documented complications. 3. Screened as appropriate for cephalosporin use during medication reconciliation  No immediate angioedema, dysphagia, SOB, or anaphylaxis symptoms.  No severe rash involving mucous membranes or skin necrosis.  No hospital admissions related to side effects of PCN/cephalosporin use.   No documented reaction to PCN or cephalosporin in the last 10 years.  Currently ordered preoperative prophylactic ABX: ciprofloxacin.   Request: As an evidence based approach to reducing the rate of incidence for post-operative SSI and the development of MDROs, could an agent with narrower coverage for preoperative prophylaxis in this patient's upcoming surgical course be considered?  1. Specifically requesting change to cephalosporin (CEFAZOLIN).  2. Please have your staff communicate decision with me and I would be happy to change the orders in Epic as per your direction.   Things to consider:  Many patients report that they were "allergic" to PCN earlier in life, however this does not translate into a true lifelong allergy. Patients can lose sensitivity to specific IgE antibodies over time if PCN is avoided (Kleris & Lugar, 2019).   Up to 10% of the adult population and 15% of hospitalized patients report an allergy to PCN, however clinical studies suggest that 90% of those  reporting an allergy can tolerate PCN antibiotics (Kleris & Lugar, 2019).   Cross-sensitivity between PCN and cephalosporins has been documented as being as high as 10%, however this estimation included data believed to have been collected in a setting where there was contamination. Newer data suggests that the prevalence of cross-sensitivity between PCN and cephalosporins is actually estimated to be closer to 1% (Hermanides et al., 2018).    Patients labeled as PCN allergic, whether they are truly allergic or not, have been found to have inferior outcomes in terms of rates of serious infection, and these patients tend to have longer hospital stays (Norton, 2019).   Treatment related secondary infections, such as Clostridioides difficile, have been linked to the improper use of broad spectrum antibiotics in patients improperly labeled as PCN allergic (Kleris & Lugar, 2019).   Anaphylaxis from cephalosporins is rare and the evidence suggests that there is no increased risk of an anaphylactic type reaction when cephalosporins are used in a PCN allergic patient (Pichichero, 2006).   Citations Hermanides J, Lemkes BA, Prins JM, Hollmann MW, Terreehorst I. Presumed ?-Lactam Allergy and Cross-reactivity in the Operating Theater: A Practical Approach. Anesthesiology. 2018 Aug;129(2):335-342. doi: 10.1097/ALN.0000000000002252. PMID: 29937169.  Kleris, Clarinda., & Lugar, P. L. (2019). Things We Do For No Reason: Failing to Question a Penicillin Allergy History. Journal of hospital medicine, 14(10), [phone]. Advance online publication. https://www.wallace-middleton.info/  Pichichero, M. E. (2006). Cephalosporins can be prescribed safely for penicillin-allergic patients. Journal of family medicine, 55(2), 106-112. Accessed: https://cdn.mdedge.com/files/s34fs-public/Document/September-2017/5502JFP_AppliedEvidence1.pdf  Honor Loh, MSN, APRN, FNP-C, CEN [facility]  Peri-operative  Services Nurse Practitioner Phone: [phone] 06/15/20 4:21 PM

## 2020-06-16 ENCOUNTER — Other Ambulatory Visit: Payer: Self-pay

## 2020-06-16 ENCOUNTER — Telehealth: Payer: Self-pay | Admitting: *Deleted

## 2020-06-16 ENCOUNTER — Other Ambulatory Visit
Admission: RE | Admit: 2020-06-16 | Discharge: 2020-06-16 | Disposition: A | Discharge status: Home / Self Care | Payer: MEDICARE | Source: Ambulatory Visit | Attending: Urology | Admitting: Urology

## 2020-06-16 ENCOUNTER — Other Ambulatory Visit: Payer: Self-pay | Admitting: *Deleted

## 2020-06-16 DIAGNOSIS — Z01812 Encounter for preprocedural laboratory examination: Secondary | ICD-10-CM | POA: Diagnosis present

## 2020-06-16 DIAGNOSIS — Z20822 Contact with and (suspected) exposure to covid-19: Secondary | ICD-10-CM | POA: Insufficient documentation

## 2020-06-16 LAB — CBC
HCT: 39.7 % (ref 39.0–52.0)
Hemoglobin: 13.3 g/dL (ref 13.0–17.0)
MCH: 30.9 pg (ref 26.0–34.0)
MCHC: 33.5 g/dL (ref 30.0–36.0)
MCV: 92.1 fL (ref 80.0–100.0)
Platelets: 204 10*3/uL (ref 150–400)
RBC: 4.31 MIL/uL (ref 4.22–5.81)
RDW: 13.5 % (ref 11.5–15.5)
WBC: 13.6 10*3/uL — ABNORMAL HIGH (ref 4.0–10.5)
nRBC: 0 % (ref 0.0–0.2)

## 2020-06-16 LAB — BASIC METABOLIC PANEL
Anion gap: 10 (ref 5–15)
BUN: 23 mg/dL (ref 8–23)
CO2: 22 mmol/L (ref 22–32)
Calcium: 9.3 mg/dL (ref 8.9–10.3)
Chloride: 100 mmol/L (ref 98–111)
Creatinine, Ser: 1.51 mg/dL — ABNORMAL HIGH (ref 0.61–1.24)
GFR calc Af Amer: 47 mL/min — ABNORMAL LOW (ref >60)
GFR calc non Af Amer: 41 mL/min — ABNORMAL LOW (ref >60)
Glucose, Bld: 100 mg/dL — ABNORMAL HIGH (ref 70–99)
Potassium: 4.3 mmol/L (ref 3.5–5.1)
Sodium: 132 mmol/L — ABNORMAL LOW (ref 135–145)

## 2020-06-16 LAB — CULTURE, URINE COMPREHENSIVE

## 2020-06-16 IMAGING — CR CHEST - 2 VIEW
1 series · 2 of 2 positions shown · non-contrast
Comparison: 09/03/2017

CLINICAL DATA: Preoperative evaluation for pacemaker placement,
battery end of life, former smoker, hypertension, diabetes mellitus,
asthma

EXAM:
CHEST - 2 VIEW

[Series 1: dg chest 2 view · 0.14mm/px · 2 of 2 slices shown]
[im 1/2]
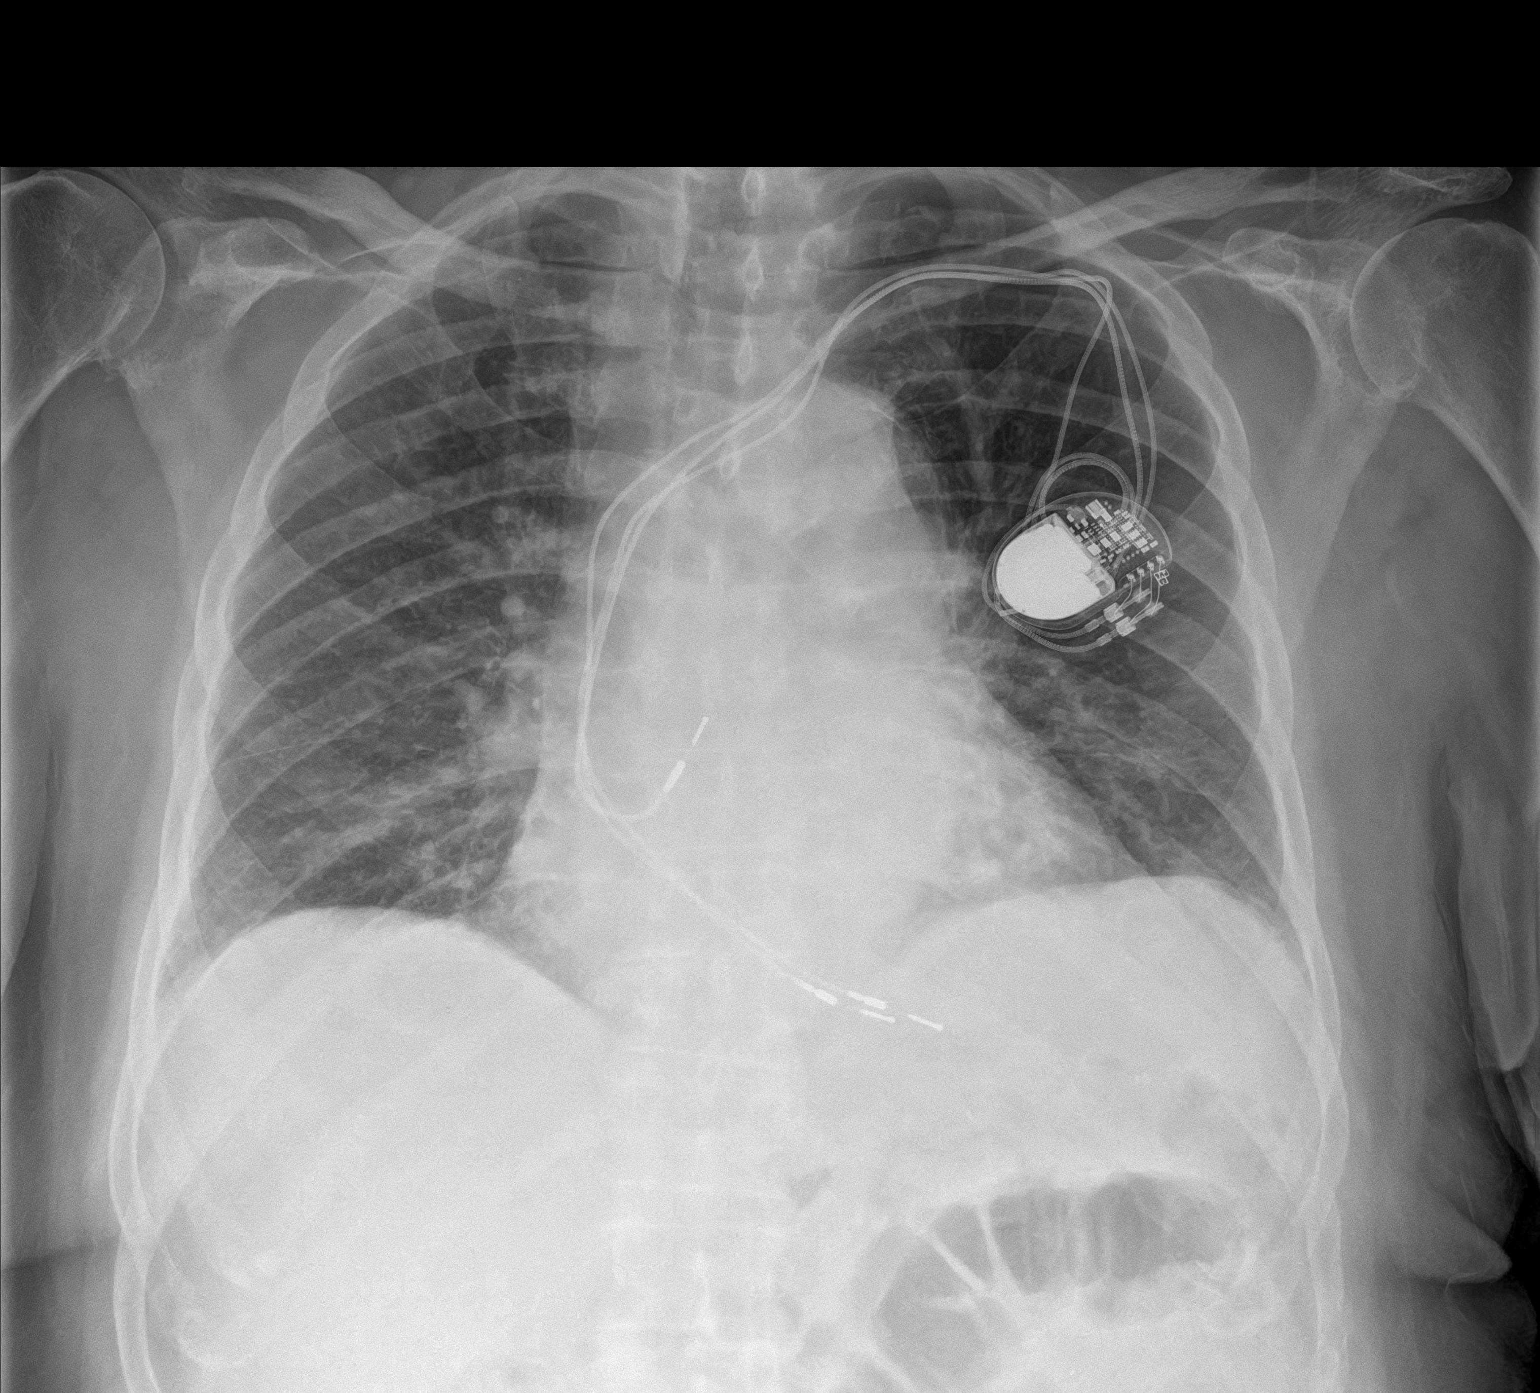
[im 2/2]
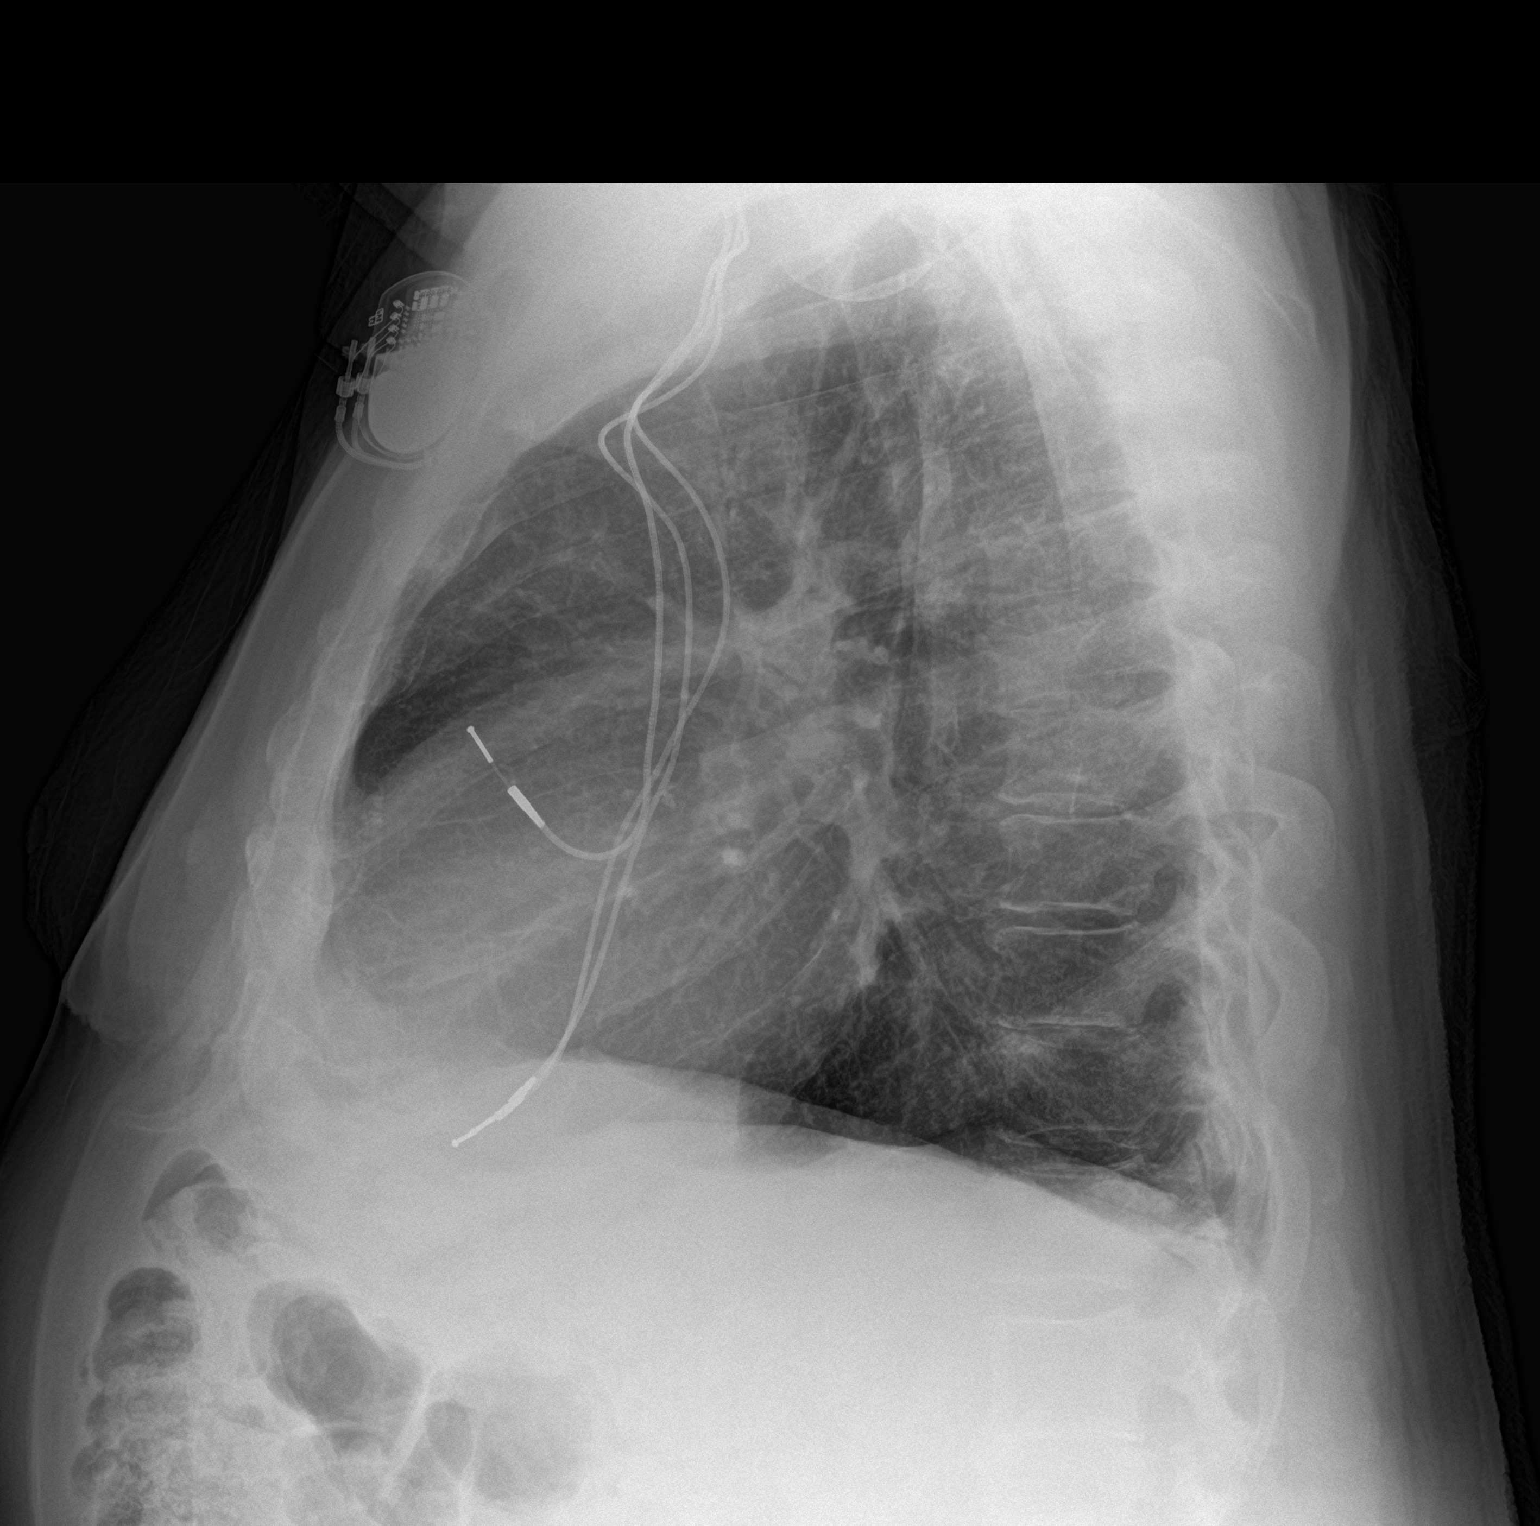

[2 of 2 positions shown; findings below may reference images not displayed]

FINDINGS: Left subclavian sequential transvenous pacemaker leads project at
right atrium and right ventricle.

Enlargement of cardiac silhouette with pulmonary vascular
congestion.

Calcified tortuous thoracic aorta.

Bibasilar atelectasis and mild central bronchitic changes.

No infiltrate, pleural effusion or pneumothorax.
IMPRESSION: Enlargement of cardiac silhouette with vascular congestion post
pacemaker.

Mild bronchitic changes with bibasilar atelectasis.

## 2020-06-16 MED ORDER — NITROFURANTOIN MONOHYD MACRO 100 MG PO CAPS: 100.0000 mg | ORAL_CAPSULE | Freq: Two times a day (BID) | ORAL | 0 refills | Status: DC

## 2020-06-16 NOTE — Telephone Encounter (Signed)
Notified patient as instructed, patient pleased. Discussed follow-up appointments, patient agrees  

## 2020-06-16 NOTE — Telephone Encounter (Signed)
-----   Message from Nori Riis, PA-C sent at 06/16/2020 11:49 AM EDT ----- Please let Mr. Frappier know that his urine culture grew out bacteria and we need to treat it as he is having an upcoming procedure.  We need for him to start Macrobid 100 mg twice daily x7 days

## 2020-06-17 LAB — SARS CORONAVIRUS 2 (TAT 6-24 HRS): SARS Coronavirus 2: NEGATIVE

## 2020-06-19 IMAGING — CR CHEST - 2 VIEW
1 series · 2 of 2 positions shown · non-contrast
Comparison: 05/07/2019.

CLINICAL DATA: Preoperative exam for pacemaker.

EXAM:
CHEST - 2 VIEW

[Series 1: dg chest 2 view · 0.14mm/px · 2 of 2 slices shown]
[im 1/2]
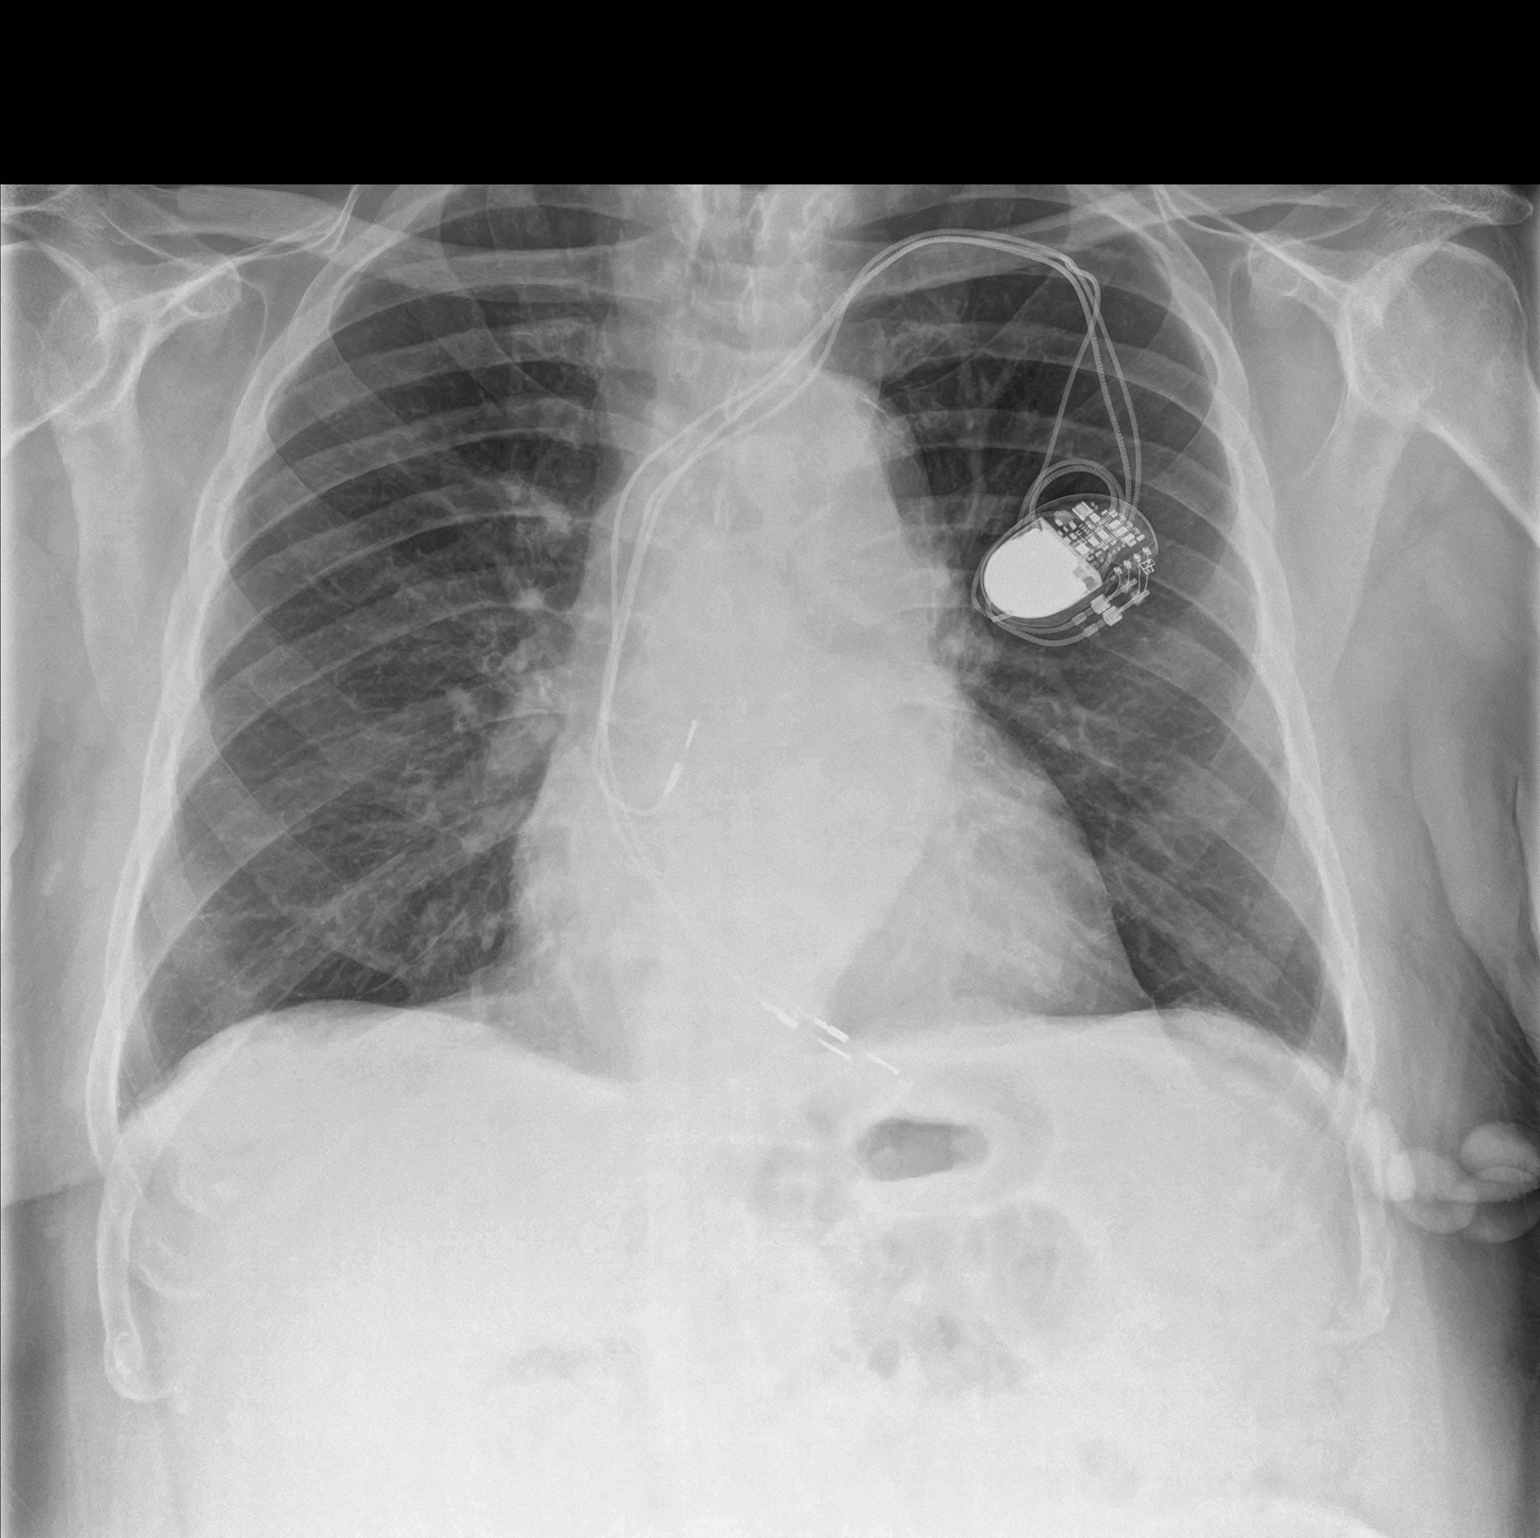
[im 2/2]
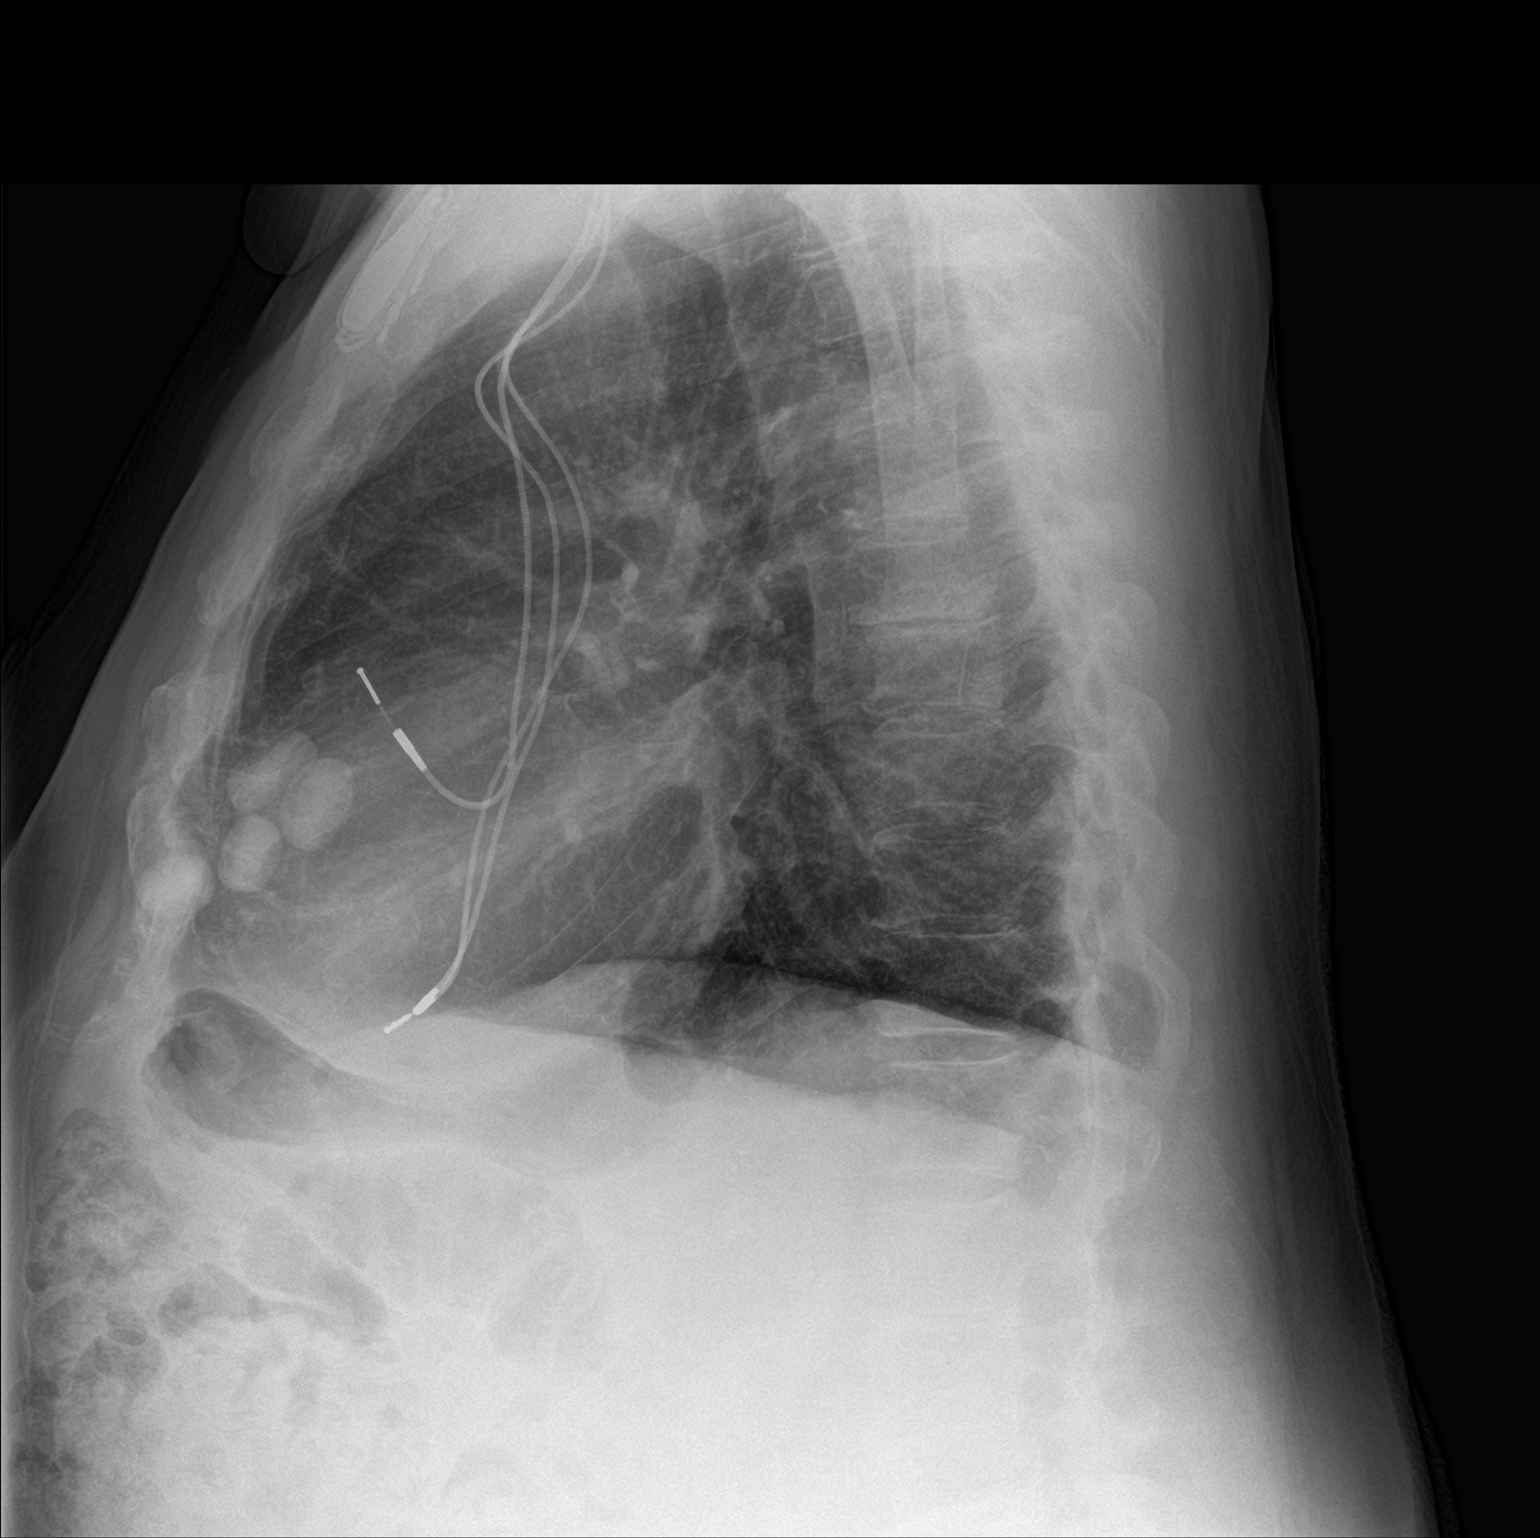

[2 of 2 positions shown; findings below may reference images not displayed]

FINDINGS: Cardiac pacer stable position. Stable cardiomegaly. Stable thoracic
aortic prominence. No focal infiltrate. No pleural effusion or
pneumothorax. Radiopacities noted over the left lateral chest most
likely outside the patient. Degenerative change thoracic spine.
IMPRESSION: 1. Cardiac pacer stable position. Stable cardiomegaly. Stable
thoracic aortic prominence.

2.  No acute infiltrate.

## 2020-06-20 ENCOUNTER — Encounter: Payer: Self-pay | Admitting: Urology

## 2020-06-20 ENCOUNTER — Ambulatory Visit: Payer: MEDICARE | Admitting: Urgent Care

## 2020-06-20 ENCOUNTER — Ambulatory Visit
Admission: RE | Admit: 2020-06-20 | Discharge: 2020-06-20 | Disposition: A | Discharge status: Home / Self Care | Payer: MEDICARE | Attending: Urology | Admitting: Urology

## 2020-06-20 ENCOUNTER — Telehealth: Payer: Self-pay | Admitting: Urology

## 2020-06-20 ENCOUNTER — Other Ambulatory Visit: Payer: Self-pay

## 2020-06-20 ENCOUNTER — Encounter
Admission: RE | Disposition: A | Discharge status: Home / Self Care | Payer: Self-pay | Source: Home / Self Care | Attending: Urology

## 2020-06-20 DIAGNOSIS — N320 Bladder-neck obstruction: Secondary | ICD-10-CM | POA: Insufficient documentation

## 2020-06-20 DIAGNOSIS — N35919 Unspecified urethral stricture, male, unspecified site: Secondary | ICD-10-CM | POA: Diagnosis not present

## 2020-06-20 DIAGNOSIS — Z9079 Acquired absence of other genital organ(s): Secondary | ICD-10-CM | POA: Diagnosis not present

## 2020-06-20 DIAGNOSIS — N400 Enlarged prostate without lower urinary tract symptoms: Secondary | ICD-10-CM

## 2020-06-20 HISTORY — PX: TRANSURETHRAL RESECTION OF PROSTATE: SHX73

## 2020-06-20 LAB — GLUCOSE, CAPILLARY
Glucose-Capillary: 103 mg/dL — ABNORMAL HIGH (ref 70–99)
Glucose-Capillary: 103 mg/dL — ABNORMAL HIGH (ref 70–99)

## 2020-06-20 SURGERY — TURP (TRANSURETHRAL RESECTION OF PROSTATE)
Anesthesia: General | Site: Prostate

## 2020-06-20 MED ORDER — ONDANSETRON HCL 4 MG/2ML IJ SOLN
INTRAMUSCULAR | Status: DC | PRN
  Administered 2020-06-20: 4 mg via INTRAVENOUS

## 2020-06-20 MED ORDER — CIPROFLOXACIN IN D5W 400 MG/200ML IV SOLN
400.0000 mg | INTRAVENOUS | Status: AC
  Administered 2020-06-20: 400 mg via INTRAVENOUS

## 2020-06-20 MED ORDER — FENTANYL CITRATE (PF) 100 MCG/2ML IJ SOLN
INTRAMUSCULAR | Status: DC | PRN
Start: 2020-06-20 — End: 2020-06-20
  Administered 2020-06-20 (×2): 50 ug via INTRAVENOUS

## 2020-06-20 MED ORDER — PHENYLEPHRINE HCL (PRESSORS) 10 MG/ML IV SOLN
INTRAVENOUS | Status: DC | PRN
  Administered 2020-06-20 (×3): 200 ug via INTRAVENOUS

## 2020-06-20 MED ORDER — LIDOCAINE HCL (CARDIAC) PF 100 MG/5ML IV SOSY
PREFILLED_SYRINGE | INTRAVENOUS | Status: DC | PRN
  Administered 2020-06-20: 40 mg via INTRAVENOUS

## 2020-06-20 MED ORDER — ORAL CARE MOUTH RINSE: 15.0000 mL | Freq: Once | OROMUCOSAL | Status: AC

## 2020-06-20 MED ORDER — ONDANSETRON HCL 4 MG/2ML IJ SOLN: 4.0000 mg | Freq: Once | INTRAMUSCULAR | Status: DC | PRN

## 2020-06-20 MED ORDER — FAMOTIDINE 20 MG PO TABS
ORAL_TABLET | ORAL | Status: AC
  Administered 2020-06-20: 20 mg via ORAL
  Filled 2020-06-20: qty 1

## 2020-06-20 MED ORDER — FAMOTIDINE 20 MG PO TABS: 20.0000 mg | ORAL_TABLET | Freq: Once | ORAL | Status: AC

## 2020-06-20 MED ORDER — CHLORHEXIDINE GLUCONATE 0.12 % MT SOLN: 15.0000 mL | Freq: Once | OROMUCOSAL | Status: AC

## 2020-06-20 MED ORDER — CHLORHEXIDINE GLUCONATE 0.12 % MT SOLN
OROMUCOSAL | Status: AC
  Administered 2020-06-20: 15 mL via OROMUCOSAL
  Filled 2020-06-20: qty 15

## 2020-06-20 MED ORDER — PROPOFOL 10 MG/ML IV BOLUS
INTRAVENOUS | Status: DC | PRN
  Administered 2020-06-20: 80 mg via INTRAVENOUS

## 2020-06-20 MED ORDER — CIPROFLOXACIN IN D5W 400 MG/200ML IV SOLN
INTRAVENOUS | Status: AC
  Filled 2020-06-20: qty 200

## 2020-06-20 MED ORDER — PROPOFOL 500 MG/50ML IV EMUL
INTRAVENOUS | Status: AC
  Filled 2020-06-20: qty 100

## 2020-06-20 MED ORDER — FENTANYL CITRATE (PF) 100 MCG/2ML IJ SOLN: 25.0000 ug | INTRAMUSCULAR | Status: DC | PRN

## 2020-06-20 MED ORDER — FENTANYL CITRATE (PF) 100 MCG/2ML IJ SOLN
INTRAMUSCULAR | Status: AC
  Filled 2020-06-20: qty 2

## 2020-06-20 MED ORDER — SODIUM CHLORIDE 0.9 % IV SOLN: INTRAVENOUS | Status: DC

## 2020-06-20 SURGICAL SUPPLY — 27 items
BAG DRAIN CYSTO-URO LG1000N (MISCELLANEOUS) ×4 IMPLANT
BAG DRN RND TRDRP ANRFLXCHMBR (UROLOGICAL SUPPLIES) ×2
BAG URINE DRAIN 2000ML AR STRL (UROLOGICAL SUPPLIES) ×4 IMPLANT
CATH FOLEY 2WAY 18X30 (CATHETERS) IMPLANT
CATH FOLEY 2WAY SIL 18X30 (CATHETERS)
DRAPE UTILITY 15X26 TOWEL STRL (DRAPES) ×4 IMPLANT
DRSG TELFA 4X3 1S NADH ST (GAUZE/BANDAGES/DRESSINGS) ×4 IMPLANT
ELECT LOOP 22F BIPOLAR SML (ELECTROSURGICAL) ×4
ELECT REM PT RETURN 9FT ADLT (ELECTROSURGICAL)
ELECTRODE LOOP 22F BIPOLAR SML (ELECTROSURGICAL) ×2 IMPLANT
ELECTRODE REM PT RTRN 9FT ADLT (ELECTROSURGICAL) IMPLANT
GLOVE BIOGEL PI IND STRL 7.5 (GLOVE) ×2 IMPLANT
GLOVE BIOGEL PI INDICATOR 7.5 (GLOVE) ×2
GOWN STRL REUS W/ TWL LRG LVL3 (GOWN DISPOSABLE) ×2 IMPLANT
GOWN STRL REUS W/TWL LRG LVL3 (GOWN DISPOSABLE) ×4
GOWN STRL REUS W/TWL XL LVL4 (GOWN DISPOSABLE) ×4 IMPLANT
IV NS IRRIG 3000ML ARTHROMATIC (IV SOLUTION) ×8 IMPLANT
KIT TURNOVER CYSTO (KITS) ×4 IMPLANT
LOOP CUT BIPOLAR 24F LRG (ELECTROSURGICAL) IMPLANT
PACK CYSTO AR (MISCELLANEOUS) ×4 IMPLANT
SENSORWIRE 0.038 NOT ANGLED (WIRE) ×4
SET IRRIG Y TYPE TUR BLADDER L (SET/KITS/TRAYS/PACK) ×4 IMPLANT
SURGILUBE 2OZ TUBE FLIPTOP (MISCELLANEOUS) ×4 IMPLANT
SYR TOOMEY IRRIG 70ML (MISCELLANEOUS) ×4
SYRINGE TOOMEY IRRIG 70ML (MISCELLANEOUS) ×2 IMPLANT
WATER STERILE IRR 1000ML POUR (IV SOLUTION) ×4 IMPLANT
WIRE SENSOR 0.038 NOT ANGLED (WIRE) ×2 IMPLANT

## 2020-06-20 NOTE — Discharge Instructions (Signed)
Transurethral Resection of the Prostate (TURP)  Care After  Refer to this sheet in the next few weeks. These discharge instructions provide you with general information on caring for yourself after you leave the hospital. Your caregiver may also give you specific instructions. Your treatment has been planned according to the most current medical practices available, but unavoidable complications sometimes occur. If you have any problems or questions after discharge, please call your caregiver.  HOME CARE INSTRUCTIONS   Medications  You may receive medicine for pain management. As your level of discomfort decreases, adjustments in your pain medicines may be made.   Take all medicines as directed.   You may be given a medicine (antibiotic) to kill germs following surgery. Finish all medicines. Let your caregiver know if you have any side effects or problems from the medicine.   If you are on aspirin, it would be best not to restart the aspirin until the blood in the urine clears Hygiene  You can take a shower after surgery.   You should not take a bath while you still have the urethral catheter. Activity  You will be encouraged to get out of bed as much as possible and increase your activity level as tolerated.   Spend the first week in and around your home. For 3 weeks, avoid the following:   Straining.   Running.   Strenuous work.   Walks longer than a few blocks.   Riding for extended periods.   Sexual relations.   Do not lift heavy objects (more than 20 pounds) for at least 1 month. When lifting, use your arms instead of your abdominal muscles.   You will be encouraged to walk as tolerated. Do not exert yourself. Increase your activity level slowly. Remember that it is important to keep moving after an operation of any type. This cuts down on the possibility of developing blood clots.   Your caregiver will tell you when you can resume driving and light housework. Discuss  this at your first office visit after discharge. Diet  No special diet is ordered after a TURP. However, if you are on a special diet for another medical problem, it should be continued.   Normal fluid intake is usually recommended.   Avoid alcohol and caffeinated drinks for 2 weeks. They irritate the bladder. Decaffeinated drinks are okay.   Avoid spicy foods.  Bladder Function  For the first 10 days, empty the bladder whenever you feel a definite desire. Do not try to hold the urine for long periods of time.   Urinating once or twice a night even after you are healed is not uncommon.   You may see some recurrence of blood in the urine after discharge from the hospital. This usually happens within 2 weeks after the procedure.If this occurs, force fluids again as you did in the hospital and reduce your activity.  Bowel Function  You may experience some constipation after surgery. This can be minimized by increasing fluids and fiber in your diet. Drink enough water and fluids to keep your urine clear or pale yellow.   A stool softener may be prescribed for use at home. Do not strain to move your bowels.   If you are requiring increased pain medicine, it is important that you take stool softeners to prevent constipation. This will help to promote proper healing by reducing the need to strain to move your bowels.  Sexual Activity  Semen movement in the opposite direction and into the  bladder (retrograde ejaculation) may occur. Since the semen passes into the bladder, cloudy urine can occur the first time you urinate after intercourse. Or, you may not have an ejaculation during erection. Ask your caregiver when you can resume sexual activity. Retrograde ejaculation and reduced semen discharge should not reduce one's pleasure of intercourse.  Postoperative Visit  Arrange the date and time of your after surgery visit with your caregiver.  Return to Work  After your recovery is complete, you  will be able to return to work and resume all activities. Your caregiver will inform you when you can return to work.    Foley Catheter Care A soft, flexible tube (Foley catheter) may have been placed in your bladder to drain urine and fluid. Follow these instructions: Taking Care of the Catheter  Keep the area where the catheter leaves your body clean.   Attach the catheter to the leg so there is no tension on the catheter.   Keep the drainage bag below the level of the bladder, but keep it OFF the floor.   Do not take long soaking baths. Your caregiver will give instructions about showering.   Wash your hands before touching ANYTHING related to the catheter or bag.   Using mild soap and warm water on a washcloth:   Clean the area closest to the catheter insertion site using a circular motion around the catheter.   Clean the catheter itself by wiping AWAY from the insertion site for several inches down the tube.   NEVER wipe upward as this could sweep bacteria up into the urethra (tube in your body that normally drains the bladder) and cause infection.   Place a small amount of sterile lubricant at the tip of the penis where the catheter is entering.  Taking Care of the Drainage Bags  Two drainage bags may be taken home: a large overnight drainage bag, and a smaller leg bag which fits underneath clothing.   It is okay to wear the overnight bag at any time, but NEVER wear the smaller leg bag at night.   Keep the drainage bag well below the level of your bladder. This prevents backflow of urine into the bladder and allows the urine to drain freely.   Anchor the tubing to your leg to prevent pulling or tension on the catheter. Use tape or a leg strap provided by the hospital.   Empty the drainage bag when it is 1/2 to 3/4 full. Wash your hands before and after touching the bag.   Periodically check the tubing for kinks to make sure there is no pressure on the tubing which could  restrict the flow of urine.  Changing the Drainage Bags  Cleanse both ends of the clean bag with alcohol before changing.   Pinch off the rubber catheter to avoid urine spillage during the disconnection.   Disconnect the dirty bag and connect the clean one.   Empty the dirty bag carefully to avoid a urine spill.   Attach the new bag to the leg with tape or a leg strap.  Cleaning the Drainage Bags  Whenever a drainage bag is disconnected, it must be cleaned quickly so it is ready for the next use.   Wash the bag in warm, soapy water.   Rinse the bag thoroughly with warm water.   Soak the bag for 30 minutes in a solution of white vinegar and water (1 cup vinegar to 1 quart warm water).   Rinse with warm water.  SEEK MEDICAL CARE IF:   You have chills or night sweats.   You are leaking around your catheter or have problems with your catheter. It is not uncommon to have sporadic leakage around your catheter as a result of bladder spasms. If the leakage stops, there is not much need for concern. If you are uncertain, call your caregiver.   You develop side effects that you think are coming from your medicines.  SEEK IMMEDIATE MEDICAL CARE IF:   You are suddenly unable to urinate. Check to see if there are any kinks in the drainage tubing that may cause this. If you cannot find any kinks, call your caregiver immediately. This is an emergency.   You develop shortness of breath or chest pains.   Bleeding persists or clots develop in your urine.   You have a fever.   You develop pain in your back or over your lower belly (abdomen).   You develop pain or swelling in your legs.   Any problems you are having get worse rather than better.  MAKE SURE YOU:   Understand these instructions.   Will watch your condition.   Will get help right away if you are not doing well or get worse.   Call our office at [phone] for any questions or concerns   AMBULATORY SURGERY    DISCHARGE INSTRUCTIONS   1) The drugs that you were given will stay in your system until tomorrow so for the next 24 hours you should not:  A) Drive an automobile B) Make any legal decisions C) Drink any alcoholic beverage   2) You may resume regular meals tomorrow.  Today it is better to start with liquids and gradually work up to solid foods.  You may eat anything you prefer, but it is better to start with liquids, then soup and crackers, and gradually work up to solid foods.   3) Please notify your doctor immediately if you have any unusual bleeding, trouble breathing, redness and pain at the surgery site, drainage, fever, or pain not relieved by medication.    4) Additional Instructions:        Please contact your physician with any problems or Same Day Surgery at [phone], Monday through Friday 6 am to 4 pm, or [place] at [facility] number at [phone].

## 2020-06-20 NOTE — Anesthesia Procedure Notes (Signed)
Procedure Name: LMA Insertion Performed by: Staci Acosta, CRNA Pre-anesthesia Checklist: Patient identified, Patient being monitored, Timeout performed, Emergency Drugs available and Suction available Patient Re-evaluated:Patient Re-evaluated prior to induction Oxygen Delivery Method: Circle system utilized Preoxygenation: Pre-oxygenation with 100% oxygen Induction Type: IV induction Ventilation: Mask ventilation without difficulty LMA: LMA inserted LMA Size: 4.0 Tube type: Oral Number of attempts: 1 Placement Confirmation: positive ETCO2 and breath sounds checked- equal and bilateral Tube secured with: Tape Dental Injury: Teeth and Oropharynx as per pre-operative assessment  Comments: Unable to get seal with 4.5 airQ

## 2020-06-20 NOTE — Progress Notes (Signed)
   06/20/20 0750  Clinical Encounter Type  Visited With Family  Visit Type Initial  Referral From Chaplain  Consult/Referral To Chaplain  While rounding [facility] waiting area, chaplain briefly spoke with Pt's daughter. She said she was fine, just happy her father was having this procedure, so he could pee again. Chaplain wished her well and left.

## 2020-06-20 NOTE — Telephone Encounter (Signed)
-----   Message from [name], MD sent at 06/20/2020  8:45 AM EDT ----- Regarding: Cath removal Please schedule PA follow-up Friday morning for catheter removal/voiding trial

## 2020-06-20 NOTE — Telephone Encounter (Signed)
App made pt is aware 

## 2020-06-20 NOTE — Transfer of Care (Signed)
Immediate Anesthesia Transfer of Care Note  Patient: Samuel Mack  Procedure(s) Performed: TRANSURETHRAL RESECTION OF THE PROSTATE (TURP) (N/A Prostate)  Patient Location: PACU  Anesthesia Type:General  Level of Consciousness: sedated  Airway & Oxygen Therapy: Patient Spontanous Breathing and Patient connected to face mask oxygen  Post-op Assessment: Report given to RN and Post -op Vital signs reviewed and stable  Post vital signs: Reviewed and stable  Last Vitals:  Vitals Value Taken Time  BP 123/95 06/20/20 0836  Temp 35.7 C 06/20/20 0836  Pulse 69 06/20/20 0842  Resp 12 06/20/20 0842  SpO2 95 % 06/20/20 0842  Vitals shown include unvalidated device data.  Last Pain:  Vitals:   06/20/20 0836  TempSrc:   PainSc: 0-No pain         Complications: No complications documented.

## 2020-06-20 NOTE — Op Note (Signed)
Preoperative diagnosis:  Bladder neck contracture  Postoperative diagnosis:  Prostatic urethral stricture  Procedure: Transurethral resection prostate  Surgeon: [name], MD  Anesthesia: General  Complications: None  Intraoperative findings:  < 10 French stricture of the prostatic urethra just proximal to the verumontanum Heavily trabeculated bladder with cellule formation.  No solid or papillary lesions  EBL: Minimal  Specimens: Prostate chips  Indication: Samuel Mack is a 86 y.o. male status post open suprapubic prostatectomy for urinary retention by Dr. Quillian Quince in July 2009.  He presented on 04/18/2020 with a several year history of urinary frequency, urgency, weak stream and variable nocturia.  IPSS 30/35.  Office cystoscopy remarkable for a <10 French bladder neck contracture.  After reviewing the management options for treatment, he elected to proceed with the above surgical procedure(s). We have discussed the potential benefits and risks of the procedure, side effects of the proposed treatment, the likelihood of the patient achieving the goals of the procedure, and any potential problems that might occur during the procedure or recuperation. Informed consent has been obtained.  Description of procedure:  The patient was taken to the operating room and general anesthesia was induced.  The patient was placed in the dorsal lithotomy position, prepped and draped in the usual sterile fashion, and preoperative antibiotics were administered. A preoperative time-out was performed.   A 24 French continuous-flow resectoscope sheath with visual obturator was lubricated and passed per urethra.  There were a wide caliber penile urethral stricture present which did not impede passage of the resectoscope.  The verumontanum was visualized and just proximal to the verumontanum a stricture was noted as described above.  A laser bridge was placed in the resectoscope and a 0.038 Sensor wire  was advanced through the stricture without difficulty.  The resectoscope was removed and the stricture was dilated with Heyman-Bard dilators to 18 [place].  The resectoscope was repassed and the prostatic fossa did not appear open.  It was elected to this point to resect the strictured area with the bipolar electrode.  Once resected enough to advance the resectoscope the bladder neck was actually normal in appearance and open.  Some residual adenoma and scar tissue was then resected to obtain an open channel.  Hemostasis was obtained with cautery.  The bladder mucosa was closely inspected with marked trabeculation.  No solid or papillary lesions were noted.  Chips were removed from the bladder with [place] syringe irrigation.  The resectoscope was positioned at the verumontanum and with the flow off no bleeding was noted.  The resectoscope was removed and an 22 French Foley catheter was placed with return of clear effluent upon irrigation.  The Foley catheter was placed to gravity drainage.      Plan: He will be discharged with an indwelling Foley catheter and follow-up Friday, 06/23/2020 for catheter removal   [name], M.D.

## 2020-06-20 NOTE — Anesthesia Preprocedure Evaluation (Signed)
Anesthesia Evaluation  Patient identified by MRN, date of birth, ID band Patient awake    Reviewed: Allergy & Precautions, NPO status , Patient's Chart, lab work & pertinent test results  History of Anesthesia Complications Negative for: history of anesthetic complications  Airway Mallampati: II  TM Distance: >3 FB Neck ROM: Full    Dental  (+) Upper Dentures, Lower Dentures   Pulmonary asthma , neg sleep apnea, former smoker,    breath sounds clear to auscultation- rhonchi (-) wheezing      Cardiovascular hypertension, Pt. on medications + Past MI (silent)  (-) Cardiac Stents and (-) CABG + pacemaker (placed for SSS)  Rhythm:Regular Rate:Normal - Systolic murmurs and - Diastolic murmurs    Neuro/Psych neg Seizures negative neurological ROS  negative psych ROS   GI/Hepatic negative GI ROS, Neg liver ROS,   Endo/Other  diabetes (diet controlled)Hypothyroidism   Renal/GU negative Renal ROS     Musculoskeletal negative musculoskeletal ROS (+)   Abdominal (+) + obese,   Peds  Hematology negative hematology ROS (+)   Anesthesia Other Findings Past Medical History: No date: Asthma No date: Cancer ([place])     Comment:  skin No date: Complication of anesthesia     Comment:  previous pacemaker patient could feel everything No date: Diabetes mellitus without complication (HCC)     Comment:  diet controlled No date: Dyspnea No date: Dysrhythmia No date: Elevated lipids No date: Hypertension No date: Hypothyroidism No date: Myocardial infarction ([place]) No date: Presence of permanent cardiac pacemaker   Reproductive/Obstetrics                             Anesthesia Physical Anesthesia Plan  ASA: III  Anesthesia Plan: General   Post-op Pain Management:    Induction: Intravenous  PONV Risk Score and Plan: 1 and Ondansetron  Airway Management Planned: LMA  Additional Equipment:    Intra-op Plan:   Post-operative Plan:   Informed Consent: I have reviewed the patients History and Physical, chart, labs and discussed the procedure including the risks, benefits and alternatives for the proposed anesthesia with the patient or authorized representative who has indicated his/her understanding and acceptance.     Dental advisory given  Plan Discussed with: CRNA and Anesthesiologist  Anesthesia Plan Comments:         Anesthesia Quick Evaluation

## 2020-06-20 NOTE — Interval H&P Note (Signed)
History and Physical Interval Note:  CV: RRR Lungs: Clear   06/20/2020 7:29 AM  Samuel Mack  has presented today for surgery, with the diagnosis of bladder neck contracture.  The various methods of treatment have been discussed with the patient and family. After consideration of risks, benefits and other options for treatment, the patient has consented to  Procedure(s): [place] (N/A) as a surgical intervention.  The patient's history has been reviewed, patient examined, no change in status, stable for surgery.  I have reviewed the patient's chart and labs.  Questions were answered to the patient's satisfaction.     [place]

## 2020-06-20 NOTE — Anesthesia Postprocedure Evaluation (Signed)
Anesthesia Post Note  Patient: Samuel Mack  Procedure(s) Performed: TRANSURETHRAL RESECTION OF THE PROSTATE (TURP) (N/A Prostate)  Patient location during evaluation: PACU Anesthesia Type: General Level of consciousness: awake and alert Pain management: pain level controlled Vital Signs Assessment: post-procedure vital signs reviewed and stable Respiratory status: spontaneous breathing, nonlabored ventilation, respiratory function stable and patient connected to nasal cannula oxygen Cardiovascular status: blood pressure returned to baseline and stable Postop Assessment: no apparent nausea or vomiting Anesthetic complications: no   No complications documented.   Last Vitals:  Vitals:   06/20/20 0934 06/20/20 0951  BP: 119/72 130/71  Pulse:    Resp: 18 18  Temp:  36.8 C  SpO2: 98% 99%    Last Pain:  Vitals:   06/20/20 0934  TempSrc: Tympanic  PainSc: 0-No pain                 Martha Clan

## 2020-06-21 LAB — SURGICAL PATHOLOGY

## 2020-06-23 ENCOUNTER — Other Ambulatory Visit: Payer: Self-pay

## 2020-06-23 ENCOUNTER — Ambulatory Visit (INDEPENDENT_AMBULATORY_CARE_PROVIDER_SITE_OTHER): Payer: MEDICARE | Admitting: Physician Assistant

## 2020-06-23 ENCOUNTER — Ambulatory Visit: Payer: MEDICARE | Admitting: Physician Assistant

## 2020-06-23 VITALS — BP 118/77 | HR 86 | Ht 67.0 in | Wt 233.0 lb

## 2020-06-23 DIAGNOSIS — N35919 Unspecified urethral stricture, male, unspecified site: Secondary | ICD-10-CM

## 2020-06-23 NOTE — Progress Notes (Signed)
Catheter Removal  Patient is present today for a catheter removal.  58ml of water was drained from the balloon. An 18FR foley cath was removed from the bladder no complications were noted . Patient tolerated well.  Performed by: Debroah Loop, PA-C   Follow up/ Additional notes: Return in about 4 weeks (around 07/21/2020) for Postop follow-up and PVR with Dr. Bernardo Heater.

## 2020-07-20 ENCOUNTER — Ambulatory Visit (INDEPENDENT_AMBULATORY_CARE_PROVIDER_SITE_OTHER): Payer: MEDICARE | Admitting: Internal Medicine

## 2020-07-20 ENCOUNTER — Other Ambulatory Visit: Payer: Self-pay

## 2020-07-20 VITALS — BP 153/79 | HR 69

## 2020-07-20 DIAGNOSIS — J301 Allergic rhinitis due to pollen: Secondary | ICD-10-CM

## 2020-07-20 DIAGNOSIS — Z950 Presence of cardiac pacemaker: Secondary | ICD-10-CM

## 2020-07-20 DIAGNOSIS — I10 Essential (primary) hypertension: Secondary | ICD-10-CM

## 2020-07-20 DIAGNOSIS — I495 Sick sinus syndrome: Secondary | ICD-10-CM

## 2020-07-20 NOTE — Progress Notes (Signed)
07/21/2020 10:46 AM   Samuel Mack [birth year] [id number]  Referring provider: Casilda Carls, [place],  [place] [address] Chief Complaint  Patient presents with   Other    HPI: Samuel Mack is a 86 y.o. male who returns for a 4 weeks post op TURP on 06/20/2020.    Suprapubic prostatectomy by Dr. Quillian Quince 03/2008  He seen 04/2020 with several year history of severe LUTS  Office cystoscopy noted to have bladder neck contracture  Scheduled for transurethral incision bladder neck contracture 06/20/2020 however intraoperatively found to have a stricture of the distal prostatic urethra and not bladder neck; underwent dilation and TUR residual prostatic adenoma  Voiding with an excellent stream; denies urinary incontinence  Still with nocturia x3-4  Patient reports some ED issues which are longstanding.  No improvement with Viagra.    PMH: Past Medical History:  Diagnosis Date   Asthma    Cancer ([facility])    skin   Complication of anesthesia    previous pacemaker patient could feel everything   Diabetes mellitus without complication ([place] [facility])    diet controlled   Dyspnea    Dysrhythmia    Elevated lipids    Hypertension    Hypothyroidism    Myocardial infarction [facility])    Presence of permanent cardiac pacemaker     Surgical History: Past Surgical History:  Procedure Laterality Date   CATARACT EXTRACTION W/PHACO Left 12/11/2016   Procedure: CATARACT EXTRACTION PHACO AND INTRAOCULAR LENS PLACEMENT ([place]);  Surgeon: Leandrew Koyanagi, MD;  Location: [facility];  Service: Ophthalmology;  Laterality: Left;  IVA TOPICAL LEFT   EYE SURGERY Left    cataract   NASAL SINUS SURGERY     PACEMAKER GENERATOR CHANGE Left    x2   PACEMAKER INSERTION  2002   PACEMAKER INSERTION Left 05/12/2019   Procedure: INSERTION PACEMAKER, LEFT;  Surgeon: Cletis Athens, MD;  Location: [facility];  Service: Cardiovascular;  Laterality: Left;   PROSTATE  SURGERY     TRANSURETHRAL RESECTION OF PROSTATE N/A 06/20/2020   Procedure: TRANSURETHRAL RESECTION OF THE PROSTATE (TURP);  Surgeon: [name], MD;  Location: [facility];  Service: Urology;  Laterality: N/A;    Home Medications:  Allergies as of 07/21/2020      Reactions   Bee Venom Swelling   Penicillins Swelling   Patient reports he swells and it was "something in the pcn" 50 year ago      Medication List       Accurate as of July 21, 2020 10:46 AM. If you have any questions, ask your nurse or doctor.        acetaminophen 650 MG CR tablet Commonly known as: Tylenol 8 Hour Take 1 tablet (650 mg total) by mouth every 8 (eight) hours as needed for pain.   aspirin 81 MG tablet Take 81 mg by mouth daily.   atorvastatin 10 MG tablet Commonly known as: LIPITOR Take 10 mg by mouth at bedtime.   cholecalciferol 25 MCG (1000 UNIT) tablet Commonly known as: VITAMIN D Take 1,000 Units by mouth in the morning and at bedtime.   Coenzyme Q10 100 MG Tabs Take 100 mg by mouth daily.   digoxin 0.125 MG tablet Commonly known as: LANOXIN Take 1 tablet (125 mcg total) by mouth daily.   diltiazem 180 MG 24 hr capsule Commonly known as: DILACOR XR Take 180 mg by mouth daily.   Fish Oil 1200 MG Caps Take 1,200 mg by mouth 3 (  three) times daily.   levocetirizine 5 MG tablet Commonly known as: XYZAL Take 5 mg by mouth every evening.   levothyroxine 175 MCG tablet Commonly known as: SYNTHROID Take 175 mcg by mouth daily before breakfast.   losartan 100 MG tablet Commonly known as: COZAAR Take 100 mg by mouth daily.   nebivolol 10 MG tablet Commonly known as: BYSTOLIC Take 10 mg by mouth daily.   nitrofurantoin (macrocrystal-monohydrate) 100 MG capsule Commonly known as: MACROBID Take 1 capsule (100 mg total) by mouth 2 (two) times daily.   potassium gluconate 595 (99 K) MG Tabs tablet Take 595 mg by mouth daily.   PRESERVISION AREDS PO Take by mouth 2 (two)  times daily.   spironolactone 25 MG tablet Commonly known as: ALDACTONE Take 25 mg by mouth daily.   vitamin B-12 1000 MCG tablet Commonly known as: CYANOCOBALAMIN Take 1,000 mcg by mouth daily.   vitamin C 500 MG tablet Commonly known as: ASCORBIC ACID Take 500 mg by mouth daily.   vitamin E 180 MG (400 UNITS) capsule Generic drug: vitamin E Take 400 Units by mouth daily.       Allergies:  Allergies  Allergen Reactions   Bee Venom Swelling   Penicillins Swelling    Patient reports he swells and it was "something in the pcn" 50 year ago    Family History: No family history on file.  Social History:  reports that he quit smoking about 41 years ago. He has never used smokeless tobacco. He reports that he does not drink alcohol and does not use drugs.   Physical Exam: BP (!) 142/80    Pulse 74    Ht 5\' 8"  (1.727 m)    Wt 233 lb (105.7 kg)    BMI 35.43 kg/m   Constitutional:  Alert and oriented, No acute distress. HEENT: [place] AT, moist mucus membranes.  Trachea midline, no masses. Cardiovascular: No clubbing, cyanosis, or edema. Respiratory: Normal respiratory effort, no increased work of breathing. Skin: No rashes, bruises or suspicious lesions. Neurologic: Grossly intact, no focal deficits, moving all 4 extremities. Psychiatric: Normal mood and affect.  Laboratory Data:  Lab Results  Component Value Date   CREATININE 1.51 (H) 06/16/2020    Pertinent Imaging: Results for orders placed or performed in visit on 07/21/20  Bladder Scan (Post Void Residual) in office  Result Value Ref Range   Scan Result 45      Assessment & Plan:    1. History of distal prostatic urethral stricture  S/p TURP on 06/20/2020  Bladder scan PVR 45 mL  Voiding without problems  We discussed increased incidence of recurrent stricture and will schedule cystoscopy 3 months  2. ED  Longstanding  Previously failed PDE 5-inhibitors  No partial erections.  Second line  options discussed including intracavernosal injections and vacuum erection devices; he does not desire to proceed   [facility] [address], [place], [place] [address] [phone]   I, Selena Batten, am acting as a scribe for Dr. Nicki Reaper C. Chakita Mcgraw,  I have reviewed the above documentation for accuracy and completeness, and I agree with the above.    [name], MD

## 2020-07-21 ENCOUNTER — Ambulatory Visit (INDEPENDENT_AMBULATORY_CARE_PROVIDER_SITE_OTHER): Payer: MEDICARE | Admitting: Urology

## 2020-07-21 ENCOUNTER — Other Ambulatory Visit: Payer: Self-pay

## 2020-07-21 ENCOUNTER — Encounter: Payer: Self-pay | Admitting: Urology

## 2020-07-21 VITALS — BP 142/80 | HR 74 | Ht 68.0 in | Wt 233.0 lb

## 2020-07-21 DIAGNOSIS — Z87448 Personal history of other diseases of urinary system: Secondary | ICD-10-CM | POA: Diagnosis not present

## 2020-07-21 DIAGNOSIS — N3941 Urge incontinence: Secondary | ICD-10-CM

## 2020-07-21 LAB — BLADDER SCAN AMB NON-IMAGING: Scan Result: 45

## 2020-07-23 ENCOUNTER — Encounter: Payer: Self-pay | Admitting: Urology

## 2020-07-23 ENCOUNTER — Encounter: Payer: Self-pay | Admitting: Internal Medicine

## 2020-07-23 DIAGNOSIS — I4811 Longstanding persistent atrial fibrillation: Secondary | ICD-10-CM | POA: Insufficient documentation

## 2020-07-23 NOTE — Assessment & Plan Note (Signed)
Patient pacemaker was analyzed today and it was found to be functioning well.

## 2020-07-23 NOTE — Assessment & Plan Note (Signed)
Advised to take Claritin 5 mg p.o. as needed for rhinitis.

## 2020-07-23 NOTE — Assessment & Plan Note (Signed)
Patient does not have any arrhythmia at the present time he denies any chest pain chest is clear

## 2020-07-23 NOTE — Progress Notes (Signed)
Established Patient Office Visit  Subjective:  Patient ID: Samuel Mack, male    DOB: [birth year]  Age: 86 y.o. MRN: [id number]  CC:  Chief Complaint  Patient presents with  . Pacemaker Check    HPI  Samuel Mack presents for pacer check and office visit.  He denies any chest pain or shortness of breath.  Past Medical History:  Diagnosis Date  . Asthma   . Cancer ([place])    skin  . Complication of anesthesia    previous pacemaker patient could feel everything  . Diabetes mellitus without complication ([facility])    diet controlled  . Dyspnea   . Dysrhythmia   . Elevated lipids   . Hypertension   . Hypothyroidism   . Myocardial infarction ([facility] [place])   . Presence of permanent cardiac pacemaker     Past Surgical History:  Procedure Laterality Date  . CATARACT EXTRACTION W/PHACO Left 12/11/2016   Procedure: CATARACT EXTRACTION PHACO AND INTRAOCULAR LENS PLACEMENT ([facility]);  Surgeon: Leandrew Koyanagi, MD;  Location: [facility];  Service: Ophthalmology;  Laterality: Left;  IVA TOPICAL LEFT  . EYE SURGERY Left    cataract  . NASAL SINUS SURGERY    . PACEMAKER GENERATOR CHANGE Left    x2  . PACEMAKER INSERTION  2002  . PACEMAKER INSERTION Left 05/12/2019   Procedure: INSERTION PACEMAKER, LEFT;  Surgeon: Cletis Athens, MD;  Location: [facility];  Service: Cardiovascular;  Laterality: Left;  . PROSTATE SURGERY    . TRANSURETHRAL RESECTION OF PROSTATE N/A 06/20/2020   Procedure: TRANSURETHRAL RESECTION OF THE PROSTATE (TURP);  Surgeon: [name], MD;  Location: [facility];  Service: Urology;  Laterality: N/A;    History reviewed. No pertinent family history.  Social History   Socioeconomic History  . Marital status: Widowed    Spouse name: Not on file  . Number of children: Not on file  . Years of education: Not on file  . Highest education level: Not on file  Occupational History  . Not on file  Tobacco Use  . Smoking status: Former Smoker    Quit date:  01/26/1979    Years since quitting: 41.5  . Smokeless tobacco: Never Used  Vaping Use  . Vaping Use: Never used  Substance and Sexual Activity  . Alcohol use: No  . Drug use: No  . Sexual activity: Not on file  Other Topics Concern  . Not on file  Social History Narrative  . Not on file   Social Determinants of Health   Financial Resource Strain:   . Difficulty of Paying Living Expenses: Not on file  Food Insecurity:   . Worried About Charity fundraiser in the Last Year: Not on file  . Ran Out of Food in the Last Year: Not on file  Transportation Needs:   . Lack of Transportation (Medical): Not on file  . Lack of Transportation (Non-Medical): Not on file  Physical Activity:   . Days of Exercise per Week: Not on file  . Minutes of Exercise per Session: Not on file  Stress:   . Feeling of Stress : Not on file  Social Connections:   . Frequency of Communication with Friends and Family: Not on file  . Frequency of Social Gatherings with Friends and Family: Not on file  . Attends Religious Services: Not on file  . Active Member of Clubs or Organizations: Not on file  . Attends Archivist Meetings: Not on file  .  Marital Status: Not on file  Intimate Partner Violence:   . Fear of Current or Ex-Partner: Not on file  . Emotionally Abused: Not on file  . Physically Abused: Not on file  . Sexually Abused: Not on file     Current Outpatient Medications:  .  acetaminophen (TYLENOL 8 HOUR) 650 MG CR tablet, Take 1 tablet (650 mg total) by mouth every 8 (eight) hours as needed for pain., Disp: 2 tablet, Rfl: 1 .  aspirin 81 MG tablet, Take 81 mg by mouth daily., Disp: , Rfl:  .  atorvastatin (LIPITOR) 10 MG tablet, Take 10 mg by mouth at bedtime., Disp: , Rfl:  .  cholecalciferol (VITAMIN D) 25 MCG (1000 UNIT) tablet, Take 1,000 Units by mouth in the morning and at bedtime., Disp: , Rfl:  .  Coenzyme Q10 100 MG TABS, Take 100 mg by mouth daily. , Disp: , Rfl:  .  digoxin  (LANOXIN) 0.125 MG tablet, Take 1 tablet (125 mcg total) by mouth daily. (Patient taking differently: Take 0.125 mg by mouth daily. ), Disp: 90 tablet, Rfl: 3 .  diltiazem (DILACOR XR) 180 MG 24 hr capsule, Take 180 mg by mouth daily., Disp: , Rfl:  .  levocetirizine (XYZAL) 5 MG tablet, Take 5 mg by mouth every evening., Disp: , Rfl:  .  levothyroxine (SYNTHROID) 175 MCG tablet, Take 175 mcg by mouth daily before breakfast. , Disp: , Rfl:  .  losartan (COZAAR) 100 MG tablet, Take 100 mg by mouth daily., Disp: , Rfl:  .  Multiple Vitamins-Minerals (PRESERVISION AREDS PO), Take by mouth 2 (two) times daily., Disp: , Rfl:  .  nebivolol (BYSTOLIC) 10 MG tablet, Take 10 mg by mouth daily., Disp: , Rfl:  .  nitrofurantoin, macrocrystal-monohydrate, (MACROBID) 100 MG capsule, Take 1 capsule (100 mg total) by mouth 2 (two) times daily., Disp: 14 capsule, Rfl: 0 .  Omega-3 Fatty Acids (FISH OIL) 1200 MG CAPS, Take 1,200 mg by mouth 3 (three) times daily., Disp: , Rfl:  .  potassium gluconate 595 (99 K) MG TABS tablet, Take 595 mg by mouth daily., Disp: , Rfl:  .  spironolactone (ALDACTONE) 25 MG tablet, Take 25 mg by mouth daily., Disp: , Rfl:  .  vitamin B-12 (CYANOCOBALAMIN) 1000 MCG tablet, Take 1,000 mcg by mouth daily., Disp: , Rfl:  .  vitamin C (ASCORBIC ACID) 500 MG tablet, Take 500 mg by mouth daily., Disp: , Rfl:  .  vitamin E (VITAMIN E) 180 MG (400 UNITS) capsule, Take 400 Units by mouth daily., Disp: , Rfl:    Allergies  Allergen Reactions  . Bee Venom Swelling  . Penicillins Swelling    Patient reports he swells and it was "something in the pcn" 50 year ago    ROS Review of Systems  Constitutional: Negative.   HENT: Negative.   Eyes: Negative.   Respiratory: Negative.   Cardiovascular: Negative.   Gastrointestinal: Negative.   Endocrine: Negative.   Genitourinary: Negative.   Musculoskeletal: Negative.   Skin: Negative.   Allergic/Immunologic: Negative.   Neurological:  Negative.   Hematological: Negative.   Psychiatric/Behavioral: Negative.   All other systems reviewed and are negative.     Objective:    Physical Exam Vitals reviewed.  HENT:     Head: Normocephalic.  Eyes:     Pupils: Pupils are equal, round, and reactive to light.  Cardiovascular:     Rate and Rhythm: Normal rate.     Heart sounds: Murmur heard.  Pulmonary:     Effort: Pulmonary effort is normal. No respiratory distress.     Breath sounds: No wheezing.  Abdominal:     General: Abdomen is flat. Bowel sounds are normal.  Musculoskeletal:     Cervical back: Normal range of motion.  Neurological:     General: No focal deficit present.     BP (!) 153/79   Pulse 69  Wt Readings from Last 3 Encounters:  07/21/20 233 lb (105.7 kg)  06/23/20 233 lb (105.7 kg)  06/20/20 225 lb 1.4 oz (102.1 kg)     Health Maintenance Due  Topic Date Due  . TETANUS/TDAP  Never done  . PNA vac Low Risk Adult (1 of 2 - PCV13) Never done  . INFLUENZA VACCINE  04/16/2020    There are no preventive care reminders to display for this patient.  No results found for: TSH Lab Results  Component Value Date   WBC 13.6 (H) 06/16/2020   HGB 13.3 06/16/2020   HCT 39.7 06/16/2020   MCV 92.1 06/16/2020   PLT 204 06/16/2020   Lab Results  Component Value Date   NA 132 (L) 06/16/2020   K 4.3 06/16/2020   CO2 22 06/16/2020   GLUCOSE 100 (H) 06/16/2020   BUN 23 06/16/2020   CREATININE 1.51 (H) 06/16/2020   BILITOT 0.7 05/10/2019   ALKPHOS 69 05/10/2019   AST 23 05/10/2019   ALT 22 05/10/2019   PROT 6.9 05/10/2019   ALBUMIN 3.8 05/10/2019   CALCIUM 9.3 06/16/2020   ANIONGAP 10 06/16/2020   No results found for: CHOL No results found for: HDL No results found for: LDLCALC No results found for: TRIG No results found for: CHOLHDL No results found for: HGBA1C    Assessment & Plan:   Problem List Items Addressed This Visit      Cardiovascular and Mediastinum   Sick sinus  syndrome [facility])    Patient does not have any arrhythmia at the present time, he denies any chest pain ,chest is clear      Essential hypertension    Patient blood pressure is elevated today.  He continues to have problems with obesity. he was advised to follow DASH diet walk on a daily basis and continue taking his beta-blocker        Respiratory   Seasonal allergic rhinitis due to pollen    Advised to take Claritin 5 mg p.o. as needed for rhinitis.        Other   Cardiac pacemaker in situ - Primary    Patient pacemaker was analyzed today and it was found to be functioning well.      Relevant Orders   PACEMAKER IN CLINIC CHECK    Patient pacemaker was interrogated by pacemakers analyzer, battery status is okay.  No programming changes were indicated after the review of the data.  Histogram shows no change since the last interrogation Atrial and ventricular sensing thresholds were found to be acceptable Impedance was checked and it was found to be normal.  Thresholds were found to be okay on evaluation of rhythm problem.  No high rate or low rate arrhythmia were noted.  Estimated battery longevity is 10 yrs. I have personally reviewed the device data and amended the report as necessary.   No orders of the defined types were placed in this encounter.   Follow-up: No follow-ups on file.    Cletis Athens, MD

## 2020-07-23 NOTE — Assessment & Plan Note (Signed)
Patient blood pressure is elevated today.  He continues to have problems with obesity he was advised to follow DASH diet walk on a daily basis and continue taking his beta-blocker

## 2020-07-24 ENCOUNTER — Telehealth: Payer: Self-pay | Admitting: *Deleted

## 2020-07-24 NOTE — Telephone Encounter (Signed)
[name] calling stating that pt is passing small clots, wanted to know if this was normal after surgery.

## 2020-07-25 NOTE — Telephone Encounter (Signed)
Notified patient as instructed.   

## 2020-07-25 NOTE — Telephone Encounter (Signed)
Yes, that is common

## 2020-10-23 ENCOUNTER — Other Ambulatory Visit: Payer: Self-pay

## 2020-10-23 ENCOUNTER — Other Ambulatory Visit: Payer: Self-pay | Admitting: Urology

## 2020-10-23 ENCOUNTER — Ambulatory Visit (INDEPENDENT_AMBULATORY_CARE_PROVIDER_SITE_OTHER): Payer: MEDICARE | Admitting: Internal Medicine

## 2020-10-23 VITALS — BP 130/78 | HR 86

## 2020-10-23 DIAGNOSIS — I10 Essential (primary) hypertension: Secondary | ICD-10-CM | POA: Diagnosis not present

## 2020-10-23 DIAGNOSIS — Z950 Presence of cardiac pacemaker: Secondary | ICD-10-CM

## 2020-10-23 DIAGNOSIS — J301 Allergic rhinitis due to pollen: Secondary | ICD-10-CM

## 2020-10-25 ENCOUNTER — Other Ambulatory Visit: Payer: Self-pay

## 2020-10-25 ENCOUNTER — Encounter: Payer: Self-pay | Admitting: Urology

## 2020-10-25 ENCOUNTER — Ambulatory Visit (INDEPENDENT_AMBULATORY_CARE_PROVIDER_SITE_OTHER): Payer: MEDICARE | Admitting: Urology

## 2020-10-25 VITALS — BP 121/70 | HR 88 | Ht 68.0 in | Wt 230.0 lb

## 2020-10-25 DIAGNOSIS — Z87448 Personal history of other diseases of urinary system: Secondary | ICD-10-CM | POA: Diagnosis not present

## 2020-10-25 LAB — URINALYSIS, COMPLETE
Bilirubin, UA: NEGATIVE
Glucose, UA: NEGATIVE
Ketones, UA: NEGATIVE
Leukocytes,UA: NEGATIVE
Nitrite, UA: NEGATIVE
Protein,UA: NEGATIVE
RBC, UA: NEGATIVE
Specific Gravity, UA: 1.015 (ref 1.005–1.030)
Urobilinogen, Ur: 2 mg/dL — ABNORMAL HIGH (ref 0.2–1.0)
pH, UA: 6.5 (ref 5.0–7.5)

## 2020-10-25 LAB — MICROSCOPIC EXAMINATION: Bacteria, UA: NONE SEEN

## 2020-10-25 NOTE — Progress Notes (Signed)
   10/25/20  CC:  Chief Complaint  Patient presents with  . Cysto    HPI: History of progressive lower urinary tract symptoms which became severe after suprapubic prostatectomy by Dr. Quillian Quince in 2009.  Underwent dilation and resection of a prostatic urethral stricture October 2021  Postop visit he was void without problems with a PVR of 45 mL.  Follow-up cystoscopy was recommended to assess for recurrent stricture  No complaints today.  Blood pressure 121/70, pulse 88, height 5\' 8"  (1.727 m), weight 230 lb (104.3 kg). NED. A&Ox3.   No respiratory distress   Abd soft, NT, ND Normal phallus with bilateral descended testicles  Cystoscopy Procedure Note  Patient identification was confirmed, informed consent was obtained, and patient was prepped using Betadine solution.  Lidocaine jelly was administered per urethral meatus.     Pre-Procedure: - Inspection reveals a normal caliber urethral meatus.  Procedure: The flexible cystoscope was introduced without difficulty - No urethral strictures/lesions are present. - No prostatic urethral stricture or bladder neck contracture.  Prostatic fossa open prostate  - Normal bladder neck - Bilateral ureteral orifices identified - Bladder mucosa  reveals no ulcers, tumors, or lesions - No bladder stones - No trabeculation  Retroflexion shows no abnormalities   Post-Procedure: - Patient tolerated the procedure well  Assessment/ Plan:  No evidence of recurrent stricture or bladder neck contracture  Follow-up prn   [name], MD

## 2020-10-27 NOTE — Assessment & Plan Note (Signed)
Pacer working well

## 2020-10-27 NOTE — Assessment & Plan Note (Signed)
Take Claritin 5 mg p.o. daily 

## 2020-10-27 NOTE — Assessment & Plan Note (Signed)
-   Today, the patient's blood pressure is well managed on med. - The patient will continue the current treatment regimen.  - I encouraged the patient to eat a low-sodium diet to help control blood pressure. - I encouraged the patient to live an active lifestyle and complete activities that increases heart rate to 85% target heart rate at least 5 times per week for one hour.

## 2020-10-27 NOTE — Progress Notes (Signed)
Established Patient Office Visit  Subjective:  Patient ID: Samuel Mack, male    DOB: October 11, 1929  Age: 86 y.o. MRN: [id number]  CC: No chief complaint on file.   HPI  Samuel Mack presents for pacer check,diabetes and sob  Past Medical History:  Diagnosis Date  . Asthma   . Cancer ([place])    skin  . Complication of anesthesia    previous pacemaker patient could feel everything  . Diabetes mellitus without complication (HCC)    diet controlled  . Dyspnea   . Dysrhythmia   . Elevated lipids   . Hypertension   . Hypothyroidism   . Myocardial infarction ([place])   . Presence of permanent cardiac pacemaker     Past Surgical History:  Procedure Laterality Date  . CATARACT EXTRACTION W/PHACO Left 12/11/2016   Procedure: CATARACT EXTRACTION PHACO AND INTRAOCULAR LENS PLACEMENT (IOC);  Surgeon: Leandrew Koyanagi, MD;  Location: [facility];  Service: Ophthalmology;  Laterality: Left;  IVA TOPICAL LEFT  . EYE SURGERY Left    cataract  . NASAL SINUS SURGERY    . PACEMAKER GENERATOR CHANGE Left    x2  . PACEMAKER INSERTION  2002  . PACEMAKER INSERTION Left 05/12/2019   Procedure: INSERTION PACEMAKER, LEFT;  Surgeon: Cletis Athens, MD;  Location: [facility];  Service: Cardiovascular;  Laterality: Left;  . PROSTATE SURGERY    . TRANSURETHRAL RESECTION OF PROSTATE N/A 06/20/2020   Procedure: TRANSURETHRAL RESECTION OF THE PROSTATE (TURP);  Surgeon: [name], MD;  Location: [facility];  Service: Urology;  Laterality: N/A;    No family history on file.  Social History   Socioeconomic History  . Marital status: Widowed    Spouse name: Not on file  . Number of children: Not on file  . Years of education: Not on file  . Highest education level: Not on file  Occupational History  . Not on file  Tobacco Use  . Smoking status: Former Smoker    Quit date: 01/26/1979    Years since quitting: 41.7  . Smokeless tobacco: Never Used  Vaping Use  . Vaping Use: Never used   Substance and Sexual Activity  . Alcohol use: No  . Drug use: No  . Sexual activity: Not on file  Other Topics Concern  . Not on file  Social History Narrative  . Not on file   Social Determinants of Health   Financial Resource Strain: Not on file  Food Insecurity: Not on file  Transportation Needs: Not on file  Physical Activity: Not on file  Stress: Not on file  Social Connections: Not on file  Intimate Partner Violence: Not on file     Current Outpatient Medications:  .  acetaminophen (TYLENOL 8 HOUR) 650 MG CR tablet, Take 1 tablet (650 mg total) by mouth every 8 (eight) hours as needed for pain., Disp: 2 tablet, Rfl: 1 .  aspirin 81 MG tablet, Take 81 mg by mouth daily., Disp: , Rfl:  .  atorvastatin (LIPITOR) 10 MG tablet, Take 10 mg by mouth at bedtime., Disp: , Rfl:  .  cholecalciferol (VITAMIN D) 25 MCG (1000 UNIT) tablet, Take 1,000 Units by mouth in the morning and at bedtime., Disp: , Rfl:  .  Coenzyme Q10 100 MG TABS, Take 100 mg by mouth daily. , Disp: , Rfl:  .  digoxin (LANOXIN) 0.125 MG tablet, Take 1 tablet (125 mcg total) by mouth daily. (Patient taking differently: Take 0.125 mg by mouth daily.),  Disp: 90 tablet, Rfl: 3 .  diltiazem (DILACOR XR) 180 MG 24 hr capsule, Take 180 mg by mouth daily., Disp: , Rfl:  .  levocetirizine (XYZAL) 5 MG tablet, Take 5 mg by mouth every evening., Disp: , Rfl:  .  levothyroxine (SYNTHROID) 175 MCG tablet, Take 175 mcg by mouth daily before breakfast. , Disp: , Rfl:  .  losartan (COZAAR) 100 MG tablet, Take 100 mg by mouth daily., Disp: , Rfl:  .  Multiple Vitamins-Minerals (PRESERVISION AREDS PO), Take by mouth 2 (two) times daily., Disp: , Rfl:  .  nebivolol (BYSTOLIC) 10 MG tablet, Take 10 mg by mouth daily., Disp: , Rfl:  .  nitrofurantoin, macrocrystal-monohydrate, (MACROBID) 100 MG capsule, Take 1 capsule (100 mg total) by mouth 2 (two) times daily., Disp: 14 capsule, Rfl: 0 .  Omega-3 Fatty Acids (FISH OIL) 1200 MG  CAPS, Take 1,200 mg by mouth 3 (three) times daily., Disp: , Rfl:  .  potassium gluconate 595 (99 K) MG TABS tablet, Take 595 mg by mouth daily., Disp: , Rfl:  .  spironolactone (ALDACTONE) 25 MG tablet, Take 25 mg by mouth daily., Disp: , Rfl:  .  vitamin B-12 (CYANOCOBALAMIN) 1000 MCG tablet, Take 1,000 mcg by mouth daily., Disp: , Rfl:  .  vitamin C (ASCORBIC ACID) 500 MG tablet, Take 500 mg by mouth daily., Disp: , Rfl:  .  vitamin E 180 MG (400 UNITS) capsule, Take 400 Units by mouth daily., Disp: , Rfl:    Allergies  Allergen Reactions  . Bee Venom Swelling  . Penicillins Swelling    Patient reports he swells and it was "something in the pcn" 50 year ago    ROS Review of Systems  Constitutional: Negative.   HENT: Negative.   Eyes: Negative.   Respiratory: Negative.   Cardiovascular: Negative.   Gastrointestinal: Negative.   Endocrine: Negative.   Genitourinary: Negative.   Musculoskeletal: Negative.   Skin: Negative.   Allergic/Immunologic: Negative.   Neurological: Negative.   Hematological: Negative.   Psychiatric/Behavioral: Negative.   All other systems reviewed and are negative.     Objective:    Physical Exam Vitals reviewed.  Constitutional:      Appearance: Normal appearance. He is obese.  HENT:     Mouth/Throat:     Mouth: Mucous membranes are moist.  Eyes:     Pupils: Pupils are equal, round, and reactive to light.  Neck:     Vascular: No carotid bruit.  Cardiovascular:     Rate and Rhythm: Normal rate and regular rhythm.     Pulses: Normal pulses.     Heart sounds: Normal heart sounds.  Pulmonary:     Effort: Pulmonary effort is normal.     Breath sounds: Normal breath sounds. No rhonchi.  Abdominal:     General: Bowel sounds are normal.     Palpations: Abdomen is soft. There is no hepatomegaly, splenomegaly or mass.     Tenderness: There is no abdominal tenderness.     Hernia: No hernia is present.  Musculoskeletal:     Cervical back:  Neck supple.     Right lower leg: No edema.     Left lower leg: No edema.  Skin:    Coloration: Skin is not jaundiced.     Findings: No rash.  Neurological:     Mental Status: He is alert and oriented to person, place, and time.     Motor: No weakness.  Psychiatric:  Mood and Affect: Mood normal.        Behavior: Behavior normal.     BP 130/78   Pulse 86  Wt Readings from Last 3 Encounters:  10/25/20 230 lb (104.3 kg)  07/21/20 233 lb (105.7 kg)  06/23/20 233 lb (105.7 kg)     Health Maintenance Due  Topic Date Due  . TETANUS/TDAP  Never done  . PNA vac Low Risk Adult (1 of 2 - PCV13) Never done  . INFLUENZA VACCINE  04/16/2020  . COVID-19 Vaccine (3 - Booster for Pfizer series) 04/16/2020    There are no preventive care reminders to display for this patient.  No results found for: TSH Lab Results  Component Value Date   WBC 13.6 (H) 06/16/2020   HGB 13.3 06/16/2020   HCT 39.7 06/16/2020   MCV 92.1 06/16/2020   PLT 204 06/16/2020   Lab Results  Component Value Date   NA 132 (L) 06/16/2020   K 4.3 06/16/2020   CO2 22 06/16/2020   GLUCOSE 100 (H) 06/16/2020   BUN 23 06/16/2020   CREATININE 1.51 (H) 06/16/2020   BILITOT 0.7 05/10/2019   ALKPHOS 69 05/10/2019   AST 23 05/10/2019   ALT 22 05/10/2019   PROT 6.9 05/10/2019   ALBUMIN 3.8 05/10/2019   CALCIUM 9.3 06/16/2020   ANIONGAP 10 06/16/2020   No results found for: CHOL No results found for: HDL No results found for: LDLCALC No results found for: TRIG No results found for: CHOLHDL No results found for: HGBA1C    Assessment & Plan:   Problem List Items Addressed This Visit   None   Visit Diagnoses    Pacemaker    -  Primary   Relevant Orders   PACEMAKER IN CLINIC CHECK (Completed)     IMPRESSION:  Note: Medical Device Follow-up   Patient pacemaker was interrogated by pacemakers analyzer, battery status is okay. No programming changes were indicated after the review of the data.  Histogram shows no change since the last interrogation  Atrial and ventricular sensing thresholds were found to be acceptable  Impedance was checked and it was found to be normal. Thresholds were found to be okay on evaluation of rhythm problem. No high rate or low rate arrhythmia were noted. Estimated battery longevity is 117 months. I have personally reviewed the device data and amended the report as necessary.  Report approved and finalized on       No orders of the defined types were placed in this encounter.   Follow-up: No follow-ups on file.    Cletis Athens, MD

## 2021-01-22 ENCOUNTER — Ambulatory Visit (INDEPENDENT_AMBULATORY_CARE_PROVIDER_SITE_OTHER): Payer: MEDICARE | Admitting: Internal Medicine

## 2021-01-22 VITALS — BP 135/89 | HR 82

## 2021-01-22 DIAGNOSIS — J301 Allergic rhinitis due to pollen: Secondary | ICD-10-CM

## 2021-01-22 DIAGNOSIS — I495 Sick sinus syndrome: Secondary | ICD-10-CM

## 2021-01-22 DIAGNOSIS — I10 Essential (primary) hypertension: Secondary | ICD-10-CM | POA: Diagnosis not present

## 2021-01-22 DIAGNOSIS — Z950 Presence of cardiac pacemaker: Secondary | ICD-10-CM

## 2021-01-27 ENCOUNTER — Encounter: Payer: Self-pay | Admitting: Internal Medicine

## 2021-01-27 NOTE — Assessment & Plan Note (Signed)
Blood pressure stable on the present medication. 

## 2021-01-27 NOTE — Assessment & Plan Note (Signed)
Patient pacemaker was checked and was found to be functioning well.

## 2021-01-27 NOTE — Progress Notes (Signed)
Established Patient Office Visit  Subjective:  Patient ID: Samuel Mack, male    DOB: [birth year]  Age: 87 y.o. MRN: [id number]  CC:  Chief Complaint  Patient presents with  . Pacemaker Check    HPI  Samuel Mack presents for pacemaker check.  He denies any chest pain or shortness of breath.  He does not smoke does not drink. Past Medical History:  Diagnosis Date  . Asthma   . Cancer ([place])    skin  . Complication of anesthesia    previous pacemaker patient could feel everything  . Diabetes mellitus without complication (HCC)    diet controlled  . Dyspnea   . Dysrhythmia   . Elevated lipids   . Hypertension   . Hypothyroidism   . Myocardial infarction ([place])   . Presence of permanent cardiac pacemaker     Past Surgical History:  Procedure Laterality Date  . CATARACT EXTRACTION W/PHACO Left 12/11/2016   Procedure: CATARACT EXTRACTION PHACO AND INTRAOCULAR LENS PLACEMENT (IOC);  Surgeon: Leandrew Koyanagi, MD;  Location: [facility];  Service: Ophthalmology;  Laterality: Left;  IVA TOPICAL LEFT  . EYE SURGERY Left    cataract  . NASAL SINUS SURGERY    . PACEMAKER GENERATOR CHANGE Left    x2  . PACEMAKER INSERTION  2002  . PACEMAKER INSERTION Left 05/12/2019   Procedure: INSERTION PACEMAKER, LEFT;  Surgeon: Cletis Athens, MD;  Location: [facility];  Service: Cardiovascular;  Laterality: Left;  . PROSTATE SURGERY    . TRANSURETHRAL RESECTION OF PROSTATE N/A 06/20/2020   Procedure: TRANSURETHRAL RESECTION OF THE PROSTATE (TURP);  Surgeon: [name], MD;  Location: [facility];  Service: Urology;  Laterality: N/A;    History reviewed. No pertinent family history.  Social History   Socioeconomic History  . Marital status: Widowed    Spouse name: Not on file  . Number of children: Not on file  . Years of education: Not on file  . Highest education level: Not on file  Occupational History  . Not on file  Tobacco Use  . Smoking status: Former Smoker     Quit date: 01/26/1979    Years since quitting: 42.0  . Smokeless tobacco: Never Used  Vaping Use  . Vaping Use: Never used  Substance and Sexual Activity  . Alcohol use: No  . Drug use: No  . Sexual activity: Not on file  Other Topics Concern  . Not on file  Social History Narrative  . Not on file   Social Determinants of Health   Financial Resource Strain: Not on file  Food Insecurity: Not on file  Transportation Needs: Not on file  Physical Activity: Not on file  Stress: Not on file  Social Connections: Not on file  Intimate Partner Violence: Not on file     Current Outpatient Medications:  .  acetaminophen (TYLENOL 8 HOUR) 650 MG CR tablet, Take 1 tablet (650 mg total) by mouth every 8 (eight) hours as needed for pain., Disp: 2 tablet, Rfl: 1 .  aspirin 81 MG tablet, Take 81 mg by mouth daily., Disp: , Rfl:  .  atorvastatin (LIPITOR) 10 MG tablet, Take 10 mg by mouth at bedtime., Disp: , Rfl:  .  cholecalciferol (VITAMIN D) 25 MCG (1000 UNIT) tablet, Take 1,000 Units by mouth in the morning and at bedtime., Disp: , Rfl:  .  Coenzyme Q10 100 MG TABS, Take 100 mg by mouth daily. , Disp: , Rfl:  .  digoxin (LANOXIN) 0.125 MG tablet, Take 1 tablet (125 mcg total) by mouth daily. (Patient taking differently: Take 0.125 mg by mouth daily.), Disp: 90 tablet, Rfl: 3 .  diltiazem (DILACOR XR) 180 MG 24 hr capsule, Take 180 mg by mouth daily., Disp: , Rfl:  .  levocetirizine (XYZAL) 5 MG tablet, Take 5 mg by mouth every evening., Disp: , Rfl:  .  levothyroxine (SYNTHROID) 175 MCG tablet, Take 175 mcg by mouth daily before breakfast. , Disp: , Rfl:  .  losartan (COZAAR) 100 MG tablet, Take 100 mg by mouth daily., Disp: , Rfl:  .  Multiple Vitamins-Minerals (PRESERVISION AREDS PO), Take by mouth 2 (two) times daily., Disp: , Rfl:  .  nebivolol (BYSTOLIC) 10 MG tablet, Take 10 mg by mouth daily., Disp: , Rfl:  .  nitrofurantoin, macrocrystal-monohydrate, (MACROBID) 100 MG capsule, Take 1  capsule (100 mg total) by mouth 2 (two) times daily., Disp: 14 capsule, Rfl: 0 .  Omega-3 Fatty Acids (FISH OIL) 1200 MG CAPS, Take 1,200 mg by mouth 3 (three) times daily., Disp: , Rfl:  .  potassium gluconate 595 (99 K) MG TABS tablet, Take 595 mg by mouth daily., Disp: , Rfl:  .  spironolactone (ALDACTONE) 25 MG tablet, Take 25 mg by mouth daily., Disp: , Rfl:  .  vitamin B-12 (CYANOCOBALAMIN) 1000 MCG tablet, Take 1,000 mcg by mouth daily., Disp: , Rfl:  .  vitamin C (ASCORBIC ACID) 500 MG tablet, Take 500 mg by mouth daily., Disp: , Rfl:  .  vitamin E 180 MG (400 UNITS) capsule, Take 400 Units by mouth daily., Disp: , Rfl:    Allergies  Allergen Reactions  . Bee Venom Swelling  . Penicillins Swelling    Patient reports he swells and it was "something in the pcn" 50 year ago    ROS Review of Systems  Constitutional: Negative.   HENT: Negative.   Eyes: Negative.   Respiratory: Negative.   Cardiovascular: Negative.   Gastrointestinal: Negative.   Endocrine: Negative.   Genitourinary: Negative.   Musculoskeletal: Negative.   Skin: Negative.   Allergic/Immunologic: Negative.   Neurological: Negative.   Hematological: Negative.   Psychiatric/Behavioral: Negative.   All other systems reviewed and are negative.     Objective:    Physical Exam Vitals reviewed.  Constitutional:      Appearance: Normal appearance.  HENT:     Mouth/Throat:     Mouth: Mucous membranes are moist.  Eyes:     Pupils: Pupils are equal, round, and reactive to light.  Neck:     Vascular: No carotid bruit.  Cardiovascular:     Rate and Rhythm: Normal rate and regular rhythm.     Pulses: Normal pulses.     Heart sounds: Normal heart sounds.  Pulmonary:     Effort: Pulmonary effort is normal.     Breath sounds: Normal breath sounds.  Abdominal:     General: Bowel sounds are normal.     Palpations: Abdomen is soft. There is no hepatomegaly, splenomegaly or mass.     Tenderness: There is no  abdominal tenderness.     Hernia: No hernia is present.  Musculoskeletal:     Cervical back: Neck supple.     Right lower leg: No edema.     Left lower leg: No edema.  Skin:    Findings: No rash.  Neurological:     Mental Status: He is alert and oriented to person, place, and time.  Motor: No weakness.  Psychiatric:        Mood and Affect: Mood normal.        Behavior: Behavior normal.     BP 135/89   Pulse 82  Wt Readings from Last 3 Encounters:  10/25/20 230 lb (104.3 kg)  07/21/20 233 lb (105.7 kg)  06/23/20 233 lb (105.7 kg)     Health Maintenance Due  Topic Date Due  . TETANUS/TDAP  Never done  . PNA vac Low Risk Adult (1 of 2 - PCV13) Never done  . COVID-19 Vaccine (3 - Booster for Pfizer series) 03/16/2020    There are no preventive care reminders to display for this patient.  No results found for: TSH Lab Results  Component Value Date   WBC 13.6 (H) 06/16/2020   HGB 13.3 06/16/2020   HCT 39.7 06/16/2020   MCV 92.1 06/16/2020   PLT 204 06/16/2020   Lab Results  Component Value Date   NA 132 (L) 06/16/2020   K 4.3 06/16/2020   CO2 22 06/16/2020   GLUCOSE 100 (H) 06/16/2020   BUN 23 06/16/2020   CREATININE 1.51 (H) 06/16/2020   BILITOT 0.7 05/10/2019   ALKPHOS 69 05/10/2019   AST 23 05/10/2019   ALT 22 05/10/2019   PROT 6.9 05/10/2019   ALBUMIN 3.8 05/10/2019   CALCIUM 9.3 06/16/2020   ANIONGAP 10 06/16/2020   No results found for: CHOL No results found for: HDL No results found for: LDLCALC No results found for: TRIG No results found for: CHOLHDL No results found for: HGBA1C    Assessment & Plan:   Problem List Items Addressed This Visit      Cardiovascular and Mediastinum   Sick sinus syndrome [facility])    Patient pacemaker was checked and was found to be functioning well.      Essential hypertension    Blood pressure stable on the present medication.        Respiratory   Seasonal allergic rhinitis due to pollen    Advised to  take Claritin 10 mg p.o. daily.        Other   Cardiac pacemaker in situ - Primary    Cardiac pacemaker is working well it was interrogated with pacemaker system analyzer on today's visit      Relevant Orders   Nettie (Completed)    Note: Medical Device Follow-up  Patient pacemaker was interrogated by pacemakers analyzer, battery status is okay.  No programming changes were indicated after the review of the data.  Histogram shows no change since the last interrogation Atrial and ventricular sensing thresholds were found to be acceptable Impedance was checked and it was found to be normal.  Thresholds were found to be okay on evaluation of rhythm r yroblem.  No high rate or low rate arrhythmia were noted.  Estimated battery longevity is .8.6 yr I have personally reviewed the device data and amended the report as necessary.   Inte  Alexia Dinger   No orders of the defined types were placed in this encounter.   Follow-up: No follow-ups on file.    Cletis Athens, MD

## 2021-01-27 NOTE — Assessment & Plan Note (Signed)
Cardiac pacemaker is working well it was interrogated with pacemaker system analyzer on today's visit

## 2021-01-27 NOTE — Assessment & Plan Note (Signed)
Advised to take Claritin 10 mg p.o. daily 

## 2021-03-25 ENCOUNTER — Other Ambulatory Visit: Payer: Self-pay | Admitting: Internal Medicine

## 2021-04-23 ENCOUNTER — Ambulatory Visit (INDEPENDENT_AMBULATORY_CARE_PROVIDER_SITE_OTHER): Payer: MEDICARE | Admitting: Internal Medicine

## 2021-04-23 ENCOUNTER — Other Ambulatory Visit: Payer: Self-pay

## 2021-04-23 VITALS — BP 151/77 | HR 86

## 2021-04-23 DIAGNOSIS — J301 Allergic rhinitis due to pollen: Secondary | ICD-10-CM | POA: Diagnosis not present

## 2021-04-23 DIAGNOSIS — I495 Sick sinus syndrome: Secondary | ICD-10-CM

## 2021-04-23 DIAGNOSIS — I10 Essential (primary) hypertension: Secondary | ICD-10-CM

## 2021-04-23 DIAGNOSIS — Z950 Presence of cardiac pacemaker: Secondary | ICD-10-CM | POA: Diagnosis not present

## 2021-04-26 NOTE — Assessment & Plan Note (Signed)
Take Claritin 5 mg p.o. daily as needed

## 2021-04-26 NOTE — Progress Notes (Addendum)
Established Patient Office Visit  Subjective:  Patient ID: Samuel Mack, male    DOB: 31-Dec-1929  Age: 87 y.o. MRN: [id number]  CC:  Chief Complaint  Patient presents with   Pacemaker Check    HPI  [name] presents for pacemaker check.  He denies any chest pain or shortness of breath.  He does not smoke does not drink.  Patient sees Dr. Rosario Jacks on a regular basis for the checkup on diabetes Past Medical History:  Diagnosis Date   Asthma    Cancer ([place])    skin   Complication of anesthesia    previous pacemaker patient could feel everything   Diabetes mellitus without complication ([place])    diet controlled   Dyspnea    Dysrhythmia    Elevated lipids    Hypertension    Hypothyroidism    Myocardial infarction [facility])    Presence of permanent cardiac pacemaker     Past Surgical History:  Procedure Laterality Date   CATARACT EXTRACTION W/PHACO Left 12/11/2016   Procedure: CATARACT EXTRACTION PHACO AND INTRAOCULAR LENS PLACEMENT ([place]);  Surgeon: Leandrew Koyanagi, MD;  Location: [place];  Service: Ophthalmology;  Laterality: Left;  IVA TOPICAL LEFT   EYE SURGERY Left    cataract   NASAL SINUS SURGERY     PACEMAKER GENERATOR CHANGE Left    x2   PACEMAKER INSERTION  2002   PACEMAKER INSERTION Left 05/12/2019   Procedure: INSERTION PACEMAKER, LEFT;  Surgeon: Cletis Athens, MD;  Location: [facility];  Service: Cardiovascular;  Laterality: Left;   PROSTATE SURGERY     TRANSURETHRAL RESECTION OF PROSTATE N/A 06/20/2020   Procedure: TRANSURETHRAL RESECTION OF THE PROSTATE (TURP);  Surgeon: [name], MD;  Location: [facility];  Service: Urology;  Laterality: N/A;    No family history on file.  Social History   Socioeconomic History   Marital status: Widowed    Spouse name: Not on file   Number of children: Not on file   Years of education: Not on file   Highest education level: Not on file  Occupational History   Not on file  Tobacco Use   Smoking  status: Former    Types: Cigarettes    Quit date: 01/26/1979    Years since quitting: 42.2   Smokeless tobacco: Never  Vaping Use   Vaping Use: Never used  Substance and Sexual Activity   Alcohol use: No   Drug use: No   Sexual activity: Not on file  Other Topics Concern   Not on file  Social History Narrative   Not on file   Social Determinants of Health   Financial Resource Strain: Not on file  Food Insecurity: Not on file  Transportation Needs: Not on file  Physical Activity: Not on file  Stress: Not on file  Social Connections: Not on file  Intimate Partner Violence: Not on file     Current Outpatient Medications:    acetaminophen (TYLENOL 8 HOUR) 650 MG CR tablet, Take 1 tablet (650 mg total) by mouth every 8 (eight) hours as needed for pain., Disp: 2 tablet, Rfl: 1   aspirin 81 MG tablet, Take 81 mg by mouth daily., Disp: , Rfl:    atorvastatin (LIPITOR) 10 MG tablet, Take 10 mg by mouth at bedtime., Disp: , Rfl:    cholecalciferol (VITAMIN D) 25 MCG (1000 UNIT) tablet, Take 1,000 Units by mouth in the morning and at bedtime., Disp: , Rfl:    Coenzyme Q10  100 MG TABS, Take 100 mg by mouth daily. , Disp: , Rfl:    digoxin (LANOXIN) 0.125 MG tablet, TAKE 1 TABLET BY MOUTH EVERY DAY, Disp: 90 tablet, Rfl: 3   diltiazem (DILACOR XR) 180 MG 24 hr capsule, Take 180 mg by mouth daily., Disp: , Rfl:    levocetirizine (XYZAL) 5 MG tablet, Take 5 mg by mouth every evening., Disp: , Rfl:    levothyroxine (SYNTHROID) 175 MCG tablet, Take 175 mcg by mouth daily before breakfast. , Disp: , Rfl:    losartan (COZAAR) 100 MG tablet, Take 100 mg by mouth daily., Disp: , Rfl:    Multiple Vitamins-Minerals (PRESERVISION AREDS PO), Take by mouth 2 (two) times daily., Disp: , Rfl:    nebivolol (BYSTOLIC) 10 MG tablet, Take 10 mg by mouth daily., Disp: , Rfl:    nitrofurantoin, macrocrystal-monohydrate, (MACROBID) 100 MG capsule, Take 1 capsule (100 mg total) by mouth 2 (two) times daily.,  Disp: 14 capsule, Rfl: 0   Omega-3 Fatty Acids (FISH OIL) 1200 MG CAPS, Take 1,200 mg by mouth 3 (three) times daily., Disp: , Rfl:    potassium gluconate 595 (99 K) MG TABS tablet, Take 595 mg by mouth daily., Disp: , Rfl:    spironolactone (ALDACTONE) 25 MG tablet, Take 25 mg by mouth daily., Disp: , Rfl:    vitamin B-12 (CYANOCOBALAMIN) 1000 MCG tablet, Take 1,000 mcg by mouth daily., Disp: , Rfl:    vitamin C (ASCORBIC ACID) 500 MG tablet, Take 500 mg by mouth daily., Disp: , Rfl:    vitamin E 180 MG (400 UNITS) capsule, Take 400 Units by mouth daily., Disp: , Rfl:    Allergies  Allergen Reactions   Bee Venom Swelling   Penicillins Swelling    Patient reports he swells and it was "something in the pcn" 50 year ago    ROS Review of Systems  Constitutional: Negative.   HENT: Negative.    Eyes: Negative.   Respiratory:  Positive for shortness of breath (Shortness of breath with minimal exertion). Negative for cough and chest tightness.   Cardiovascular: Negative.   Gastrointestinal: Negative.   Endocrine: Negative.   Genitourinary: Negative.   Musculoskeletal: Negative.   Skin: Negative.   Allergic/Immunologic: Negative.   Neurological: Negative.   Hematological: Negative.   Psychiatric/Behavioral: Negative.    All other systems reviewed and are negative.    Objective:    Physical Exam Vitals reviewed.  Constitutional:      Appearance: Normal appearance. He is obese.  HENT:     Mouth/Throat:     Mouth: Mucous membranes are moist.  Eyes:     Pupils: Pupils are equal, round, and reactive to light.  Neck:     Vascular: No carotid bruit.  Cardiovascular:     Rate and Rhythm: Normal rate and regular rhythm.     Pulses: Normal pulses.     Heart sounds: Normal heart sounds.  Pulmonary:     Effort: Pulmonary effort is normal.     Breath sounds: Normal breath sounds.  Abdominal:     General: Bowel sounds are normal.     Palpations: Abdomen is soft. There is no  hepatomegaly, splenomegaly or mass.     Tenderness: There is no abdominal tenderness.     Hernia: No hernia is present.  Musculoskeletal:     Cervical back: Neck supple.     Right lower leg: No edema.     Left lower leg: No edema.  Skin:  Findings: No rash.  Neurological:     Mental Status: He is alert and oriented to person, place, and time.     Motor: No weakness.  Psychiatric:        Mood and Affect: Mood normal.        Behavior: Behavior normal.    BP (!) 151/77   Pulse 86  Wt Readings from Last 3 Encounters:  10/25/20 230 lb (104.3 kg)  07/21/20 233 lb (105.7 kg)  06/23/20 233 lb (105.7 kg)     Health Maintenance Due  Topic Date Due   TETANUS/TDAP  Never done   Zoster Vaccines- Shingrix (1 of 2) Never done   PNA vac Low Risk Adult (1 of 2 - PCV13) Never done   COVID-19 Vaccine (4 - Booster for Pfizer series) 11/23/2020   INFLUENZA VACCINE  04/16/2021    There are no preventive care reminders to display for this patient.  No results found for: TSH Lab Results  Component Value Date   WBC 13.6 (H) 06/16/2020   HGB 13.3 06/16/2020   HCT 39.7 06/16/2020   MCV 92.1 06/16/2020   PLT 204 06/16/2020   Lab Results  Component Value Date   NA 132 (L) 06/16/2020   K 4.3 06/16/2020   CO2 22 06/16/2020   GLUCOSE 100 (H) 06/16/2020   BUN 23 06/16/2020   CREATININE 1.51 (H) 06/16/2020   BILITOT 0.7 05/10/2019   ALKPHOS 69 05/10/2019   AST 23 05/10/2019   ALT 22 05/10/2019   PROT 6.9 05/10/2019   ALBUMIN 3.8 05/10/2019   CALCIUM 9.3 06/16/2020   ANIONGAP 10 06/16/2020   No results found for: CHOL No results found for: HDL No results found for: LDLCALC No results found for: TRIG No results found for: CHOLHDL No results found for: HGBA1C    Assessment & Plan:   Problem List Items Addressed This Visit       Cardiovascular and Mediastinum   Sick sinus syndrome [facility])    Patient does not have any arrhythmia pacemaker is functioning well.  He was advised to  lose weight.      Essential hypertension         ideally I want to keep systolic blood pressure below 130 mmHg, patient was asked to check blood pressure one times a week and give me a report on that.  Patient will be follow-up in 3 months  or earlier as needed, patient will call me back for any change in the cardiovascular syadvised to buy a book from local bookstore concerning blood pressure and read several chapters  every day.  This will be supplemented by some of the material we will give him from the office.  Patient should also utilize other resources like YouTube and Internet to learn more about the blood pressure and the diet.[place] Patient was         Respiratory   Seasonal allergic rhinitis due to pollen    Take Claritin 5 mg p.o. daily as needed        Other   Cardiac pacemaker in situ - Primary    Pacemaker battery life is 8.4 years      Relevant Orders   PACEMAKER IN CLINIC CHECK  Note: Medical Device Follow-up  Patient pacemaker was interrogated by pacemakers analyzer, battery status is okay.  No programming changes were indicated after the review of the data.  Histogram shows no change since the last interrogation Atrial and ventricular sensing thresholds were found to be acceptable  Impedance was checked and it was found to be normal.  Thresholds were found to be okay on evaluation of rhythm short runs of ventricular tachycardia were noted on 7 ,12/2020 heart rate more than 190  Estimated battery longevity is .8.79yrI have personally reviewed the device data and amended the report as necessary.     Kensy Blizard   No orders of the defined types were placed in this encounter.   Follow-up: No follow-ups on file.    JCletis Athens MD

## 2021-04-26 NOTE — Assessment & Plan Note (Signed)
Patient does not have any arrhythmia pacemaker is functioning well.  He was advised to lose weight.

## 2021-04-26 NOTE — Assessment & Plan Note (Signed)
     ideally I want to keep systolic blood pressure below 130 mmHg, patient was asked to check blood pressure one times a week and give me a report on that.  Patient will be follow-up in 3 months  or earlier as needed, patient will call me back for any change in the cardiovascular syadvised to buy a book from local bookstore concerning blood pressure and read several chapters  every day.  This will be supplemented by some of the material we will give him from the office.  Patient should also utilize other resources like YouTube and Internet to learn more about the blood pressure and the diet.[place] Patient was

## 2021-04-26 NOTE — Assessment & Plan Note (Signed)
Pacemaker battery life is 8.4 years

## 2021-07-23 ENCOUNTER — Ambulatory Visit (INDEPENDENT_AMBULATORY_CARE_PROVIDER_SITE_OTHER): Payer: MEDICARE | Admitting: Internal Medicine

## 2021-07-23 ENCOUNTER — Other Ambulatory Visit: Payer: Self-pay

## 2021-07-23 VITALS — BP 147/68 | HR 90 | Ht 68.0 in | Wt 236.6 lb

## 2021-07-23 DIAGNOSIS — J301 Allergic rhinitis due to pollen: Secondary | ICD-10-CM

## 2021-07-23 DIAGNOSIS — Z950 Presence of cardiac pacemaker: Secondary | ICD-10-CM

## 2021-07-23 DIAGNOSIS — I10 Essential (primary) hypertension: Secondary | ICD-10-CM

## 2021-07-23 DIAGNOSIS — E669 Obesity, unspecified: Secondary | ICD-10-CM | POA: Diagnosis not present

## 2021-07-23 DIAGNOSIS — I495 Sick sinus syndrome: Secondary | ICD-10-CM

## 2021-07-29 ENCOUNTER — Encounter: Payer: Self-pay | Admitting: Internal Medicine

## 2021-07-29 NOTE — Progress Notes (Signed)
Established Patient Office Visit  Subjective:  Patient ID: Samuel Mack, male    DOB: [birth year]  Age: 87 y.o. MRN: [id number]  CC: No chief complaint on file.   HPI  Samuel Mack presents for pacemaker check and office visit  Past Medical History:  Diagnosis Date   Asthma    Cancer ([place])    skin   Complication of anesthesia    previous pacemaker patient could feel everything   Diabetes mellitus without complication ([place])    diet controlled   Dyspnea    Dysrhythmia    Elevated lipids    Hypertension    Hypothyroidism    Myocardial infarction [facility])    Presence of permanent cardiac pacemaker     Past Surgical History:  Procedure Laterality Date   CATARACT EXTRACTION W/PHACO Left 12/11/2016   Procedure: CATARACT EXTRACTION PHACO AND INTRAOCULAR LENS PLACEMENT ([place]);  Surgeon: Leandrew Koyanagi, MD;  Location: [place];  Service: Ophthalmology;  Laterality: Left;  IVA TOPICAL LEFT   EYE SURGERY Left    cataract   NASAL SINUS SURGERY     PACEMAKER GENERATOR CHANGE Left    x2   PACEMAKER INSERTION  2002   PACEMAKER INSERTION Left 05/12/2019   Procedure: INSERTION PACEMAKER, LEFT;  Surgeon: Cletis Athens, MD;  Location: [facility];  Service: Cardiovascular;  Laterality: Left;   PROSTATE SURGERY     TRANSURETHRAL RESECTION OF PROSTATE N/A 06/20/2020   Procedure: TRANSURETHRAL RESECTION OF THE PROSTATE (TURP);  Surgeon: [name], MD;  Location: [facility];  Service: Urology;  Laterality: N/A;    History reviewed. No pertinent family history.  Social History   Socioeconomic History   Marital status: Widowed    Spouse name: Not on file   Number of children: Not on file   Years of education: Not on file   Highest education level: Not on file  Occupational History   Not on file  Tobacco Use   Smoking status: Former    Types: Cigarettes    Quit date: 01/26/1979    Years since quitting: 42.5   Smokeless tobacco: Never  Vaping Use   Vaping Use:  Never used  Substance and Sexual Activity   Alcohol use: No   Drug use: No   Sexual activity: Not on file  Other Topics Concern   Not on file  Social History Narrative   Not on file   Social Determinants of Health   Financial Resource Strain: Not on file  Food Insecurity: Not on file  Transportation Needs: Not on file  Physical Activity: Not on file  Stress: Not on file  Social Connections: Not on file  Intimate Partner Violence: Not on file     Current Outpatient Medications:    acetaminophen (TYLENOL 8 HOUR) 650 MG CR tablet, Take 1 tablet (650 mg total) by mouth every 8 (eight) hours as needed for pain., Disp: 2 tablet, Rfl: 1   aspirin 81 MG tablet, Take 81 mg by mouth daily., Disp: , Rfl:    atorvastatin (LIPITOR) 10 MG tablet, Take 10 mg by mouth at bedtime., Disp: , Rfl:    cholecalciferol (VITAMIN D) 25 MCG (1000 UNIT) tablet, Take 1,000 Units by mouth in the morning and at bedtime., Disp: , Rfl:    Coenzyme Q10 100 MG TABS, Take 100 mg by mouth daily. , Disp: , Rfl:    digoxin (LANOXIN) 0.125 MG tablet, TAKE 1 TABLET BY MOUTH EVERY DAY, Disp: 90 tablet, Rfl: 3  diltiazem (DILACOR XR) 180 MG 24 hr capsule, Take 180 mg by mouth daily., Disp: , Rfl:    levocetirizine (XYZAL) 5 MG tablet, Take 5 mg by mouth every evening., Disp: , Rfl:    levothyroxine (SYNTHROID) 175 MCG tablet, Take 175 mcg by mouth daily before breakfast. , Disp: , Rfl:    losartan (COZAAR) 100 MG tablet, Take 100 mg by mouth daily., Disp: , Rfl:    Multiple Vitamins-Minerals (PRESERVISION AREDS PO), Take by mouth 2 (two) times daily., Disp: , Rfl:    nebivolol (BYSTOLIC) 10 MG tablet, Take 10 mg by mouth daily., Disp: , Rfl:    nitrofurantoin, macrocrystal-monohydrate, (MACROBID) 100 MG capsule, Take 1 capsule (100 mg total) by mouth 2 (two) times daily., Disp: 14 capsule, Rfl: 0   Omega-3 Fatty Acids (FISH OIL) 1200 MG CAPS, Take 1,200 mg by mouth 3 (three) times daily., Disp: , Rfl:    potassium  gluconate 595 (99 K) MG TABS tablet, Take 595 mg by mouth daily., Disp: , Rfl:    spironolactone (ALDACTONE) 25 MG tablet, Take 25 mg by mouth daily., Disp: , Rfl:    vitamin B-12 (CYANOCOBALAMIN) 1000 MCG tablet, Take 1,000 mcg by mouth daily., Disp: , Rfl:    vitamin C (ASCORBIC ACID) 500 MG tablet, Take 500 mg by mouth daily., Disp: , Rfl:    vitamin E 180 MG (400 UNITS) capsule, Take 400 Units by mouth daily., Disp: , Rfl:    Allergies  Allergen Reactions   Bee Venom Swelling   Penicillins Swelling    Patient reports he swells and it was "something in the pcn" 50 year ago    ROS Review of Systems  Constitutional: Negative.   HENT: Negative.    Eyes: Negative.   Respiratory: Negative.    Cardiovascular: Negative.   Gastrointestinal: Negative.   Endocrine: Negative.   Genitourinary: Negative.   Musculoskeletal: Negative.   Skin: Negative.   Allergic/Immunologic: Negative.   Neurological: Negative.   Hematological: Negative.   Psychiatric/Behavioral: Negative.    All other systems reviewed and are negative.    Objective:    Physical Exam Vitals reviewed.  Constitutional:      Appearance: Normal appearance.  HENT:     Mouth/Throat:     Mouth: Mucous membranes are moist.  Eyes:     Pupils: Pupils are equal, round, and reactive to light.  Neck:     Vascular: No carotid bruit.  Cardiovascular:     Rate and Rhythm: Normal rate and regular rhythm.     Pulses: Normal pulses.     Heart sounds: Normal heart sounds.  Pulmonary:     Effort: Pulmonary effort is normal.     Breath sounds: Normal breath sounds.  Abdominal:     General: Bowel sounds are normal.     Palpations: Abdomen is soft. There is no hepatomegaly, splenomegaly or mass.     Tenderness: There is no abdominal tenderness.     Hernia: No hernia is present.  Musculoskeletal:     Cervical back: Neck supple.     Right lower leg: No edema.     Left lower leg: No edema.  Skin:    Findings: No rash.   Neurological:     Mental Status: He is alert and oriented to person, place, and time.     Motor: No weakness.  Psychiatric:        Mood and Affect: Mood normal.        Behavior: Behavior normal.  BP (!) 147/68   Pulse 90   Ht 5\' 8"  (1.727 m)   Wt 236 lb 9.6 oz (107.3 kg)   BMI 35.97 kg/m  Wt Readings from Last 3 Encounters:  07/23/21 236 lb 9.6 oz (107.3 kg)  10/25/20 230 lb (104.3 kg)  07/21/20 233 lb (105.7 kg)     Health Maintenance Due  Topic Date Due   TETANUS/TDAP  Never done   Zoster Vaccines- Shingrix (1 of 2) Never done   Pneumonia Vaccine 3+ Years old (1 - PCV) Never done   COVID-19 Vaccine (4 - Booster for Pfizer series) 09/20/2020   INFLUENZA VACCINE  04/16/2021    There are no preventive care reminders to display for this patient.  No results found for: TSH Lab Results  Component Value Date   WBC 13.6 (H) 06/16/2020   HGB 13.3 06/16/2020   HCT 39.7 06/16/2020   MCV 92.1 06/16/2020   PLT 204 06/16/2020   Lab Results  Component Value Date   NA 132 (L) 06/16/2020   K 4.3 06/16/2020   CO2 22 06/16/2020   GLUCOSE 100 (H) 06/16/2020   BUN 23 06/16/2020   CREATININE 1.51 (H) 06/16/2020   BILITOT 0.7 05/10/2019   ALKPHOS 69 05/10/2019   AST 23 05/10/2019   ALT 22 05/10/2019   PROT 6.9 05/10/2019   ALBUMIN 3.8 05/10/2019   CALCIUM 9.3 06/16/2020   ANIONGAP 10 06/16/2020   No results found for: CHOL No results found for: HDL No results found for: LDLCALC No results found for: TRIG No results found for: CHOLHDL No results found for: HGBA1C    Assessment & Plan:  Note: Medical Device Follow-up  Patient pacemaker was interrogated by pacemakers analyzer, battery status is okay.  No programming changes were indicated after the review of the data.  Histogram shows no change since the last interrogation Atrial and ventricular sensing thresholds were found to be acceptable Impedance was checked and it was found to be normal.  Thresholds were  found to be okay on evaluation of rhythm problem.  No high rate or low rate arrhythmia were noted.  Estimated battery longevity is 8.1 years I have personally reviewed the device data and amended the report as necessary.   Problem List Items Addressed This Visit       Cardiovascular and Mediastinum   Sick sinus syndrome ([place]) - Primary    Patient does not complain of a tachyarrhythmia or dizziness      Essential hypertension    Blood pressure is under control.        Respiratory   Seasonal allergic rhinitis due to pollen    Take Claritin 5 mg p.o. daily        Other   Cardiac pacemaker in situ   Relevant Orders   PACEMAKER IN CLINIC CHECK   Obesity (BMI 30-39.9)    Behavioral modification strategies: increasing lean protein intake, decreasing simple carbohydrates, increasing vegetables, increasing water intake, decreasing eating out, no skipping meals, meal planning and cooking strategies, keeping healthy foods in the home and planning for success.       No orders of the defined types were placed in this encounter.   Follow-up: No follow-ups on file.    Cletis Athens, MD

## 2021-07-29 NOTE — Assessment & Plan Note (Signed)
Take Claritin 5 mg p.o. daily 

## 2021-07-29 NOTE — Assessment & Plan Note (Signed)
Blood pressure is under control 

## 2021-07-29 NOTE — Assessment & Plan Note (Signed)
Behavioral modification strategies: increasing lean protein intake, decreasing simple carbohydrates, increasing vegetables, increasing water intake, decreasing eating out, no skipping meals, meal planning and cooking strategies, keeping healthy foods in the home and planning for success. 

## 2021-07-29 NOTE — Assessment & Plan Note (Signed)
Patient does not complain of a tachyarrhythmia or dizziness

## 2021-10-22 ENCOUNTER — Ambulatory Visit (INDEPENDENT_AMBULATORY_CARE_PROVIDER_SITE_OTHER): Payer: MEDICARE | Admitting: Internal Medicine

## 2021-10-22 ENCOUNTER — Other Ambulatory Visit: Payer: Self-pay

## 2021-10-22 VITALS — BP 140/66 | HR 88 | Ht 68.0 in | Wt 236.0 lb

## 2021-10-22 DIAGNOSIS — I10 Essential (primary) hypertension: Secondary | ICD-10-CM | POA: Diagnosis not present

## 2021-10-22 DIAGNOSIS — J301 Allergic rhinitis due to pollen: Secondary | ICD-10-CM

## 2021-10-22 DIAGNOSIS — Z950 Presence of cardiac pacemaker: Secondary | ICD-10-CM | POA: Diagnosis not present

## 2021-10-22 DIAGNOSIS — I495 Sick sinus syndrome: Secondary | ICD-10-CM

## 2021-10-22 NOTE — Progress Notes (Signed)
Established Patient Office Visit  Subjective:  Patient ID: Samuel Mack, male    DOB: 1930-05-02  Age: 88 y.o. MRN: [id number]  CC:  Chief Complaint  Patient presents with   Pacemaker Check    HPI  KOOPER GODSHALL presents for pacer check pt has diabete  Past Medical History:  Diagnosis Date   Asthma    Cancer ([place])    skin   Complication of anesthesia    previous pacemaker patient could feel everything   Diabetes mellitus without complication ([place])    diet controlled   Dyspnea    Dysrhythmia    Elevated lipids    Hypertension    Hypothyroidism    Myocardial infarction [facility])    Presence of permanent cardiac pacemaker     Past Surgical History:  Procedure Laterality Date   CATARACT EXTRACTION W/PHACO Left 12/11/2016   Procedure: CATARACT EXTRACTION PHACO AND INTRAOCULAR LENS PLACEMENT ([place]);  Surgeon: Leandrew Koyanagi, MD;  Location: [place];  Service: Ophthalmology;  Laterality: Left;  IVA TOPICAL LEFT   EYE SURGERY Left    cataract   NASAL SINUS SURGERY     PACEMAKER GENERATOR CHANGE Left    x2   PACEMAKER INSERTION  2002   PACEMAKER INSERTION Left 05/12/2019   Procedure: INSERTION PACEMAKER, LEFT;  Surgeon: Cletis Athens, MD;  Location: [facility];  Service: Cardiovascular;  Laterality: Left;   PROSTATE SURGERY     TRANSURETHRAL RESECTION OF PROSTATE N/A 06/20/2020   Procedure: TRANSURETHRAL RESECTION OF THE PROSTATE (TURP);  Surgeon: [name], MD;  Location: [facility];  Service: Urology;  Laterality: N/A;    History reviewed. No pertinent family history.  Social History   Socioeconomic History   Marital status: Widowed    Spouse name: Not on file   Number of children: Not on file   Years of education: Not on file   Highest education level: Not on file  Occupational History   Not on file  Tobacco Use   Smoking status: Former    Types: Cigarettes    Quit date: 01/26/1979    Years since quitting: 42.7   Smokeless tobacco: Never   Vaping Use   Vaping Use: Never used  Substance and Sexual Activity   Alcohol use: No   Drug use: No   Sexual activity: Not on file  Other Topics Concern   Not on file  Social History Narrative   Not on file   Social Determinants of Health   Financial Resource Strain: Not on file  Food Insecurity: Not on file  Transportation Needs: Not on file  Physical Activity: Not on file  Stress: Not on file  Social Connections: Not on file  Intimate Partner Violence: Not on file     Current Outpatient Medications:    acetaminophen (TYLENOL 8 HOUR) 650 MG CR tablet, Take 1 tablet (650 mg total) by mouth every 8 (eight) hours as needed for pain., Disp: 2 tablet, Rfl: 1   aspirin 81 MG tablet, Take 81 mg by mouth daily., Disp: , Rfl:    atorvastatin (LIPITOR) 10 MG tablet, Take 10 mg by mouth at bedtime., Disp: , Rfl:    cholecalciferol (VITAMIN D) 25 MCG (1000 UNIT) tablet, Take 1,000 Units by mouth in the morning and at bedtime., Disp: , Rfl:    Coenzyme Q10 100 MG TABS, Take 100 mg by mouth daily. , Disp: , Rfl:    digoxin (LANOXIN) 0.125 MG tablet, TAKE 1 TABLET BY MOUTH EVERY  DAY, Disp: 90 tablet, Rfl: 3   diltiazem (DILACOR XR) 180 MG 24 hr capsule, Take 180 mg by mouth daily., Disp: , Rfl:    levocetirizine (XYZAL) 5 MG tablet, Take 5 mg by mouth every evening., Disp: , Rfl:    levothyroxine (SYNTHROID) 175 MCG tablet, Take 175 mcg by mouth daily before breakfast. , Disp: , Rfl:    losartan (COZAAR) 100 MG tablet, Take 100 mg by mouth daily., Disp: , Rfl:    Multiple Vitamins-Minerals (PRESERVISION AREDS PO), Take by mouth 2 (two) times daily., Disp: , Rfl:    nebivolol (BYSTOLIC) 10 MG tablet, Take 10 mg by mouth daily., Disp: , Rfl:    nitrofurantoin, macrocrystal-monohydrate, (MACROBID) 100 MG capsule, Take 1 capsule (100 mg total) by mouth 2 (two) times daily., Disp: 14 capsule, Rfl: 0   Omega-3 Fatty Acids (FISH OIL) 1200 MG CAPS, Take 1,200 mg by mouth 3 (three) times daily.,  Disp: , Rfl:    potassium gluconate 595 (99 K) MG TABS tablet, Take 595 mg by mouth daily., Disp: , Rfl:    spironolactone (ALDACTONE) 25 MG tablet, Take 25 mg by mouth daily., Disp: , Rfl:    vitamin B-12 (CYANOCOBALAMIN) 1000 MCG tablet, Take 1,000 mcg by mouth daily., Disp: , Rfl:    vitamin C (ASCORBIC ACID) 500 MG tablet, Take 500 mg by mouth daily., Disp: , Rfl:    vitamin E 180 MG (400 UNITS) capsule, Take 400 Units by mouth daily., Disp: , Rfl:    Allergies  Allergen Reactions   Bee Venom Swelling   Penicillins Swelling    Patient reports he swells and it was "something in the pcn" 50 year ago    ROS Review of Systems  Constitutional: Negative.   HENT: Negative.    Eyes: Negative.   Respiratory: Negative.    Cardiovascular: Negative.   Gastrointestinal: Negative.   Endocrine: Negative.   Genitourinary: Negative.   Musculoskeletal: Negative.   Skin: Negative.   Allergic/Immunologic: Negative.   Neurological: Negative.   Hematological: Negative.   Psychiatric/Behavioral: Negative.    All other systems reviewed and are negative.    Objective:    Physical Exam Vitals reviewed.  Constitutional:      Appearance: Normal appearance.  HENT:     Mouth/Throat:     Mouth: Mucous membranes are moist.  Eyes:     Pupils: Pupils are equal, round, and reactive to light.  Neck:     Vascular: No carotid bruit.  Cardiovascular:     Rate and Rhythm: Normal rate and regular rhythm.     Pulses: Normal pulses.     Heart sounds: Normal heart sounds.  Pulmonary:     Effort: Pulmonary effort is normal.     Breath sounds: Normal breath sounds.  Abdominal:     General: Bowel sounds are normal.     Palpations: Abdomen is soft. There is no hepatomegaly, splenomegaly or mass.     Tenderness: There is no abdominal tenderness.     Hernia: No hernia is present.  Musculoskeletal:     Cervical back: Neck supple.     Right lower leg: No edema.     Left lower leg: No edema.  Skin:     Findings: No rash.  Neurological:     Mental Status: He is alert and oriented to person, place, and time.     Motor: No weakness.  Psychiatric:        Mood and Affect: Mood normal.  Behavior: Behavior normal.    BP 140/66    Pulse 88    Ht 5\' 8"  (1.727 m)    Wt 236 lb (107 kg)    BMI 35.88 kg/m  Wt Readings from Last 3 Encounters:  10/22/21 236 lb (107 kg)  07/23/21 236 lb 9.6 oz (107.3 kg)  10/25/20 230 lb (104.3 kg)     Health Maintenance Due  Topic Date Due   TETANUS/TDAP  Never done   Zoster Vaccines- Shingrix (1 of 2) Never done   Pneumonia Vaccine 64+ Years old (1 - PCV) Never done   COVID-19 Vaccine (4 - Booster for Pfizer series) 09/20/2020   INFLUENZA VACCINE  04/16/2021    There are no preventive care reminders to display for this patient.  No results found for: TSH Lab Results  Component Value Date   WBC 13.6 (H) 06/16/2020   HGB 13.3 06/16/2020   HCT 39.7 06/16/2020   MCV 92.1 06/16/2020   PLT 204 06/16/2020   Lab Results  Component Value Date   NA 132 (L) 06/16/2020   K 4.3 06/16/2020   CO2 22 06/16/2020   GLUCOSE 100 (H) 06/16/2020   BUN 23 06/16/2020   CREATININE 1.51 (H) 06/16/2020   BILITOT 0.7 05/10/2019   ALKPHOS 69 05/10/2019   AST 23 05/10/2019   ALT 22 05/10/2019   PROT 6.9 05/10/2019   ALBUMIN 3.8 05/10/2019   CALCIUM 9.3 06/16/2020   ANIONGAP 10 06/16/2020   No results found for: CHOL No results found for: HDL No results found for: LDLCALC No results found for: TRIG No results found for: CHOLHDL No results found for: HGBA1C    Assessment & Plan:   Problem List Items Addressed This Visit       Cardiovascular and Mediastinum   Sick sinus syndrome ([place])    There is no syncope or arrhythmia      Essential hypertension     Patient denies any chest pain or shortness of breath there is no history of palpitation or paroxysmal nocturnal dyspnea   patient was advised to follow low-salt low-cholesterol diet    ideally I  want to keep systolic blood pressure below 130 mmHg, patient was asked to check blood pressure one times a week and give me a report on that.  Patient will be follow-up in 3 months  or earlier as needed, patient will call me back for any change in the cardiovascular symptoms Patient was advised to buy a book from local bookstore concerning blood pressure and read several chapters  every day.  This will be supplemented by some of the material we will give him from the office.  Patient should also utilize other resources like YouTube and Internet to learn more about the blood pressure and the diet.        Respiratory   Seasonal allergic rhinitis due to pollen    Take Claritin 5 mg p.o. daily        Other   Cardiac pacemaker in situ - Primary    Pacemaker is functioning well.      Relevant Orders   PACEMAKER IN CLINIC CHECK  Note: Medical Device Follow-up  Patient pacemaker was interrogated by pacemakers analyzer, battery status is okay.  No programming changes were indicated after the review of the data.  Histogram shows no change since the last interrogation Atrial and ventricular sensing thresholds were found to be acceptable Impedance was checked and it was found to be normal.  Thresholds were found  to be okay on evaluation of rhythm problem.  No high rate or low rate arrhythmia were noted.  Estimated battery longevity is 7.41yr I have personally reviewed the device data and amended the report as necessary.    No orders of the defined types were placed in this encounter.   Follow-up: No follow-ups on file.    Cletis Athens, MD

## 2021-10-22 NOTE — Assessment & Plan Note (Signed)
There is no syncope or arrhythmia

## 2021-10-22 NOTE — Assessment & Plan Note (Signed)
Take Claritin 5 mg p.o. daily 

## 2021-10-22 NOTE — Assessment & Plan Note (Signed)
Pacemaker is functioning well.

## 2021-10-22 NOTE — Assessment & Plan Note (Signed)

## 2022-01-21 ENCOUNTER — Ambulatory Visit (INDEPENDENT_AMBULATORY_CARE_PROVIDER_SITE_OTHER): Payer: MEDICARE | Admitting: Internal Medicine

## 2022-01-21 ENCOUNTER — Encounter: Payer: Self-pay | Admitting: Internal Medicine

## 2022-01-21 VITALS — BP 110/68 | HR 86 | Ht 68.0 in | Wt 236.0 lb

## 2022-01-21 DIAGNOSIS — I10 Essential (primary) hypertension: Secondary | ICD-10-CM | POA: Diagnosis not present

## 2022-01-21 DIAGNOSIS — I495 Sick sinus syndrome: Secondary | ICD-10-CM

## 2022-01-21 DIAGNOSIS — E669 Obesity, unspecified: Secondary | ICD-10-CM | POA: Diagnosis not present

## 2022-01-21 DIAGNOSIS — Z950 Presence of cardiac pacemaker: Secondary | ICD-10-CM | POA: Diagnosis not present

## 2022-01-21 NOTE — Assessment & Plan Note (Signed)
Pacemaker is working well 

## 2022-01-21 NOTE — Assessment & Plan Note (Signed)

## 2022-01-21 NOTE — Progress Notes (Addendum)
Established Patient Office Visit  Subjective:  Patient ID: Samuel Mack, male    DOB: 01-31-1930  Age: 88 y.o. MRN: [id number]  CC: No chief complaint on file.   HPI  GRABIEL FINUCAN presents for general checkup for pacemaker and obesity, he is diabetic, past medical history and medication as listed below GFR is 41 patient denies any chest pain or shortness of breath there is no history of palpitation or paroxysmal nocturnal dyspnea   patient was advised to follow low-salt low-cholesterol diet      Past Surgical History:  Procedure Laterality Date   CATARACT EXTRACTION W/PHACO Left 12/11/2016   Procedure: CATARACT EXTRACTION PHACO AND INTRAOCULAR LENS PLACEMENT ([facility]);  Surgeon: Lockie Mola, MD;  Location: [facility];  Service: Ophthalmology;  Laterality: Left;  IVA TOPICAL LEFT   EYE SURGERY Left    cataract   NASAL SINUS SURGERY     PACEMAKER GENERATOR CHANGE Left    x2   PACEMAKER INSERTION  2002   PACEMAKER INSERTION Left 05/12/2019   Procedure: INSERTION PACEMAKER, LEFT;  Surgeon: Corky Downs, MD;  Location: [facility];  Service: Cardiovascular;  Laterality: Left;   PROSTATE SURGERY     TRANSURETHRAL RESECTION OF PROSTATE N/A 06/20/2020   Procedure: TRANSURETHRAL RESECTION OF THE PROSTATE (TURP);  Surgeon: Riki Altes, MD;  Location: [facility];  Service: Urology;  Laterality: N/A;    History reviewed. No pertinent family history.  Social History   Socioeconomic History   Marital status: Widowed    Spouse name: Not on file   Number of children: Not on file   Years of education: Not on file   Highest education level: Not on file  Occupational History   Not on file  Tobacco Use   Smoking status: Former    Types: Cigarettes    Quit date: 01/26/1979    Years since quitting: 43.0   Smokeless tobacco: Never  Vaping Use   Vaping Use: Never used  Substance and Sexual Activity   Alcohol use: No   Drug use: No   Sexual activity: Not on file   Other Topics Concern   Not on file  Social History Narrative   Not on file   Social Determinants of Health   Financial Resource Strain: Not on file  Food Insecurity: Not on file  Transportation Needs: Not on file  Physical Activity: Not on file  Stress: Not on file  Social Connections: Not on file  Intimate Partner Violence: Not on file     Current Outpatient Medications:    acetaminophen (TYLENOL 8 HOUR) 650 MG CR tablet, Take 1 tablet (650 mg total) by mouth every 8 (eight) hours as needed for pain., Disp: 2 tablet, Rfl: 1   aspirin 81 MG tablet, Take 81 mg by mouth daily., Disp: , Rfl:    atorvastatin (LIPITOR) 10 MG tablet, Take 10 mg by mouth at bedtime., Disp: , Rfl:    cholecalciferol (VITAMIN D) 25 MCG (1000 UNIT) tablet, Take 1,000 Units by mouth in the morning and at bedtime., Disp: , Rfl:    Coenzyme Q10 100 MG TABS, Take 100 mg by mouth daily. , Disp: , Rfl:    digoxin (LANOXIN) 0.125 MG tablet, TAKE 1 TABLET BY MOUTH EVERY DAY, Disp: 90 tablet, Rfl: 3   diltiazem (DILACOR XR) 180 MG 24 hr capsule, Take 180 mg by mouth daily., Disp: , Rfl:    levocetirizine (XYZAL) 5 MG tablet, Take 5 mg by mouth every evening., Disp: ,  Rfl:    levothyroxine (SYNTHROID) 175 MCG tablet, Take 175 mcg by mouth daily before breakfast. , Disp: , Rfl:    losartan (COZAAR) 100 MG tablet, Take 100 mg by mouth daily., Disp: , Rfl:    Multiple Vitamins-Minerals (PRESERVISION AREDS PO), Take by mouth 2 (two) times daily., Disp: , Rfl:    nebivolol (BYSTOLIC) 10 MG tablet, Take 10 mg by mouth daily., Disp: , Rfl:    nitrofurantoin, macrocrystal-monohydrate, (MACROBID) 100 MG capsule, Take 1 capsule (100 mg total) by mouth 2 (two) times daily., Disp: 14 capsule, Rfl: 0   Omega-3 Fatty Acids (FISH OIL) 1200 MG CAPS, Take 1,200 mg by mouth 3 (three) times daily., Disp: , Rfl:    potassium gluconate 595 (99 K) MG TABS tablet, Take 595 mg by mouth daily., Disp: , Rfl:    spironolactone (ALDACTONE) 25 MG  tablet, Take 25 mg by mouth daily., Disp: , Rfl:    vitamin B-12 (CYANOCOBALAMIN) 1000 MCG tablet, Take 1,000 mcg by mouth daily., Disp: , Rfl:    vitamin C (ASCORBIC ACID) 500 MG tablet, Take 500 mg by mouth daily., Disp: , Rfl:    vitamin E 180 MG (400 UNITS) capsule, Take 400 Units by mouth daily., Disp: , Rfl:    Allergies  Allergen Reactions   Bee Venom Swelling   Penicillins Swelling    Patient reports he swells and it was "something in the pcn" 50 year ago    ROS Review of Systems  Constitutional: Negative.   HENT: Negative.    Eyes: Negative.   Respiratory: Negative.    Cardiovascular: Negative.   Gastrointestinal: Negative.   Endocrine: Negative.   Genitourinary: Negative.   Musculoskeletal: Negative.   Skin: Negative.   Allergic/Immunologic: Negative.   Neurological: Negative.   Hematological: Negative.   Psychiatric/Behavioral: Negative.    All other systems reviewed and are negative.    Objective:    Physical Exam Vitals reviewed.  Constitutional:      Appearance: Normal appearance.  HENT:     Mouth/Throat:     Mouth: Mucous membranes are moist.  Eyes:     Pupils: Pupils are equal, round, and reactive to light.  Neck:     Vascular: No carotid bruit.  Cardiovascular:     Rate and Rhythm: Normal rate and regular rhythm.     Pulses: Normal pulses.     Heart sounds: Normal heart sounds.  Pulmonary:     Effort: Pulmonary effort is normal.     Breath sounds: Normal breath sounds.  Abdominal:     General: Bowel sounds are normal.     Palpations: Abdomen is soft. There is no hepatomegaly, splenomegaly or mass.     Tenderness: There is no abdominal tenderness.     Hernia: No hernia is present.  Musculoskeletal:     Cervical back: Neck supple.     Right lower leg: No edema.     Left lower leg: No edema.  Skin:    Findings: No rash.  Neurological:     Mental Status: He is alert and oriented to person, place, and time.     Motor: No weakness.   Psychiatric:        Mood and Affect: Mood normal.        Behavior: Behavior normal.    BP 110/68   Pulse 86   Ht 5\' 8"  (1.727 m)   Wt 236 lb (107 kg)   BMI 35.88 kg/m  Wt Readings from Last 3 Encounters:  01/21/22 236 lb (107 kg)  10/22/21 236 lb (107 kg)  07/23/21 236 lb 9.6 oz (107.3 kg)     Health Maintenance Due  Topic Date Due   TETANUS/TDAP  Never done   Zoster Vaccines- Shingrix (1 of 2) Never done   Pneumonia Vaccine 49+ Years old (1 - PCV) Never done   COVID-19 Vaccine (4 - Booster for Pfizer series) 09/20/2020    There are no preventive care reminders to display for this patient.  No results found for: TSH Lab Results  Component Value Date   WBC 13.6 (H) 06/16/2020   HGB 13.3 06/16/2020   HCT 39.7 06/16/2020   MCV 92.1 06/16/2020   PLT 204 06/16/2020   Lab Results  Component Value Date   NA 132 (L) 06/16/2020   K 4.3 06/16/2020   CO2 22 06/16/2020   GLUCOSE 100 (H) 06/16/2020   BUN 23 06/16/2020   CREATININE 1.51 (H) 06/16/2020   BILITOT 0.7 05/10/2019   ALKPHOS 69 05/10/2019   AST 23 05/10/2019   ALT 22 05/10/2019   PROT 6.9 05/10/2019   ALBUMIN 3.8 05/10/2019   CALCIUM 9.3 06/16/2020   ANIONGAP 10 06/16/2020   No results found for: CHOL No results found for: HDL No results found for: LDLCALC No results found for: TRIG No results found for: CHOLHDL No results found for: [id number]    Assessment & Plan:   Problem List Items Addressed This Visit       Cardiovascular and Mediastinum   Sick sinus syndrome ([facility])    Pacemaker is working well        Essential hypertension     Patient denies any chest pain or shortness of breath there is no history of palpitation or paroxysmal nocturnal dyspnea   patient was advised to follow low-salt low-cholesterol diet    ideally I want to keep systolic blood pressure below 469 mmHg, patient was asked to check blood pressure one times a week and give me a report on that.  Patient will be follow-up in 3  months  or earlier as needed, patient will call me back for any change in the cardiovascular symptoms Patient was advised to buy a book from local bookstore concerning blood pressure and read several chapters  every day.  This will be supplemented by some of the material we will give him from the office.  Patient should also utilize other resources like YouTube and Internet to learn more about the blood pressure and the diet.         Other   Cardiac pacemaker in situ - Primary    Assessment & Plan:  Note: Medical Device Follow-up  Patient pacemaker was interrogated by pacemakers analyzer, battery status is okay.  No programming changes were indicated after the review of the data.  Histogram shows no change since the last interrogation Atrial and ventricular sensing thresholds were found to be acceptable Impedance was checked and it was found to be normal.  Thresholds were found to be okay on evaluation of rhythm problem.  No high rate or low rate arrhythmia were noted.  Estimated battery longevity is 9.7 years. I have personally reviewed the device data and amended the report as necessary.        Relevant Orders   PACEMAKER IN CLINIC CHECK   Obesity (BMI 30-39.9)    - I encouraged the patient to lose weight.  - I educated them on making healthy dietary choices including eating more fruits and vegetables and less  fried foods. - I encouraged the patient to exercise more, and educated on the benefits of exercise including weight loss, diabetes prevention, and hypertension prevention.   Dietary counseling with a registered dietician  Referral to a weight management support group (e.g. Weight Watchers, Overeaters Anonymous)  If your BMI is greater than 29 or you have gained more than 15 pounds you should work on weight loss.  Attend a healthy cooking class         No orders of the defined types were placed in this encounter.   Follow-up: No follow-ups on file.    Corky Downs,  MD

## 2022-01-21 NOTE — Assessment & Plan Note (Signed)
Assessment & Plan: ? ?Note: Medical Device Follow-up ? ?Patient pacemaker was interrogated by pacemakers analyzer, battery status is okay.  No programming changes were indicated after the review of the data.  Histogram shows no change since the last interrogation ?Atrial and ventricular sensing thresholds were found to be acceptable ?Impedance was checked and it was found to be normal.  Thresholds were found to be okay on evaluation of rhythm problem.  No high rate or low rate arrhythmia were noted.  Estimated battery longevity is 9.7 years. I have personally reviewed the device data and amended the report as necessary.  ?

## 2022-01-21 NOTE — Assessment & Plan Note (Signed)

## 2022-03-12 ENCOUNTER — Other Ambulatory Visit: Payer: Self-pay | Admitting: Internal Medicine

## 2022-04-16 ENCOUNTER — Emergency Department: Payer: MEDICARE

## 2022-04-16 ENCOUNTER — Inpatient Hospital Stay
Admission: EM | Admit: 2022-04-16 | Discharge: 2022-04-20 | DRG: 445 | Disposition: A | Discharge status: Home / Self Care | Payer: MEDICARE | Attending: Internal Medicine | Admitting: Internal Medicine

## 2022-04-16 DIAGNOSIS — K8051 Calculus of bile duct without cholangitis or cholecystitis with obstruction: Principal | ICD-10-CM | POA: Diagnosis present

## 2022-04-16 DIAGNOSIS — K760 Fatty (change of) liver, not elsewhere classified: Secondary | ICD-10-CM | POA: Diagnosis present

## 2022-04-16 DIAGNOSIS — R112 Nausea with vomiting, unspecified: Secondary | ICD-10-CM

## 2022-04-16 DIAGNOSIS — Z9079 Acquired absence of other genital organ(s): Secondary | ICD-10-CM

## 2022-04-16 DIAGNOSIS — J45909 Unspecified asthma, uncomplicated: Secondary | ICD-10-CM | POA: Diagnosis present

## 2022-04-16 DIAGNOSIS — I495 Sick sinus syndrome: Secondary | ICD-10-CM

## 2022-04-16 DIAGNOSIS — I129 Hypertensive chronic kidney disease with stage 1 through stage 4 chronic kidney disease, or unspecified chronic kidney disease: Secondary | ICD-10-CM | POA: Diagnosis present

## 2022-04-16 DIAGNOSIS — T82119A Breakdown (mechanical) of unspecified cardiac electronic device, initial encounter: Secondary | ICD-10-CM | POA: Diagnosis present

## 2022-04-16 DIAGNOSIS — K8050 Calculus of bile duct without cholangitis or cholecystitis without obstruction: Secondary | ICD-10-CM

## 2022-04-16 DIAGNOSIS — Z823 Family history of stroke: Secondary | ICD-10-CM

## 2022-04-16 DIAGNOSIS — I252 Old myocardial infarction: Secondary | ICD-10-CM

## 2022-04-16 DIAGNOSIS — Z950 Presence of cardiac pacemaker: Secondary | ICD-10-CM

## 2022-04-16 DIAGNOSIS — N183 Chronic kidney disease, stage 3 unspecified: Secondary | ICD-10-CM

## 2022-04-16 DIAGNOSIS — I2510 Atherosclerotic heart disease of native coronary artery without angina pectoris: Secondary | ICD-10-CM | POA: Diagnosis present

## 2022-04-16 DIAGNOSIS — Z6833 Body mass index (BMI) 33.0-33.9, adult: Secondary | ICD-10-CM

## 2022-04-16 DIAGNOSIS — Z87891 Personal history of nicotine dependence: Secondary | ICD-10-CM

## 2022-04-16 DIAGNOSIS — R7401 Elevation of levels of liver transaminase levels: Secondary | ICD-10-CM

## 2022-04-16 DIAGNOSIS — Z880 Allergy status to penicillin: Secondary | ICD-10-CM

## 2022-04-16 DIAGNOSIS — K828 Other specified diseases of gallbladder: Secondary | ICD-10-CM | POA: Diagnosis present

## 2022-04-16 DIAGNOSIS — R001 Bradycardia, unspecified: Secondary | ICD-10-CM

## 2022-04-16 DIAGNOSIS — Y712 Prosthetic and other implants, materials and accessory cardiovascular devices associated with adverse incidents: Secondary | ICD-10-CM | POA: Diagnosis present

## 2022-04-16 DIAGNOSIS — Z85828 Personal history of other malignant neoplasm of skin: Secondary | ICD-10-CM

## 2022-04-16 DIAGNOSIS — E669 Obesity, unspecified: Secondary | ICD-10-CM | POA: Diagnosis present

## 2022-04-16 DIAGNOSIS — Z79899 Other long term (current) drug therapy: Secondary | ICD-10-CM

## 2022-04-16 DIAGNOSIS — I10 Essential (primary) hypertension: Secondary | ICD-10-CM | POA: Diagnosis present

## 2022-04-16 DIAGNOSIS — R7989 Other specified abnormal findings of blood chemistry: Secondary | ICD-10-CM | POA: Diagnosis present

## 2022-04-16 DIAGNOSIS — E872 Acidosis, unspecified: Secondary | ICD-10-CM | POA: Diagnosis not present

## 2022-04-16 DIAGNOSIS — E860 Dehydration: Secondary | ICD-10-CM | POA: Diagnosis present

## 2022-04-16 DIAGNOSIS — D72829 Elevated white blood cell count, unspecified: Secondary | ICD-10-CM | POA: Diagnosis present

## 2022-04-16 DIAGNOSIS — H9190 Unspecified hearing loss, unspecified ear: Secondary | ICD-10-CM | POA: Diagnosis present

## 2022-04-16 DIAGNOSIS — E785 Hyperlipidemia, unspecified: Secondary | ICD-10-CM

## 2022-04-16 DIAGNOSIS — E039 Hypothyroidism, unspecified: Secondary | ICD-10-CM

## 2022-04-16 DIAGNOSIS — E1122 Type 2 diabetes mellitus with diabetic chronic kidney disease: Secondary | ICD-10-CM | POA: Diagnosis present

## 2022-04-16 DIAGNOSIS — N179 Acute kidney failure, unspecified: Secondary | ICD-10-CM

## 2022-04-16 DIAGNOSIS — N1832 Chronic kidney disease, stage 3b: Secondary | ICD-10-CM | POA: Insufficient documentation

## 2022-04-16 LAB — CBC WITH DIFFERENTIAL/PLATELET
Abs Immature Granulocytes: 0.05 10*3/uL (ref 0.00–0.07)
Basophils Absolute: 0.1 10*3/uL (ref 0.0–0.1)
Basophils Relative: 0 %
Eosinophils Absolute: 0.1 10*3/uL (ref 0.0–0.5)
Eosinophils Relative: 1 %
HCT: 39.5 % (ref 39.0–52.0)
Hemoglobin: 12.6 g/dL — ABNORMAL LOW (ref 13.0–17.0)
Immature Granulocytes: 0 %
Lymphocytes Relative: 15 %
Lymphs Abs: 2.4 10*3/uL (ref 0.7–4.0)
MCH: 29.6 pg (ref 26.0–34.0)
MCHC: 31.9 g/dL (ref 30.0–36.0)
MCV: 92.7 fL (ref 80.0–100.0)
Monocytes Absolute: 0.6 10*3/uL (ref 0.1–1.0)
Monocytes Relative: 4 %
Neutro Abs: 12.6 10*3/uL — ABNORMAL HIGH (ref 1.7–7.7)
Neutrophils Relative %: 80 %
Platelets: 252 10*3/uL (ref 150–400)
RBC: 4.26 MIL/uL (ref 4.22–5.81)
RDW: 14.2 % (ref 11.5–15.5)
WBC: 15.8 10*3/uL — ABNORMAL HIGH (ref 4.0–10.5)
nRBC: 0 % (ref 0.0–0.2)

## 2022-04-16 LAB — COMPREHENSIVE METABOLIC PANEL
ALT: 316 U/L — ABNORMAL HIGH (ref 0–44)
AST: 492 U/L — ABNORMAL HIGH (ref 15–41)
Albumin: 4 g/dL (ref 3.5–5.0)
Alkaline Phosphatase: 334 U/L — ABNORMAL HIGH (ref 38–126)
Anion gap: 12 (ref 5–15)
BUN: 30 mg/dL — ABNORMAL HIGH (ref 8–23)
CO2: 24 mmol/L (ref 22–32)
Calcium: 9.3 mg/dL (ref 8.9–10.3)
Chloride: 99 mmol/L (ref 98–111)
Creatinine, Ser: 1.66 mg/dL — ABNORMAL HIGH (ref 0.61–1.24)
GFR, Estimated: 39 mL/min — ABNORMAL LOW (ref >60)
Glucose, Bld: 132 mg/dL — ABNORMAL HIGH (ref 70–99)
Potassium: 5.1 mmol/L (ref 3.5–5.1)
Sodium: 135 mmol/L (ref 135–145)
Total Bilirubin: 2.1 mg/dL — ABNORMAL HIGH (ref 0.3–1.2)
Total Protein: 7.8 g/dL (ref 6.5–8.1)

## 2022-04-16 LAB — PROTIME-INR
INR: 1.2 (ref 0.8–1.2)
Prothrombin Time: 15.5 seconds — ABNORMAL HIGH (ref 11.4–15.2)

## 2022-04-16 LAB — TROPONIN I (HIGH SENSITIVITY)
Troponin I (High Sensitivity): 11 ng/L (ref <18)
Troponin I (High Sensitivity): 13 ng/L (ref <18)

## 2022-04-16 LAB — BRAIN NATRIURETIC PEPTIDE: B Natriuretic Peptide: 175.7 pg/mL — ABNORMAL HIGH (ref 0.0–100.0)

## 2022-04-16 MED ORDER — LORAZEPAM 2 MG/ML IJ SOLN: 1.0000 mg | Freq: Once | INTRAMUSCULAR | Status: DC

## 2022-04-16 MED ORDER — PIPERACILLIN-TAZOBACTAM 3.375 G IVPB 30 MIN
3.3750 g | Freq: Once | INTRAVENOUS | Status: AC
  Administered 2022-04-16: 3.375 g via INTRAVENOUS
  Filled 2022-04-16: qty 50

## 2022-04-16 MED ORDER — LORAZEPAM 2 MG/ML IJ SOLN
0.5000 mg | Freq: Once | INTRAMUSCULAR | Status: DC
  Filled 2022-04-16: qty 1

## 2022-04-16 MED ORDER — IOHEXOL 300 MG/ML  SOLN
75.0000 mL | Freq: Once | INTRAMUSCULAR | Status: AC | PRN
  Administered 2022-04-16: 75 mL via INTRAVENOUS

## 2022-04-16 MED ORDER — SODIUM CHLORIDE 0.9 % IV BOLUS
1000.0000 mL | Freq: Once | INTRAVENOUS | Status: AC
  Administered 2022-04-16: 1000 mL via INTRAVENOUS

## 2022-04-16 MED ORDER — LORAZEPAM 0.5 MG PO TABS: 0.5000 mg | ORAL_TABLET | Freq: Once | ORAL | Status: DC

## 2022-04-16 NOTE — ED Triage Notes (Signed)
Pt brought in by EMS today for shaking that started today. EMS sts that they put pt on the monior and noticed that his HR is ranging from 30-100 bpm. Pt has a pacemaker that was replaced about four years ago. Per EMS pt is having long sinus pauses. Pt is A/Ox4 at this time.

## 2022-04-16 NOTE — Progress Notes (Signed)
Pacemaker [employer]onic AzureXT Pulse Generator Serial # [id number] H Model # [id number]

## 2022-04-17 ENCOUNTER — Other Ambulatory Visit: Payer: Self-pay

## 2022-04-17 ENCOUNTER — Telehealth: Payer: Self-pay | Admitting: Physician Assistant

## 2022-04-17 ENCOUNTER — Encounter: Payer: Self-pay | Admitting: Family Medicine

## 2022-04-17 ENCOUNTER — Inpatient Hospital Stay: Payer: MEDICARE

## 2022-04-17 DIAGNOSIS — I495 Sick sinus syndrome: Secondary | ICD-10-CM

## 2022-04-17 DIAGNOSIS — Z880 Allergy status to penicillin: Secondary | ICD-10-CM | POA: Diagnosis not present

## 2022-04-17 DIAGNOSIS — E039 Hypothyroidism, unspecified: Secondary | ICD-10-CM

## 2022-04-17 DIAGNOSIS — Z85828 Personal history of other malignant neoplasm of skin: Secondary | ICD-10-CM | POA: Diagnosis not present

## 2022-04-17 DIAGNOSIS — K760 Fatty (change of) liver, not elsewhere classified: Secondary | ICD-10-CM | POA: Diagnosis present

## 2022-04-17 DIAGNOSIS — Z87891 Personal history of nicotine dependence: Secondary | ICD-10-CM | POA: Diagnosis not present

## 2022-04-17 DIAGNOSIS — H9190 Unspecified hearing loss, unspecified ear: Secondary | ICD-10-CM | POA: Diagnosis present

## 2022-04-17 DIAGNOSIS — R112 Nausea with vomiting, unspecified: Secondary | ICD-10-CM | POA: Diagnosis not present

## 2022-04-17 DIAGNOSIS — I2510 Atherosclerotic heart disease of native coronary artery without angina pectoris: Secondary | ICD-10-CM | POA: Diagnosis present

## 2022-04-17 DIAGNOSIS — J45909 Unspecified asthma, uncomplicated: Secondary | ICD-10-CM | POA: Diagnosis present

## 2022-04-17 DIAGNOSIS — N1832 Chronic kidney disease, stage 3b: Secondary | ICD-10-CM | POA: Insufficient documentation

## 2022-04-17 DIAGNOSIS — K8051 Calculus of bile duct without cholangitis or cholecystitis with obstruction: Secondary | ICD-10-CM | POA: Diagnosis present

## 2022-04-17 DIAGNOSIS — I10 Essential (primary) hypertension: Secondary | ICD-10-CM | POA: Diagnosis not present

## 2022-04-17 DIAGNOSIS — E1122 Type 2 diabetes mellitus with diabetic chronic kidney disease: Secondary | ICD-10-CM | POA: Diagnosis present

## 2022-04-17 DIAGNOSIS — E785 Hyperlipidemia, unspecified: Secondary | ICD-10-CM | POA: Diagnosis present

## 2022-04-17 DIAGNOSIS — Z950 Presence of cardiac pacemaker: Secondary | ICD-10-CM | POA: Diagnosis not present

## 2022-04-17 DIAGNOSIS — R0609 Other forms of dyspnea: Secondary | ICD-10-CM | POA: Diagnosis not present

## 2022-04-17 DIAGNOSIS — I129 Hypertensive chronic kidney disease with stage 1 through stage 4 chronic kidney disease, or unspecified chronic kidney disease: Secondary | ICD-10-CM | POA: Diagnosis present

## 2022-04-17 DIAGNOSIS — R7401 Elevation of levels of liver transaminase levels: Secondary | ICD-10-CM | POA: Diagnosis present

## 2022-04-17 DIAGNOSIS — I252 Old myocardial infarction: Secondary | ICD-10-CM | POA: Diagnosis not present

## 2022-04-17 DIAGNOSIS — R001 Bradycardia, unspecified: Secondary | ICD-10-CM | POA: Diagnosis not present

## 2022-04-17 DIAGNOSIS — E872 Acidosis, unspecified: Secondary | ICD-10-CM | POA: Diagnosis not present

## 2022-04-17 DIAGNOSIS — T82119A Breakdown (mechanical) of unspecified cardiac electronic device, initial encounter: Secondary | ICD-10-CM | POA: Diagnosis present

## 2022-04-17 DIAGNOSIS — D72829 Elevated white blood cell count, unspecified: Secondary | ICD-10-CM | POA: Diagnosis present

## 2022-04-17 DIAGNOSIS — N183 Chronic kidney disease, stage 3 unspecified: Secondary | ICD-10-CM

## 2022-04-17 DIAGNOSIS — R7989 Other specified abnormal findings of blood chemistry: Secondary | ICD-10-CM | POA: Diagnosis not present

## 2022-04-17 DIAGNOSIS — K8050 Calculus of bile duct without cholangitis or cholecystitis without obstruction: Secondary | ICD-10-CM | POA: Diagnosis not present

## 2022-04-17 DIAGNOSIS — E860 Dehydration: Secondary | ICD-10-CM | POA: Diagnosis present

## 2022-04-17 DIAGNOSIS — Z6833 Body mass index (BMI) 33.0-33.9, adult: Secondary | ICD-10-CM | POA: Diagnosis not present

## 2022-04-17 DIAGNOSIS — E669 Obesity, unspecified: Secondary | ICD-10-CM | POA: Diagnosis present

## 2022-04-17 DIAGNOSIS — K828 Other specified diseases of gallbladder: Secondary | ICD-10-CM | POA: Diagnosis present

## 2022-04-17 DIAGNOSIS — N179 Acute kidney failure, unspecified: Secondary | ICD-10-CM | POA: Diagnosis present

## 2022-04-17 DIAGNOSIS — Y712 Prosthetic and other implants, materials and accessory cardiovascular devices associated with adverse incidents: Secondary | ICD-10-CM | POA: Diagnosis present

## 2022-04-17 LAB — URINALYSIS, ROUTINE W REFLEX MICROSCOPIC
Bilirubin Urine: NEGATIVE
Glucose, UA: NEGATIVE mg/dL
Hgb urine dipstick: NEGATIVE
Ketones, ur: 5 mg/dL — AB
Leukocytes,Ua: NEGATIVE
Nitrite: NEGATIVE
Protein, ur: NEGATIVE mg/dL
Specific Gravity, Urine: >1.046 — ABNORMAL HIGH (ref 1.005–1.030)
pH: 5 (ref 5.0–8.0)

## 2022-04-17 LAB — CBC
HCT: 33.7 % — ABNORMAL LOW (ref 39.0–52.0)
Hemoglobin: 11.1 g/dL — ABNORMAL LOW (ref 13.0–17.0)
MCH: 30 pg (ref 26.0–34.0)
MCHC: 32.9 g/dL (ref 30.0–36.0)
MCV: 91.1 fL (ref 80.0–100.0)
Platelets: 180 10*3/uL (ref 150–400)
RBC: 3.7 MIL/uL — ABNORMAL LOW (ref 4.22–5.81)
RDW: 14.2 % (ref 11.5–15.5)
WBC: 20.7 10*3/uL — ABNORMAL HIGH (ref 4.0–10.5)
nRBC: 0 % (ref 0.0–0.2)

## 2022-04-17 LAB — BASIC METABOLIC PANEL
Anion gap: 8 (ref 5–15)
BUN: 32 mg/dL — ABNORMAL HIGH (ref 8–23)
CO2: 21 mmol/L — ABNORMAL LOW (ref 22–32)
Calcium: 8.3 mg/dL — ABNORMAL LOW (ref 8.9–10.3)
Chloride: 102 mmol/L (ref 98–111)
Creatinine, Ser: 1.9 mg/dL — ABNORMAL HIGH (ref 0.61–1.24)
GFR, Estimated: 33 mL/min — ABNORMAL LOW (ref >60)
Glucose, Bld: 98 mg/dL (ref 70–99)
Potassium: 4.9 mmol/L (ref 3.5–5.1)
Sodium: 131 mmol/L — ABNORMAL LOW (ref 135–145)

## 2022-04-17 LAB — HEPATIC FUNCTION PANEL
ALT: 412 U/L — ABNORMAL HIGH (ref 0–44)
AST: 413 U/L — ABNORMAL HIGH (ref 15–41)
Albumin: 3.3 g/dL — ABNORMAL LOW (ref 3.5–5.0)
Alkaline Phosphatase: 300 U/L — ABNORMAL HIGH (ref 38–126)
Bilirubin, Direct: 1.5 mg/dL — ABNORMAL HIGH (ref 0.0–0.2)
Indirect Bilirubin: 1 mg/dL — ABNORMAL HIGH (ref 0.3–0.9)
Total Bilirubin: 2.5 mg/dL — ABNORMAL HIGH (ref 0.3–1.2)
Total Protein: 6.8 g/dL (ref 6.5–8.1)

## 2022-04-17 LAB — DIGOXIN LEVEL: Digoxin Level: 0.8 ng/mL (ref 0.8–2.0)

## 2022-04-17 MED ORDER — ACETAMINOPHEN 650 MG RE SUPP: 650.0000 mg | Freq: Four times a day (QID) | RECTAL | Status: DC | PRN

## 2022-04-17 MED ORDER — SPIRONOLACTONE 25 MG PO TABS: 25.0000 mg | ORAL_TABLET | Freq: Every day | ORAL | Status: DC

## 2022-04-17 MED ORDER — OMEGA-3-ACID ETHYL ESTERS 1 G PO CAPS
1.0000 g | ORAL_CAPSULE | Freq: Three times a day (TID) | ORAL | Status: DC
  Filled 2022-04-17: qty 1

## 2022-04-17 MED ORDER — ASCORBIC ACID 500 MG PO TABS: 500.0000 mg | ORAL_TABLET | Freq: Every day | ORAL | Status: DC

## 2022-04-17 MED ORDER — MORPHINE SULFATE (PF) 2 MG/ML IV SOLN: 2.0000 mg | INTRAVENOUS | Status: DC | PRN

## 2022-04-17 MED ORDER — TRAZODONE HCL 50 MG PO TABS: 25.0000 mg | ORAL_TABLET | Freq: Every evening | ORAL | Status: DC | PRN

## 2022-04-17 MED ORDER — CETIRIZINE HCL 10 MG PO TABS
10.0000 mg | ORAL_TABLET | Freq: Every evening | ORAL | Status: DC
  Administered 2022-04-17 – 2022-04-19 (×3): 10 mg via ORAL
  Filled 2022-04-17 (×4): qty 1

## 2022-04-17 MED ORDER — NEBIVOLOL HCL 10 MG PO TABS
10.0000 mg | ORAL_TABLET | Freq: Every day | ORAL | Status: DC
  Administered 2022-04-18 – 2022-04-20 (×2): 10 mg via ORAL
  Filled 2022-04-17 (×4): qty 1

## 2022-04-17 MED ORDER — ONDANSETRON HCL 4 MG/2ML IJ SOLN: 4.0000 mg | Freq: Four times a day (QID) | INTRAMUSCULAR | Status: DC | PRN

## 2022-04-17 MED ORDER — SODIUM CHLORIDE 0.9 % IV SOLN: INTRAVENOUS | Status: DC

## 2022-04-17 MED ORDER — POTASSIUM GLUCONATE 595 (99 K) MG PO TABS: 595.0000 mg | ORAL_TABLET | Freq: Every day | ORAL | Status: DC

## 2022-04-17 MED ORDER — VITAMIN E 45 MG (100 UNIT) PO CAPS
400.0000 [IU] | ORAL_CAPSULE | Freq: Every day | ORAL | Status: DC
  Filled 2022-04-17: qty 4

## 2022-04-17 MED ORDER — DIGOXIN 125 MCG PO TABS
125.0000 ug | ORAL_TABLET | Freq: Every day | ORAL | Status: DC
  Administered 2022-04-18 – 2022-04-20 (×2): 125 ug via ORAL
  Filled 2022-04-17 (×4): qty 1

## 2022-04-17 MED ORDER — ATORVASTATIN CALCIUM 20 MG PO TABS
10.0000 mg | ORAL_TABLET | Freq: Every day | ORAL | Status: DC
Start: 2022-04-17 — End: 2022-04-17

## 2022-04-17 MED ORDER — ALBUMIN HUMAN 25 % IV SOLN
25.0000 g | Freq: Once | INTRAVENOUS | Status: AC
  Administered 2022-04-17: 25 g via INTRAVENOUS
  Filled 2022-04-17: qty 100

## 2022-04-17 MED ORDER — LOSARTAN POTASSIUM 50 MG PO TABS: 100.0000 mg | ORAL_TABLET | Freq: Every day | ORAL | Status: DC

## 2022-04-17 MED ORDER — PIPERACILLIN-TAZOBACTAM 3.375 G IVPB
3.3750 g | Freq: Three times a day (TID) | INTRAVENOUS | Status: DC
Start: 2022-04-17 — End: 2022-04-20
  Administered 2022-04-17 – 2022-04-20 (×9): 3.375 g via INTRAVENOUS
  Filled 2022-04-17 (×9): qty 50

## 2022-04-17 MED ORDER — COENZYME Q10 100 MG PO TABS: 100.0000 mg | ORAL_TABLET | Freq: Every day | ORAL | Status: DC

## 2022-04-17 MED ORDER — VITAMIN B-12 1000 MCG PO TABS
1000.0000 ug | ORAL_TABLET | Freq: Every day | ORAL | Status: DC
  Filled 2022-04-17: qty 1

## 2022-04-17 MED ORDER — DILTIAZEM HCL ER COATED BEADS 180 MG PO CP24
180.0000 mg | ORAL_CAPSULE | Freq: Every day | ORAL | Status: DC
  Administered 2022-04-18 – 2022-04-20 (×2): 180 mg via ORAL
  Filled 2022-04-17 (×4): qty 1

## 2022-04-17 MED ORDER — ONDANSETRON HCL 4 MG PO TABS: 4.0000 mg | ORAL_TABLET | Freq: Four times a day (QID) | ORAL | Status: DC | PRN

## 2022-04-17 MED ORDER — VITAMIN D 25 MCG (1000 UNIT) PO TABS
1000.0000 [IU] | ORAL_TABLET | Freq: Every day | ORAL | Status: DC
  Administered 2022-04-18 – 2022-04-20 (×2): 1000 [IU] via ORAL
  Filled 2022-04-17 (×4): qty 1

## 2022-04-17 MED ORDER — ENOXAPARIN SODIUM 60 MG/0.6ML IJ SOSY
0.5000 mg/kg | PREFILLED_SYRINGE | INTRAMUSCULAR | Status: DC
  Administered 2022-04-17 – 2022-04-20 (×3): 50 mg via SUBCUTANEOUS
  Filled 2022-04-17 (×4): qty 0.6

## 2022-04-17 MED ORDER — OCUVITE-LUTEIN PO CAPS
1.0000 | ORAL_CAPSULE | Freq: Two times a day (BID) | ORAL | Status: DC
  Administered 2022-04-17: 1 via ORAL
  Filled 2022-04-17 (×2): qty 1

## 2022-04-17 MED ORDER — LEVOTHYROXINE SODIUM 50 MCG PO TABS
175.0000 ug | ORAL_TABLET | Freq: Every day | ORAL | Status: DC
  Administered 2022-04-17 – 2022-04-20 (×3): 175 ug via ORAL
  Filled 2022-04-17 (×3): qty 1
  Filled 2022-04-17: qty 4

## 2022-04-17 MED ORDER — TECHNETIUM TC 99M MEBROFENIN IV KIT
6.8200 | PACK | Freq: Once | INTRAVENOUS | Status: AC | PRN
  Administered 2022-04-17: 6.82 via INTRAVENOUS

## 2022-04-17 MED ORDER — ACETAMINOPHEN 325 MG PO TABS
650.0000 mg | ORAL_TABLET | Freq: Four times a day (QID) | ORAL | Status: DC | PRN
  Administered 2022-04-20: 650 mg via ORAL
  Filled 2022-04-17: qty 2

## 2022-04-17 MED ORDER — MAGNESIUM HYDROXIDE 400 MG/5ML PO SUSP
30.0000 mL | Freq: Every day | ORAL | Status: DC | PRN
Start: 2022-04-17 — End: 2022-04-20

## 2022-04-17 NOTE — Plan of Care (Signed)

## 2022-04-17 NOTE — Consult Note (Signed)
[facility] SURGICAL CONSULTATION NOTE (initial) - cpt: 00923   HISTORY OF PRESENT ILLNESS (HPI):  88 y.o. male presented to [facility] ED overnight for evaluation of tremors. Patient reports he was sitting in his chair last night when he began having uncontrollable shaking/tremors in his upper and lower extremities. This onset suddenly without warning and last 45 minutes. EMS was called. He was also found to have bradycardia into the 30s as well. He denied any abdominal pain but daughter at bedside states he did have nausea and emesis when EMS was there. No fever, chills, CP, SOB, or bowel changes. Urinary at bedside is orange colored. No history of similar. He is somewhat poor historian. He does have pacemaker. Work up in the ED revealed leukocytosis to 15.8K (now 20.7K), acute on chronic kidney injury with sCr - 1.90, mild hyponatremia to 131, AST 492, ALT 316 (now 412, alkaline phosphatase elevation 334, and hyperbilirubinemia to 2.1 (now 2.5 with direct 1.5). He had RUQ [place] which showed potential sludge vs stone vs polyp. CT Abdomen/Pelvis was obtained and again showed possible stone vs calcified stricture but no definitive evidence of cholecystitis nor choledocholithiasis. MRCP was recommended but has not been obtained secondary to patient's pacemaker. He is currently admitted to the medicine service.   Surgery is consulted by emergency medicine physician Dr. Valora Piccolo., MD in this context for evaluation and management of hyperbilirubinemia without clear etiology.   PAST MEDICAL HISTORY (PMH):  Past Medical History:  Diagnosis Date   Asthma    Cancer ([place])    skin   Complication of anesthesia    previous pacemaker patient could feel everything   Diabetes mellitus without complication ([place])    diet controlled   Dyspnea    Dysrhythmia    Elevated lipids    Hypertension    Hypothyroidism    Myocardial infarction ([place])    Presence of permanent cardiac pacemaker      PAST  SURGICAL HISTORY [facility]):  Past Surgical History:  Procedure Laterality Date   CATARACT EXTRACTION W/PHACO Left 12/11/2016   Procedure: CATARACT EXTRACTION PHACO AND INTRAOCULAR LENS PLACEMENT ([place]);  Surgeon: Leandrew Koyanagi, MD;  Location: [place];  Service: Ophthalmology;  Laterality: Left;  IVA TOPICAL LEFT   EYE SURGERY Left    cataract   NASAL SINUS SURGERY     PACEMAKER GENERATOR CHANGE Left    x2   PACEMAKER INSERTION  2002   PACEMAKER INSERTION Left 05/12/2019   Procedure: INSERTION PACEMAKER, LEFT;  Surgeon: Cletis Athens, MD;  Location: [facility];  Service: Cardiovascular;  Laterality: Left;   PROSTATE SURGERY     TRANSURETHRAL RESECTION OF PROSTATE N/A 06/20/2020   Procedure: TRANSURETHRAL RESECTION OF THE PROSTATE (TURP);  Surgeon: [name], MD;  Location: [facility];  Service: Urology;  Laterality: N/A;     MEDICATIONS:  Prior to Admission medications   Medication Sig Start Date End Date Taking? Authorizing Provider  aspirin 81 MG tablet Take 81 mg by mouth daily.   Yes [provider]  atorvastatin (LIPITOR) 10 MG tablet Take 10 mg by mouth at bedtime.   Yes [provider]  cholecalciferol (VITAMIN D) 25 MCG (1000 UNIT) tablet Take 1,000 Units by mouth in the morning and at bedtime.   Yes [provider]  Coenzyme Q10 100 MG TABS Take 100 mg by mouth daily.    Yes [provider]  digoxin (LANOXIN) 0.125 MG tablet TAKE 1 TABLET BY MOUTH EVERY DAY 03/12/22  Yes  Cletis Athens, MD  diltiazem (DILACOR XR) 180 MG 24 hr capsule Take 180 mg by mouth daily.   Yes [provider]  levocetirizine (XYZAL) 5 MG tablet Take 5 mg by mouth every evening.   Yes [provider]  levothyroxine (SYNTHROID) 175 MCG tablet Take 175 mcg by mouth daily before breakfast.    Yes [provider]  losartan (COZAAR) 100 MG tablet Take 100 mg by mouth daily. 06/08/20  Yes [provider]  Multiple  Vitamins-Minerals (PRESERVISION AREDS PO) Take by mouth 2 (two) times daily.   Yes [provider]  nebivolol (BYSTOLIC) 10 MG tablet Take 10 mg by mouth daily.   Yes [provider]  Omega-3 Fatty Acids (FISH OIL) 1200 MG CAPS Take 1,200 mg by mouth 3 (three) times daily.   Yes [provider]  potassium gluconate 595 (99 K) MG TABS tablet Take 595 mg by mouth daily.   Yes [provider]  spironolactone (ALDACTONE) 25 MG tablet Take 25 mg by mouth daily.   Yes [provider]  vitamin B-12 (CYANOCOBALAMIN) 1000 MCG tablet Take 1,000 mcg by mouth daily.   Yes [provider]  vitamin C (ASCORBIC ACID) 500 MG tablet Take 500 mg by mouth daily.   Yes [provider]  vitamin E 180 MG (400 UNITS) capsule Take 400 Units by mouth daily.   Yes [provider]  acetaminophen (TYLENOL 8 HOUR) 650 MG CR tablet Take 1 tablet (650 mg total) by mouth every 8 (eight) hours as needed for pain. 02/08/15   Cletis Athens, MD  nitrofurantoin, macrocrystal-monohydrate, (MACROBID) 100 MG capsule Take 1 capsule (100 mg total) by mouth 2 (two) times daily. Patient not taking: Reported on 04/16/2022 06/16/20   Zara Council A, PA-C     ALLERGIES:  Allergies  Allergen Reactions   Bee Venom Swelling   Penicillins Swelling    Patient reports he swells and it was "something in the pcn" 50 year ago     SOCIAL HISTORY:  Social History   Socioeconomic History   Marital status: Widowed    Spouse name: Not on file   Number of children: Not on file   Years of education: Not on file   Highest education level: Not on file  Occupational History   Not on file  Tobacco Use   Smoking status: Former    Types: Cigarettes    Quit date: 01/26/1979    Years since quitting: 43.2   Smokeless tobacco: Never  Vaping Use   Vaping Use: Never used  Substance and Sexual Activity   Alcohol use: No   Drug use: No   Sexual activity: Not on file  Other  Topics Concern   Not on file  Social History Narrative   Not on file   Social Determinants of Health   Financial Resource Strain: Not on file  Food Insecurity: Not on file  Transportation Needs: Not on file  Physical Activity: Not on file  Stress: Not on file  Social Connections: Not on file  Intimate Partner Violence: Not on file     FAMILY HISTORY:  History reviewed. No pertinent family history.    REVIEW OF SYSTEMS:  Review of Systems  Constitutional:  Negative for chills and fever.  HENT:  Negative for congestion and sore throat.   Respiratory:  Negative for cough and shortness of breath.   Cardiovascular:  Negative for chest pain and palpitations.  Gastrointestinal:  Positive for nausea and vomiting. Negative  for abdominal pain and diarrhea.  Genitourinary:  Negative for dysuria and urgency.  Neurological:  Positive for tremors and weakness. Negative for dizziness, focal weakness, loss of consciousness and headaches.  All other systems reviewed and are negative.   VITAL SIGNS:  Temp:  [98 F (36.7 C)-98.4 F (36.9 C)] 98 F (36.7 C) (08/02 0800) Pulse Rate:  [69-95] 71 (08/02 0800) Resp:  [13-28] 15 (08/02 0800) BP: (103-151)/(48-100) 113/54 (08/02 0800) SpO2:  [91 %-100 %] 97 % (08/02 0800) Weight:  [100 kg] 100 kg (08/01 1819)     Height: '5\' 8"'$  (172.7 cm) Weight: 100 kg     INTAKE/OUTPUT:  No intake/output data recorded.  PHYSICAL EXAM:  Physical Exam Vitals and nursing note reviewed. Exam conducted with a chaperone present.  Constitutional:      General: He is not in acute distress.    Appearance: Normal appearance. He is obese. He is not ill-appearing.     Comments: Patient resting in bed, NAD  HENT:     Head: Normocephalic and atraumatic.  Eyes:     General: Scleral icterus present.  Cardiovascular:     Rate and Rhythm: Normal rate.     Pulses: Normal pulses.     Heart sounds: No murmur heard.    Comments: Paced rhythm  Abdominal:     General:  There is no distension.     Palpations: Abdomen is soft.     Tenderness: There is no abdominal tenderness. There is no guarding or rebound. Negative signs include Murphy's sign.     Comments: Abdomen is soft, no overt tenderness, he does not have a murphy's sign, non-distended. No rebound/guarding   Genitourinary:    Comments: Deferred; very dark orange colored urine in urinal  Musculoskeletal:     Right lower leg: No edema.     Left lower leg: No edema.  Skin:    General: Skin is warm and dry.     Coloration: Skin is not pale.     Findings: No erythema.  Neurological:     General: No focal deficit present.     Mental Status: He is alert and oriented to person, place, and time.  Psychiatric:        Mood and Affect: Mood normal.        Behavior: Behavior normal.      Labs:     Latest Ref Rng & Units 04/17/2022    4:17 AM 04/16/2022    6:24 PM 06/16/2020   11:58 AM  CBC  WBC 4.0 - 10.5 K/uL 20.7  15.8  13.6   Hemoglobin 13.0 - 17.0 g/dL 11.1  12.6  13.3   Hematocrit 39.0 - 52.0 % 33.7  39.5  39.7   Platelets 150 - 400 K/uL 180  252  204       Latest Ref Rng & Units 04/17/2022    4:17 AM 04/16/2022    6:24 PM 06/16/2020   11:58 AM  CMP  Glucose 70 - 99 mg/dL 98  132  100   BUN 8 - 23 mg/dL 32  30  23   Creatinine 0.61 - 1.24 mg/dL 1.90  1.66  1.51   Sodium 135 - 145 mmol/L 131  135  132   Potassium 3.5 - 5.1 mmol/L 4.9  5.1  4.3   Chloride 98 - 111 mmol/L 102  99  100   CO2 22 - 32 mmol/L '21  24  22   '$ Calcium 8.9 - 10.3 mg/dL 8.3  9.3  9.3   Total Protein 6.5 - 8.1 g/dL 6.8  7.8    Total Bilirubin 0.3 - 1.2 mg/dL 2.5  2.1    Alkaline Phos 38 - 126 U/L 300  334    AST 15 - 41 U/L 413  492    ALT 0 - 44 U/L 412  316       Imaging studies:   RUQ [place] (04/16/2022) personally reviewed which showed potential sludge vs stone vs polyp in the neck of the gallbladder, no definitive evidence of cholecystitis, and radiologist report reviewed below: IMPRESSION: Non mobile echogenic  focus within the gallbladder as described. Follow-up can be performed as clinically indicated given the patient's advanced age.   CT Abdomen/Pelvis (04/16/2022) personally reviewed again showing potential stone vs sludge vs poly in GB, ductal dilation, unable to evaluate for CBD stone, no evidence of definitive cholecystitis, and radiologist report reviewed below:  IMPRESSION: 1. No bowel obstruction. Normal appendix. 2. Noncalcified structure within the gallbladder as seen on the ultrasound. Mild biliary ductal dilatation. The presence of noncalcified stone in the central CBD is not excluded. Evaluation is very limited due to respiratory motion. MRCP may provide better evaluation if clinically indicated. 3. Fatty liver. 4. Aortic Atherosclerosis (ICD10-I70.0).    Assessment/Plan: (ICD-10's: K59.20) 88 y.o. male presenting with tremors incidentally found to have hyperbilirubinemia and elevated LFTs without clear etiology to this point.    - Agree that he will benefit from MRCP to better evaluate biliary tree and rule out choledocholithiasis or mass as source of hyperbilirubinemia. Unfortunately, he does have a pacemaker and this would need to be scheduled at [facility]. He does not have clear cut evidence of cholecystitis on examination nor imaging, but he does have leukocytosis. I am awaiting call back from interventional radiology to review case. One potential option is to place cholecystostomy tube and do cholangiogram at the same time but again, only if it is felt he does have cholecystitis. He does have HIDA scheduled but I am not sure if this will help [place] much.     - Continue NPO for now    - I think it is reasonable to continue Abx given WBC - No emergent indications for surgical intervention; He is a sub-optimal surgical candidate - Monitor abdominal examination - Pain control prn; antiemetics prn - Monitor leukocytosis; LFTs   - Further management per primary service; we will  follow   All of the above findings and recommendations were discussed with the patient and his family, and all of patient's and his family's questions were answered to their expressed satisfaction.  Thank you for the opportunity to participate in this patient's care.   -- [name], PA-C [facility] 04/17/2022, 8:19 AM M-F: 7am - 4pm

## 2022-04-17 NOTE — Assessment & Plan Note (Signed)
-   We will continue Synthroid. 

## 2022-04-17 NOTE — Assessment & Plan Note (Signed)
-   She has stage IIIb chronic kidney disease that is fairly stable.

## 2022-04-17 NOTE — Assessment & Plan Note (Signed)
-   This was apparently related to his pacemaker malfunction and exacerbated by recurrent nausea and vomiting.

## 2022-04-17 NOTE — ED Notes (Signed)
Pt urine sent at this time; pt stood up to urinate in the urinal, about 250 left per bladder scan

## 2022-04-17 NOTE — Consult Note (Signed)
GI Inpatient Consult Note  Reason for Consult: Distal CBD obstruction, cholestasis    Attending Requesting Consult: Dr. Debbe Odea, MD  History of Present Illness: Samuel Mack is a 88 y.o. male seen for evaluation of distal CBD obstruction at the request of hospitalist - Dr. Debbe Odea. Patient has a PMH of HTN, HLD, CKD Stage IIIb, CAD, SSS s/p PPM, T2DM, asthma, hypothyroidism, and obesity. He presented to the [facility] ED yesterday afternoon via EMS for chief complaint of sudden onset of tremors, nausea, vomiting, and generalized weakness. Upon EMS arrival he was bradycardic with HR in 30s. Upon presentation to the ED, BP 111/100, respirations 28, and otherwise normal vital signs. Labs were significant for WBC 15.8K, potassium 5.1, BUN 30, serum creatinine 1.66, alk phos 334, AST 492, ALT 316, and total bilirubin 2.1. RUQ US performed and showed distended gallbladder with 1.2 cm echogenic focus non-mobile near neck of gallbladder which could represent sludge ball versus polyp. Contrasted CT scan commented on noncalcified structure within gallbladder with mild biliary ductal dilatation. MRCP was recommended; however, patient unable to have MRI done here at [facility] given his PPM. HIDA scan performed today commented on distal CBD obstruction. He was started on IV antibiotics with IV Zosyn and IV fluid hydration. GI consulted for further evaluation and management.   Patient seen and examined this afternoon resting comfortably in hospital bed. No acute events overnight. His two daughters are with him in the room. Patient is very hard of hearing but able to answer all questions. He denies any fevers, chills, or altered mental status. He denies any abdominal pain. No recurrent emesis episodes. He does have mild nausea. Urine has been dark orange in color. No frank bloody or melanotic stools. WBC jumped from 15.8 yesterday to 20.7K this morning. LFTs remain elevated with alk phos 300, AST 413, ALT 412,  total bilirubin 2.5 (direct component 1.5). He reports appetite is at baseline. He reports over the past 6 months he has been trying to eat healthier. His primary doctor told him couple months ago to avoid sugars and sweets and he has been compliant with this and estimates a 15-lb weight loss. He denies any significant heartburn or acid reflux symptoms. He denies any odynophagia, early satiety, hoarseness, or epigastric abdominal pain. No other questions or complaints offered today.   Past Medical History:  Past Medical History:  Diagnosis Date   Asthma    Cancer ([place])    skin   Complication of anesthesia    previous pacemaker patient could feel everything   Diabetes mellitus without complication ([place])    diet controlled   Dyspnea    Dysrhythmia    Elevated lipids    Hypertension    Hypothyroidism    Myocardial infarction [facility])    Presence of permanent cardiac pacemaker     Problem List: Patient Active Problem List   Diagnosis Date Noted   Elevated LFTs 04/17/2022   Dyslipidemia 04/17/2022   Hypothyroidism 04/17/2022   Sinus bradycardia 04/17/2022   Stage III chronic kidney disease ([place]) 04/17/2022   Obesity (BMI 30-39.9) 07/23/2021   Pre-op evaluation 06/13/2020   Seasonal allergic rhinitis due to pollen 04/29/2020   Essential hypertension 04/29/2020   Cardiac pacemaker in situ 01/20/2020   Sick sinus syndrome ([place]) 01/20/2020    Past Surgical History: Past Surgical History:  Procedure Laterality Date   CATARACT EXTRACTION W/PHACO Left 12/11/2016   Procedure: CATARACT EXTRACTION PHACO AND INTRAOCULAR LENS PLACEMENT ([place]);  Surgeon: Leandrew Koyanagi,  MD;  Location: [place];  Service: Ophthalmology;  Laterality: Left;  IVA TOPICAL LEFT   EYE SURGERY Left    cataract   NASAL SINUS SURGERY     PACEMAKER GENERATOR CHANGE Left    x2   PACEMAKER INSERTION  2002   PACEMAKER INSERTION Left 05/12/2019   Procedure: INSERTION PACEMAKER, LEFT;  Surgeon: Cletis Athens,  MD;  Location: [facility] ORS;  Service: Cardiovascular;  Laterality: Left;   PROSTATE SURGERY     TRANSURETHRAL RESECTION OF PROSTATE N/A 06/20/2020   Procedure: TRANSURETHRAL RESECTION OF THE PROSTATE (TURP);  Surgeon: [name], MD;  Location: [facility] ORS;  Service: Urology;  Laterality: N/A;    Allergies: Allergies  Allergen Reactions   Bee Venom Swelling   Penicillins Swelling    Patient reports he swells and it was "something in the pcn" 50 year ago    Home Medications: Medications Prior to Admission  Medication Sig Dispense Refill Last Dose   aspirin 81 MG tablet Take 81 mg by mouth daily.   04/16/2022 at 0800   atorvastatin (LIPITOR) 10 MG tablet Take 10 mg by mouth at bedtime.   04/15/2022 at 2200   cholecalciferol (VITAMIN D) 25 MCG (1000 UNIT) tablet Take 1,000 Units by mouth in the morning and at bedtime.   04/16/2022 at 0800   Coenzyme Q10 100 MG TABS Take 100 mg by mouth daily.    04/16/2022 at 0800   digoxin (LANOXIN) 0.125 MG tablet TAKE 1 TABLET BY MOUTH EVERY DAY 90 tablet 3 04/16/2022 at 0800   diltiazem (DILACOR XR) 180 MG 24 hr capsule Take 180 mg by mouth daily.   04/16/2022 at 0800   levocetirizine (XYZAL) 5 MG tablet Take 5 mg by mouth every evening.   04/15/2022 at 2000   levothyroxine (SYNTHROID) 175 MCG tablet Take 175 mcg by mouth daily before breakfast.    04/16/2022 at 0600   losartan (COZAAR) 100 MG tablet Take 100 mg by mouth daily.   04/16/2022 at 0800   Multiple Vitamins-Minerals (PRESERVISION AREDS PO) Take by mouth 2 (two) times daily.   04/16/2022 at 0800   nebivolol (BYSTOLIC) 10 MG tablet Take 10 mg by mouth daily.   04/16/2022 at 0800   Omega-3 Fatty Acids (FISH OIL) 1200 MG CAPS Take 1,200 mg by mouth 3 (three) times daily.   04/16/2022   potassium gluconate 595 (99 K) MG TABS tablet Take 595 mg by mouth daily.   04/16/2022   spironolactone (ALDACTONE) 25 MG tablet Take 25 mg by mouth daily.   04/16/2022 at 0800   vitamin B-12 (CYANOCOBALAMIN) 1000 MCG tablet Take 1,000 mcg  by mouth daily.   04/16/2022   vitamin C (ASCORBIC ACID) 500 MG tablet Take 500 mg by mouth daily.   04/16/2022   vitamin E 180 MG (400 UNITS) capsule Take 400 Units by mouth daily.   04/16/2022   acetaminophen (TYLENOL 8 HOUR) 650 MG CR tablet Take 1 tablet (650 mg total) by mouth every 8 (eight) hours as needed for pain. 2 tablet 1 prn at prn   nitrofurantoin, macrocrystal-monohydrate, (MACROBID) 100 MG capsule Take 1 capsule (100 mg total) by mouth 2 (two) times daily. (Patient not taking: Reported on 04/16/2022) 14 capsule 0 Not Taking   Home medication reconciliation was completed with the patient.   Scheduled Inpatient Medications:    cetirizine  10 mg Oral QPM   cholecalciferol  1,000 Units Oral Daily   digoxin  125 mcg Oral Daily  diltiazem  180 mg Oral Daily   enoxaparin (LOVENOX) injection  0.5 mg/kg Subcutaneous Q24H   levothyroxine  175 mcg Oral Q0600   LORazepam  0.5 mg Intravenous Once   nebivolol  10 mg Oral Daily    Continuous Inpatient Infusions:    sodium chloride 100 mL/hr at 04/17/22 0122    PRN Inpatient Medications:  acetaminophen **OR** acetaminophen, magnesium hydroxide, morphine injection, ondansetron **OR** ondansetron (ZOFRAN) IV, traZODone  Family History: family history is not on file.  The patient's family history is negative for inflammatory bowel disorders, GI malignancy, or solid organ transplantation.  Social History:   reports that he quit smoking about 43 years ago. His smoking use included cigarettes. He has never used smokeless tobacco. He reports that he does not drink alcohol and does not use drugs. The patient denies ETOH, tobacco, or drug use.   Review of Systems: Constitutional: Weight is stable.  Eyes: No changes in vision. ENT: No oral lesions, sore throat.  GI: see HPI.  Heme/Lymph: No easy bruising.  CV: No chest pain.  GU: No hematuria.  Integumentary: No rashes.  Neuro: No headaches.  Psych: No depression/anxiety.  Endocrine: No  heat/cold intolerance.  Allergic/Immunologic: No urticaria.  Resp: No cough, SOB.  Musculoskeletal: No joint swelling.    Physical Examination: BP 117/60 (BP Location: Left Arm)   Pulse 69   Temp 98.1 F (36.7 C)   Resp 17   Ht _0  (1.727 m)   Wt 100 kg   SpO2 99%   BMI 33.52 kg/m  Gen: NAD, alert and oriented x 4 HEENT: PEERLA, EOMI, Neck: supple, no JVD or thyromegaly Chest: CTA bilaterally, no wheezes, crackles, or other adventitious sounds CV: RRR, no m/g/c/r Abd: soft, nondistended, normoactive bowel sounds in all four quadrants, no TTP in all four quadrants, negative Murphy's sign, no HSM, no rebound tenderness  Ext: no edema, well perfused with 2+ pulses, Skin: no rash or lesions noted Lymph: no LAD  Data: Lab Results  Component Value Date   WBC 20.7 (H) 04/17/2022   HGB 11.1 (L) 04/17/2022   HCT 33.7 (L) 04/17/2022   MCV 91.1 04/17/2022   PLT 180 04/17/2022   Recent Labs  Lab 04/16/22 1824 04/17/22 0417  HGB 12.6* 11.1*   Lab Results  Component Value Date   NA 131 (L) 04/17/2022   K 4.9 04/17/2022   CL 102 04/17/2022   CO2 21 (L) 04/17/2022   BUN 32 (H) 04/17/2022   CREATININE 1.90 (H) 04/17/2022   Lab Results  Component Value Date   ALT 412 (H) 04/17/2022   AST 413 (H) 04/17/2022   ALKPHOS 300 (H) 04/17/2022   BILITOT 2.5 (H) 04/17/2022   Recent Labs  Lab 04/16/22 1824  INR 1.2   HIDA 04/17/2022: IMPRESSION: Findings most consistent with distal common bile duct obstruction. Potential obstructing lesion identified on comparison CT. Consider MRCP for further evaluation versus ERCP for clearance of obstruction.   Filling of the gallbladder cannot be adequately assessed on the background of common bile duct obstruction.   CT abd/pelvis 04/16/2022: IMPRESSION: 1. No bowel obstruction. Normal appendix. 2. Noncalcified structure within the gallbladder as seen on the ultrasound. Mild biliary ductal dilatation. The presence of noncalcified  stone in the central CBD is not excluded. Evaluation is very limited due to respiratory motion. MRCP may provide better evaluation if clinically indicated. 3. Fatty liver. 4. Aortic Atherosclerosis (ICD10-I70.0).  RUQ [place] 04/16/2022: IMPRESSION: Non mobile echogenic focus within the gallbladder as  described. Follow-up can be performed as clinically indicated given the patient's advanced age.   Assessment/Plan:  88 y/o Caucasian male with a PMH of HTN, HLD, CKD Stage IIIb, CAD, SSS s/p PPM, T2DM, asthma, hypothyroidism, and obesity presented to the [facility] ED yesterday afternoon for chief complaint of tremors, nausea and vomiting found to have elevated LFTs mixed pattern with imaging concerns for distal CBD obstruction  Hyperbilirubinemia c/w biliary obstruction - laboratory and imaging studies concerning for distal CBD obstruction. DDx includes choledocholithiasis, cholangiocarcinoma, polyp, stricture, etc.   SSS s/p PPM - unable to perform MRCP here due to wandering leads   CKD Stage IIIb  Advanced age   Recommendations:  - Reviewed labs and imaging findings with patient and daughters in room - Etiology of CBD obstruction is unknown at this time but likely a stone or either a mass, polyp, stricture, etc  - Continue supportive care with pain control, antiemetics, and IV fluid hydration - Repeat serial abdominal examinations as patient is high-risk for development of ascending cholangitis (watch for fever, RUQ abd pain, AMS) - Continue IV antibiotics  - Monitor WBC and continue to trend LFTs - Management of comorbidities per primary team - General Surgery following along - Advise ERCP for further evaluation. Discussed plan of care with Dr. Allen Norris who is available Friday to do procedure. Discussed procedure details, indications, and potential risks and complications with patient and daughters in room.  - Heart healthy diet for now - GI will continue to follow along with you  I reviewed  the risks (including bleeding, perforation, infection, anesthesia complications, cardiac/respiratory complications, post-ERCP pancreatitis), benefits and alternatives of ERCP. Patient consents to proceed.    Thank you for the consult. Please call with questions or concerns.  Geanie Kenning, PA-C [facility] Gastroenterology [phone]

## 2022-04-17 NOTE — Assessment & Plan Note (Signed)
-   We will hold off statin therapy given elevated LFTs

## 2022-04-17 NOTE — ED Notes (Signed)
Hospitalist at bedside. PA for general surgery at bedside.

## 2022-04-17 NOTE — ED Provider Notes (Signed)
[facility] Provider Note   Event Date/Time   First MD Initiated Contact with Patient 04/16/22 1811     (approximate) History  Bradycardia  HPI Samuel Mack is a 88 y.o. male with a past medical history of sick sinus syndrome who presents for nausea/vomiting, generalized weakness, and bradycardia found by EMS.  Patient also endorses shaking intermittently that began today.  EMS state that throughout transport, patient had episodes of bradycardia to the 30s where patient became pale, diaphoretic, and lightheaded.  Patient has not had any further episodes after arrival. ROS: Patient currently denies any vision changes, tinnitus, difficulty speaking, facial droop, sore throat, chest pain, shortness of breath, abdominal pain, diarrhea, dysuria, or weakness/numbness/paresthesias in any extremity   Physical Exam  Triage Vital Signs: ED Triage Vitals  Enc Vitals Group     BP 04/16/22 1830 (!) 111/100     Pulse Rate 04/16/22 1818 94     Resp 04/16/22 1818 13     Temp 04/16/22 1818 98.4 F (36.9 C)     Temp Source 04/16/22 1818 Oral     SpO2 04/16/22 1830 100 %     Weight 04/16/22 1819 220 lb 7.4 oz (100 kg)     Height --      Head Circumference --      Peak Flow --      Pain Score 04/16/22 1819 0     Pain Loc --      Pain Edu? --      Excl. in [place]? --    Most recent vital signs: Vitals:   04/16/22 2200 04/16/22 2230  BP: 120/67 124/64  Pulse: 95 75  Resp: 16 (!) 23  Temp: 98 F (36.7 C)   SpO2: 91% 94%   General: Awake, oriented x4. CV:  Good peripheral perfusion.  Resp:  Normal effort.  Abd:  No distention.  Other:  Elderly overweight Caucasian male laying in bed in no acute distress ED Results / Procedures / Treatments  Labs (all labs ordered are listed, but only abnormal results are displayed) Labs Reviewed  COMPREHENSIVE METABOLIC PANEL - Abnormal; Notable for the following components:      Result Value   Glucose, Bld 132 (*)    BUN 30 (*)     Creatinine, Ser 1.66 (*)    AST 492 (*)    ALT 316 (*)    Alkaline Phosphatase 334 (*)    Total Bilirubin 2.1 (*)    GFR, Estimated 39 (*)    All other components within normal limits  CBC WITH DIFFERENTIAL/PLATELET - Abnormal; Notable for the following components:   WBC 15.8 (*)    Hemoglobin 12.6 (*)    Neutro Abs 12.6 (*)    All other components within normal limits  BRAIN NATRIURETIC PEPTIDE - Abnormal; Notable for the following components:   B Natriuretic Peptide 175.7 (*)    All other components within normal limits  PROTIME-INR - Abnormal; Notable for the following components:   Prothrombin Time 15.5 (*)    All other components within normal limits  URINALYSIS, ROUTINE W REFLEX MICROSCOPIC  TROPONIN I (HIGH SENSITIVITY)  TROPONIN I (HIGH SENSITIVITY)   EKG ED ECG REPORT I, Naaman Plummer, the attending physician, personally viewed and interpreted this ECG. Date: 04/17/2022 EKG Time: 1818 Rate: 95 Rhythm: Paced QRS Axis: normal Intervals: normal ST/T Wave abnormalities: normal Narrative Interpretation: Paced rhythm.  No evidence of acute ischemia RADIOLOGY ED MD interpretation: CT of the abdomen  pelvis with IV contrast interpreted by me shows no bowel obstruction, normal appendix, noncalcified structure within the gallbladder seen on ultrasound with mild biliary ductal dilation -Agree with radiology assessment Official radiology report(s): CT Abdomen Pelvis W Contrast  Result Date: 04/16/2022 CLINICAL DATA:  Abdominal pain, nausea vomiting. EXAM: CT ABDOMEN AND PELVIS WITH CONTRAST TECHNIQUE: Multidetector CT imaging of the abdomen and pelvis was performed using the standard protocol following bolus administration of intravenous contrast. RADIATION DOSE REDUCTION: This exam was performed according to the departmental dose-optimization program which includes automated exposure control, adjustment of the mA and/or kV according to patient size and/or use of iterative  reconstruction technique. CONTRAST:  48m OMNIPAQUE IOHEXOL 300 MG/ML  SOLN COMPARISON:  Right upper quadrant ultrasound dated 04/16/2022. FINDINGS: Evaluation of this exam is limited due to respiratory motion artifact. Lower chest: Minimal bibasilar subpleural atelectasis. The visualized lung bases are otherwise clear. There is coronary vascular calcification. Cardiac pacemaker wires noted. No intra-abdominal free air or free fluid. Hepatobiliary: Fatty liver. There is mild intrahepatic biliary ductal dilatation. A noncalcified rounded density within the gallbladder corresponds to the structure seen on the ultrasound. No pericholecystic fluid. Noncalcified stones may be present in the central CBD (coronal 46/5) but not evaluated on this CT. MRCP may provide better evaluation if clinically indicated. Pancreas: Moderately atrophic. No active inflammatory changes. No dilatation of the main pancreatic duct. Spleen: Normal in size without focal abnormality. Adrenals/Urinary Tract: The adrenal glands unremarkable. There is no hydronephrosis on either side. There is symmetric enhancement and excretion of contrast by both kidneys. Bilateral renal cysts measure up to 5.5 cm in the inferior pole of the left kidney. Several smaller hypodense lesions are not characterized. The visualized ureters and urinary bladder appear unremarkable. Stomach/Bowel: Moderate stool throughout the colon. There is no bowel obstruction or active inflammation. Normal appendix. Vascular/Lymphatic: Advanced aortoiliac atherosclerotic disease. The IVC is unremarkable. No portal venous gas. There is no adenopathy. Reproductive: The prostate and seminal vesicles are grossly unremarkable. No pelvic mass. Other: None Musculoskeletal: Osteopenia with degenerative changes of the spine. No acute osseous pathology. IMPRESSION: 1. No bowel obstruction. Normal appendix. 2. Noncalcified structure within the gallbladder as seen on the ultrasound. Mild biliary  ductal dilatation. The presence of noncalcified stone in the central CBD is not excluded. Evaluation is very limited due to respiratory motion. MRCP may provide better evaluation if clinically indicated. 3. Fatty liver. 4. Aortic Atherosclerosis (ICD10-I70.0). Electronically Signed   By: AAnner CreteM.D.   On: 04/16/2022 23:10   [place]dom[place] Limited RUQ (LIVER/GB)  Result Date: 04/16/2022 CLINICAL DATA:  Elevated LFTs EXAM: ULTRASOUND ABDOMEN LIMITED RIGHT UPPER QUADRANT COMPARISON:  None Available. FINDINGS: Gallbladder: Gallbladder is well distended with a 1.2 cm echogenic focus which is non mobile near the gallbladder neck. This may represent a sludge ball although polyp would deserve consideration as well. No cholelithiasis is noted. Common bile duct: Diameter: 4.6 mm. Liver: No focal lesion identified. Within normal limits in parenchymal echogenicity. Portal vein is patent on color Doppler imaging with normal direction of blood flow towards the liver. Other: None. IMPRESSION: Non mobile echogenic focus within the gallbladder as described. Follow-up can be performed as clinically indicated given the patient's advanced age. Electronically Signed   By: MInez CatalinaM.D.   On: 04/16/2022 20:26   DG Chest Port 1 View  Result Date: 04/16/2022 CLINICAL DATA:  Pt brought in by EMS today for shaking that started today. EMS sts that they put pt on the monitor  and noticed that his HR is ranging from 30-100 bpm. Pt has a pacemaker that was replaced about four years ago. Per EMS pt is having long sinus pauses. EXAM: PORTABLE CHEST - 1 VIEW COMPARISON:  05/10/2019 FINDINGS: Relatively low lung volumes with crowding of bronchovascular structures at the lung bases. No definite confluent airspace disease. Heart size and mediastinal contours are within normal limits. Aortic Atherosclerosis (ICD10-170.0). Stable left subclavian transvenous pacemaker. No effusion. Visualized bones unremarkable. IMPRESSION: Low volumes  with bibasilar opacities.  No definite acute disease. Electronically Signed   By: Lucrezia Europe M.D.   On: 04/16/2022 18:36   PROCEDURES: Critical Care performed: No .1-3 Lead EKG Interpretation  Performed by: Naaman Plummer, MD Authorized by: Naaman Plummer, MD     Interpretation: normal     ECG rate:  74   ECG rate assessment: normal     Rhythm: sinus rhythm     Ectopy: none     Conduction: normal    MEDICATIONS ORDERED IN ED: Medications  LORazepam (ATIVAN) injection 0.5 mg (0.5 mg Intravenous Patient Refused/Not Given 04/17/22 0020)  sodium chloride 0.9 % bolus 1,000 mL (1,000 mLs Intravenous Bolus 04/16/22 2315)  iohexol (OMNIPAQUE) 300 MG/ML solution 75 mL (75 mLs Intravenous Contrast Given 04/16/22 2242)  piperacillin-tazobactam (ZOSYN) IVPB 3.375 g (3.375 g Intravenous New Bag/Given 04/16/22 2350)   IMPRESSION / MDM / ASSESSMENT AND PLAN / ED COURSE  I reviewed the triage vital signs and the nursing notes.                             Differential diagnosis includes, but is not limited to, pacemaker malfunction, ACS, gastroenteritis, bowel obstruction, gallbladder disease The patient is on the cardiac monitor to evaluate for evidence of arrhythmia and/or significant heart rate changes. Patient's presentation is most consistent with acute presentation with potential threat to life or bodily function. Patient is a 88 year old male with the above-stated past medical history who presents for nausea/vomiting/shaking as well as bradycardia that was found during transport.  Patient's EKG and interrogation of his pacemaker do not show any abnormalities.  Patient does however have significantly elevated transaminitis, hyperbilirubinemia, and right upper quadrant ultrasound showing likely mass in the gallbladder.  Patient also has leukocytosis to 16 will be treated empirically with Zosyn for possible cholecystitis.  I spoke to Dr. Dahlia Byes in general surgery who recommends CT of the abdomen and pelvis  with IV contrast to further evaluate for possible malignancy versus other biliary malignancy.  Patient CT showed similar mass within the gallbladder and recommended MRCP.  MRCP was ordered however patient does have pacemaker that is noncompatible with our MRI machine.  Therefore patient will require a HIDA scan in the morning for evaluation of his biliary tract.  I spoke to the hospitalist service for admission who agrees with admission for further evaluation and management.  Dispo: Admit to medicine   FINAL CLINICAL IMPRESSION(S) / ED DIAGNOSES   Final diagnoses:  Transaminitis  Mass of gallbladder  Nausea and vomiting, unspecified vomiting type   Rx / DC Orders   ED Discharge Orders     None      Note:  This document was prepared using Dragon voice recognition software and may include unintentional dictation errors.   Naaman Plummer, MD 04/17/22 [phone]

## 2022-04-17 NOTE — Telephone Encounter (Signed)
04/17/22~Rcvd call from [facility]-ED MD/ [name] for STAT MRI to be done @ [facility], per pt has PACEMAKER. S/W Albina Billet @ [facility]/will review & see if coord for programing can be done if device is MRI Compat & she will send Secure chat to ED MD. Ref MD adv. MF

## 2022-04-17 NOTE — H&P (Addendum)
Samuel Mack   PATIENT NAME: Samuel Mack    MR#:  [id number]  DATE OF BIRTH:  Aug 06, 1930  DATE OF ADMISSION:  04/16/2022  PRIMARY CARE PHYSICIAN: Casilda Carls, MD   Patient is coming from: Home  REQUESTING/REFERRING PHYSICIAN: Valora Piccolo, MD  CHIEF COMPLAINT:   Chief Complaint  Patient presents with   Bradycardia    HISTORY OF PRESENT ILLNESS:  Samuel Mack is a 88 y.o. Caucasian male with medical history significant for hypertension, dyslipidemia, CKD 3B, hypothyroidism, coronary artery disease, SSS s/p permanent pacemaker placement, type 2 diabetes mellitus and asthma who presented to the ER with acute onset of recurrent nausea and vomiting as well as generalized weakness.  The patient was noted to be bradycardic by EMS.  She was intermittently shaking and became pale and diaphoretic with lightheadedness.  No syncope and she has not had any recurrence in the ER.  No paresthesias or focal muscle weakness.  No dysphagia or dysarthria.  No chest pain or palpitations.  No cough or wheezing or hemoptysis.  No dysuria, oliguria or hematuria or flank pain.  No bleeding diathesis.  No tinnitus or vertigo.  The patient's pacemaker was apparently capturing initially.  Contact with [employer] was made and his pacemaker was adjusted.  Since then the patient has not had any recurrent bradycardia.  ED Course: When the patient came to the ER, BP was 111/100, with otherwise normal vital signs.  Later on respiratory rate was 28.  Labs revealed borderline potassium of 5.1 and a BUN of 30 with creatinine 1.66, alk phos 334, AST 492 and ALT 316 with total bili of 2.1.  BNP was 175.7 and high sensitive troponin I was 11 and later 13.  CBC showed leukocytosis of 15.8.  PT was 16.5 and INR 1.2  EKG as reviewed by me : EKG showed paced rhythm with a rate of 95 Imaging: Right upper quadrant ultrasound revealed nonmobile echogenic focus within the gallbladder. Abdominal pelvic CT scan showed the  following: 1. No bowel obstruction. Normal appendix. 2. Noncalcified structure within the gallbladder as seen on the ultrasound. Mild biliary ductal dilatation. The presence of noncalcified stone in the central CBD is not excluded. Evaluation is very limited due to respiratory motion. MRCP may provide better evaluation if clinically indicated. 3. Fatty liver. 4. Aortic Atherosclerosis.  The patient was given IV Zosyn, 1 L bolus of IV normal saline and 0.5 mg of IV Ativan.  He will be admitted to a medical telemetry bed for further evaluation and management.   PAST MEDICAL HISTORY:   Past Medical History:  Diagnosis Date   Asthma    Cancer ([place])    skin   Complication of anesthesia    previous pacemaker patient could feel everything   Diabetes mellitus without complication ([place])    diet controlled   Dyspnea    Dysrhythmia    Elevated lipids    Hypertension    Hypothyroidism    Myocardial infarction ([place])    Presence of permanent cardiac pacemaker   Stage IIIb chronic kidney disease  PAST SURGICAL HISTORY:   Past Surgical History:  Procedure Laterality Date   CATARACT EXTRACTION W/PHACO Left 12/11/2016   Procedure: CATARACT EXTRACTION PHACO AND INTRAOCULAR LENS PLACEMENT ([place]);  Surgeon: Leandrew Koyanagi, MD;  Location: [place];  Service: Ophthalmology;  Laterality: Left;  IVA TOPICAL LEFT   EYE SURGERY Left    cataract   NASAL SINUS SURGERY     PACEMAKER GENERATOR  CHANGE Left    x2   PACEMAKER INSERTION  2002   PACEMAKER INSERTION Left 05/12/2019   Procedure: INSERTION PACEMAKER, LEFT;  Surgeon: Cletis Athens, MD;  Location: [facility];  Service: Cardiovascular;  Laterality: Left;   PROSTATE SURGERY     TRANSURETHRAL RESECTION OF PROSTATE N/A 06/20/2020   Procedure: TRANSURETHRAL RESECTION OF THE PROSTATE (TURP);  Surgeon: [name], MD;  Location: [facility];  Service: Urology;  Laterality: N/A;    SOCIAL HISTORY:   Social History   Tobacco Use    Smoking status: Former    Types: Cigarettes    Quit date: 01/26/1979    Years since quitting: 43.2   Smokeless tobacco: Never  Substance Use Topics   Alcohol use: No    FAMILY HISTORY:  Positive for CVA in his father died at 82.  DRUG ALLERGIES:   Allergies  Allergen Reactions   Bee Venom Swelling   Penicillins Swelling    Patient reports he swells and it was "something in the pcn" 50 year ago    REVIEW OF SYSTEMS:   ROS As per history of present illness. All pertinent systems were reviewed above. Constitutional, HEENT, cardiovascular, respiratory, GI, GU, musculoskeletal, neuro, psychiatric, endocrine, integumentary and hematologic systems were reviewed and are otherwise negative/unremarkable except for positive findings mentioned above in the HPI.   MEDICATIONS AT HOME:   Prior to Admission medications   Medication Sig Start Date End Date Taking? Authorizing Provider  aspirin 81 MG tablet Take 81 mg by mouth daily.   Yes [provider]  atorvastatin (LIPITOR) 10 MG tablet Take 10 mg by mouth at bedtime.   Yes [provider]  cholecalciferol (VITAMIN D) 25 MCG (1000 UNIT) tablet Take 1,000 Units by mouth in the morning and at bedtime.   Yes [provider]  Coenzyme Q10 100 MG TABS Take 100 mg by mouth daily.    Yes [provider]  digoxin (LANOXIN) 0.125 MG tablet TAKE 1 TABLET BY MOUTH EVERY DAY 03/12/22  Yes Masoud, Viann Shove, MD  diltiazem (DILACOR XR) 180 MG 24 hr capsule Take 180 mg by mouth daily.   Yes [provider]  levocetirizine (XYZAL) 5 MG tablet Take 5 mg by mouth every evening.   Yes [provider]  levothyroxine (SYNTHROID) 175 MCG tablet Take 175 mcg by mouth daily before breakfast.    Yes [provider]  losartan (COZAAR) 100 MG tablet Take 100 mg by mouth daily. 06/08/20  Yes [provider]  Multiple Vitamins-Minerals (PRESERVISION AREDS PO) Take by mouth 2 (two) times daily.   Yes  [provider]  nebivolol (BYSTOLIC) 10 MG tablet Take 10 mg by mouth daily.   Yes [provider]  Omega-3 Fatty Acids (FISH OIL) 1200 MG CAPS Take 1,200 mg by mouth 3 (three) times daily.   Yes [provider]  potassium gluconate 595 (99 K) MG TABS tablet Take 595 mg by mouth daily.   Yes [provider]  spironolactone (ALDACTONE) 25 MG tablet Take 25 mg by mouth daily.   Yes [provider]  vitamin B-12 (CYANOCOBALAMIN) 1000 MCG tablet Take 1,000 mcg by mouth daily.   Yes [provider]  vitamin C (ASCORBIC ACID) 500 MG tablet Take 500 mg by mouth daily.   Yes [provider]  vitamin E 180 MG (400 UNITS) capsule Take 400 Units by mouth daily.   Yes [provider]  acetaminophen (TYLENOL 8 HOUR) 650 MG CR tablet Take  1 tablet (650 mg total) by mouth every 8 (eight) hours as needed for pain. 02/08/15   Cletis Athens, MD  nitrofurantoin, macrocrystal-monohydrate, (MACROBID) 100 MG capsule Take 1 capsule (100 mg total) by mouth 2 (two) times daily. Patient not taking: Reported on 04/16/2022 06/16/20   Zara Council A, PA-C      VITAL SIGNS:  Blood pressure 124/64, pulse 75, temperature 98.1 F (36.7 C), temperature source Oral, resp. rate (!) 23, weight 100 kg, SpO2 94 %.  PHYSICAL EXAMINATION:  Physical Exam  GENERAL:  88 y.o.-year-old Caucasian male patient lying in the bed with no acute distress.  EYES: Pupils equal, round, reactive to light and accommodation. No scleral icterus. Extraocular muscles intact.  HEENT: Head atraumatic, normocephalic. Oropharynx and nasopharynx clear.  NECK:  Supple, no jugular venous distention. No thyroid enlargement, no tenderness.  LUNGS: Normal breath sounds bilaterally, no wheezing, rales,rhonchi or crepitation. No use of accessory muscles of respiration.  CARDIOVASCULAR: Regular rate and rhythm, S1, S2 normal. No murmurs, rubs, or gallops.  ABDOMEN: Soft, nondistended,  nontender. Bowel sounds present. No organomegaly or mass.  EXTREMITIES: No pedal edema, cyanosis, or clubbing.  NEUROLOGIC: Cranial nerves II through XII are intact. Muscle strength 5/5 in all extremities. Sensation intact. Gait not checked.  PSYCHIATRIC: The patient is alert and oriented x 3.  Normal affect and good eye contact. SKIN: No obvious rash, lesion, or ulcer.   LABORATORY PANEL:   CBC Recent Labs  Lab 04/16/22 1824  WBC 15.8*  HGB 12.6*  HCT 39.5  PLT 252   ------------------------------------------------------------------------------------------------------------------  Chemistries  Recent Labs  Lab 04/16/22 1824  NA 135  K 5.1  CL 99  CO2 24  GLUCOSE 132*  BUN 30*  CREATININE 1.66*  CALCIUM 9.3  AST 492*  ALT 316*  ALKPHOS 334*  BILITOT 2.1*   ------------------------------------------------------------------------------------------------------------------  Cardiac Enzymes No results for input(s): "TROPONINI" in the last 168 hours. ------------------------------------------------------------------------------------------------------------------  RADIOLOGY:  CT Abdomen Pelvis W Contrast  Result Date: 04/16/2022 CLINICAL DATA:  Abdominal pain, nausea vomiting. EXAM: CT ABDOMEN AND PELVIS WITH CONTRAST TECHNIQUE: Multidetector CT imaging of the abdomen and pelvis was performed using the standard protocol following bolus administration of intravenous contrast. RADIATION DOSE REDUCTION: This exam was performed according to the departmental dose-optimization program which includes automated exposure control, adjustment of the mA and/or kV according to patient size and/or use of iterative reconstruction technique. CONTRAST:  62m OMNIPAQUE IOHEXOL 300 MG/ML  SOLN COMPARISON:  Right upper quadrant ultrasound dated 04/16/2022. FINDINGS: Evaluation of this exam is limited due to respiratory motion artifact. Lower chest: Minimal bibasilar subpleural atelectasis. The  visualized lung bases are otherwise clear. There is coronary vascular calcification. Cardiac pacemaker wires noted. No intra-abdominal free air or free fluid. Hepatobiliary: Fatty liver. There is mild intrahepatic biliary ductal dilatation. A noncalcified rounded density within the gallbladder corresponds to the structure seen on the ultrasound. No pericholecystic fluid. Noncalcified stones may be present in the central CBD (coronal 46/5) but not evaluated on this CT. MRCP may provide better evaluation if clinically indicated. Pancreas: Moderately atrophic. No active inflammatory changes. No dilatation of the main pancreatic duct. Spleen: Normal in size without focal abnormality. Adrenals/Urinary Tract: The adrenal glands unremarkable. There is no hydronephrosis on either side. There is symmetric enhancement and excretion of contrast by both kidneys. Bilateral renal cysts measure up to 5.5 cm in the inferior pole of the left kidney. Several smaller hypodense lesions are not characterized. The visualized ureters and urinary bladder appear unremarkable.  Stomach/Bowel: Moderate stool throughout the colon. There is no bowel obstruction or active inflammation. Normal appendix. Vascular/Lymphatic: Advanced aortoiliac atherosclerotic disease. The IVC is unremarkable. No portal venous gas. There is no adenopathy. Reproductive: The prostate and seminal vesicles are grossly unremarkable. No pelvic mass. Other: None Musculoskeletal: Osteopenia with degenerative changes of the spine. No acute osseous pathology. IMPRESSION: 1. No bowel obstruction. Normal appendix. 2. Noncalcified structure within the gallbladder as seen on the ultrasound. Mild biliary ductal dilatation. The presence of noncalcified stone in the central CBD is not excluded. Evaluation is very limited due to respiratory motion. MRCP may provide better evaluation if clinically indicated. 3. Fatty liver. 4. Aortic Atherosclerosis (ICD10-I70.0). Electronically  Signed   By: Anner Crete M.D.   On: 04/16/2022 23:10   US Abdomen Limited RUQ (LIVER/GB)  Result Date: 04/16/2022 CLINICAL DATA:  Elevated LFTs EXAM: ULTRASOUND ABDOMEN LIMITED RIGHT UPPER QUADRANT COMPARISON:  None Available. FINDINGS: Gallbladder: Gallbladder is well distended with a 1.2 cm echogenic focus which is non mobile near the gallbladder neck. This may represent a sludge ball although polyp would deserve consideration as well. No cholelithiasis is noted. Common bile duct: Diameter: 4.6 mm. Liver: No focal lesion identified. Within normal limits in parenchymal echogenicity. Portal vein is patent on color Doppler imaging with normal direction of blood flow towards the liver. Other: None. IMPRESSION: Non mobile echogenic focus within the gallbladder as described. Follow-up can be performed as clinically indicated given the patient's advanced age. Electronically Signed   By: Inez Catalina M.D.   On: 04/16/2022 20:26   DG Chest Port 1 View  Result Date: 04/16/2022 CLINICAL DATA:  Pt brought in by EMS today for shaking that started today. EMS sts that they put pt on the monitor and noticed that his HR is ranging from 30-100 bpm. Pt has a pacemaker that was replaced about four years ago. Per EMS pt is having long sinus pauses. EXAM: PORTABLE CHEST - 1 VIEW COMPARISON:  05/10/2019 FINDINGS: Relatively low lung volumes with crowding of bronchovascular structures at the lung bases. No definite confluent airspace disease. Heart size and mediastinal contours are within normal limits. Aortic Atherosclerosis (ICD10-170.0). Stable left subclavian transvenous pacemaker. No effusion. Visualized bones unremarkable. IMPRESSION: Low volumes with bibasilar opacities.  No definite acute disease. Electronically Signed   By: Lucrezia Europe M.D.   On: 04/16/2022 18:36      IMPRESSION AND PLAN:  Assessment and Plan: * Elevated LFTs - This could be related to cholelithiasis or gallbladder mass. - The patient will be  admitted to a medical telemetry bed. - HIDA scan will be obtained in a.m. - MRCP could not be obtained as the patient has a pacemaker. - Pain management will be provided. - We will follow CMP with hydration. - General surgery consult will be obtained. - Dr. Dahlia Byes was notified and is aware about the patient.  Sinus bradycardia - This was apparently related to his pacemaker malfunction and exacerbated by recurrent nausea and vomiting.  Hypothyroidism - We will continue Synthroid.  Essential hypertension - We will continue his antihypertensives  Dyslipidemia - We will hold off statin therapy given elevated LFTs  Stage III chronic kidney disease ([place]) - She has stage IIIb chronic kidney disease that is fairly stable.   DVT prophylaxis: Lovenox.  Advanced Care Planning:  Code Status: full code.  Family Communication:  The plan of care was discussed in details with the patient (and family). I answered all questions. The patient agreed to proceed with  the above mentioned plan. Further management will depend upon hospital course. Disposition Plan: Back to previous home environment Consults called: General surgery consult. All the records are reviewed and case discussed with ED provider.  Status is: Inpatient  At the time of the admission, it appears that the appropriate admission status for this patient is inpatient.  This is judged to be reasonable and necessary in order to provide the required intensity of service to ensure the patient's safety given the presenting symptoms, physical exam findings and initial radiographic and laboratory data in the context of comorbid conditions.  The patient requires inpatient status due to high intensity of service, high risk of further deterioration and high frequency of surveillance required.  I certify that at the time of admission, it is my clinical judgment that the patient will require inpatient hospital care extending more than 2 midnights.                             Dispo: The patient is from: Home              Anticipated d/c is to: Home              Patient currently is not medically stable to d/c.              Difficult to place patient: No  Christel Mormon M.D on 04/17/2022 at 2:35 AM  [facility]   From 7 PM-7 AM, contact night-coverage [email or web address]  CC: Primary care physician; Casilda Carls, MD

## 2022-04-17 NOTE — Assessment & Plan Note (Signed)
-   We will continue his antihypertensives. 

## 2022-04-17 NOTE — Progress Notes (Signed)
Anticoagulation monitoring(Lovenox):  88 yo male ordered Lovenox 40 mg Q24h    Filed Weights   04/16/22 1819  Weight: 100 kg (220 lb 7.4 oz)   BMI 35   Lab Results  Component Value Date   CREATININE 1.66 (H) 04/16/2022   CREATININE 1.51 (H) 06/16/2020   CREATININE 1.78 (H) 05/10/2019   Estimated Creatinine Clearance: 33.9 mL/min (A) (by C-G formula based on SCr of 1.66 mg/dL (H)). Hemoglobin & Hematocrit     Component Value Date/Time   HGB 12.6 (L) 04/16/2022 1824   HCT 39.5 04/16/2022 1824     Per Protocol for Patient with estCrcl > 30 ml/min and BMI > 30, will transition to Lovenox 50 mg Q24h.

## 2022-04-17 NOTE — Progress Notes (Addendum)
PHARMACIST - PHYSICIAN ORDER COMMUNICATION  CONCERNING: P&T Medication Policy on Herbal Medications  DESCRIPTION:  This patient's order for:  Co Q-10, Potassium gluconate  has been noted.  This product(s) is classified as an "herbal" or natural product. Due to a lack of definitive safety studies or FDA approval, nonstandard manufacturing practices, plus the potential risk of unknown drug-drug interactions while on inpatient medications, the Pharmacy and Therapeutics Committee does not permit the use of "herbal" or natural products of this type within [facility].   ACTION TAKEN: The pharmacy department is unable to verify this order at this time and your patient has been informed of this safety policy. Please reevaluate patient's clinical condition at discharge and address if the herbal or natural product(s) should be resumed at that time.

## 2022-04-17 NOTE — Assessment & Plan Note (Addendum)
-   This could be related to cholelithiasis or gallbladder mass. - The patient will be admitted to a medical telemetry bed. - HIDA scan will be obtained in a.m. - MRCP could not be obtained as the patient has a pacemaker. - Pain management will be provided. - We will follow CMP with hydration. - General surgery consult will be obtained. - Dr. Dahlia Byes was notified and is aware about the patient.

## 2022-04-17 NOTE — Progress Notes (Signed)
    SHORT PROGRESS NOTE   Subjective:  This is a 88 year old male with CKD 3B, hypothyroidism, coronary artery disease, sick sinus syndrome with pacemaker, diabetes mellitus type 2, asthma and hypertension.  The patient presented to the hospital for nausea vomiting and "shaking.  EMS noted that he was bradycardic with shaking and diaphoresis. According to the HPI, the patient had a problem with his pacemaker which was adjusted and subsequently did not have any issues with bradycardia In the ED, lab work revealed elevated LFTs, WBC count of 15.8. And pelvis with contrast:Noncalcified structure within the gallbladder as seen on the ultrasound. Mild biliary ductal dilatation and fatty liver Assessment and Plan: Principal Problem:   Elevated LFTs -he tells me today that he has had vomiting after breakfast every day for about 3 weeks now. - HIDA scan is positive for an obstruction in the distal CBD - Plan for ERCP on Friday -WBC increased from 15.8-20.7 today and LFTs have not improved -Continue Zosyn  Active Problems:   Sinus bradycardia -Resolved - Have asked cardiology to evaluate him    Hypothyroidism -Continue Synthroid  Hydration Essential hypertension -Hold Aldactone  - Continue Cardizem and Bystolic    Dyslipidemia -Lipitor for now    Stage IIIb chronic kidney disease ([place]) -Creatinine slightly worse from admission-1.66> 1.90 - Aldactone held    Debbe Odea, MD 04/17/2022 [facility]

## 2022-04-17 NOTE — Consult Note (Addendum)
Cardiology Consultation:   Patient ID: Samuel Mack MRN: [id number]; DOB: June 18, 1930  Admit date: 04/16/2022 Date of Consult: 04/17/2022  PCP:  Casilda Carls, MD   [facility] Providers Cardiologist:  None   {  Patient Profile:   Samuel Mack is a 88 y.o. male with a hx of obesity, diabetes, Afib, tachybrady syndrome s/p PPM who is being seen 04/17/2022 for the evaluation of pacemaker issues at the request of Dr. Wynelle Cleveland.  History of Present Illness:   Samuel Mack has not been seen by [facility] group in the past. He has a h/o pacemaker implantation for tachybrady syndrome followed by Dr. Lavera Guise. Appears he had a generator change out in 2020. Appears he had an echo in 2016 showing LVEF 55-60%, mild LVH, mild to mod MR, severely dilated LA and mildly dilated RA.   He presented to the ER 8/2 for shakiness. Family noted he was shaking and EMS was called. They also noted nasuea and vomiting. EMS noted him to be bradycardic with drops in his heart rate. Also had diaphoresis and lightheadedness. No syncope. No chest pain or palpitations.   In the ER BP 111/100, RR 28. Labs showed K 5.1, Scr 1.66, BUN 30, alk phos 334, AST 492, ALT 316, total bili 2.1. BNP 175.7, HS trop 11>13, WBC 15.8, INR 1.2. CXR nonacute. RUQ US showed non mobile echogenic focus within the gallbladder. CTA abdomen/pelvis showed no bowel obstruction, noncalcified structure in the gallbladder, recommended MRCP.The patient was given IV abx, IV NS and admitted. He was admitted for further work-up.    Past Medical History:  Diagnosis Date   Asthma    Cancer ([place])    skin   Complication of anesthesia    previous pacemaker patient could feel everything   Diabetes mellitus without complication ([place])    diet controlled   Dyspnea    Dysrhythmia    Elevated lipids    Hypertension    Hypothyroidism    Myocardial infarction [facility])    Presence of permanent cardiac pacemaker     Past Surgical History:  Procedure  Laterality Date   CATARACT EXTRACTION W/PHACO Left 12/11/2016   Procedure: CATARACT EXTRACTION PHACO AND INTRAOCULAR LENS PLACEMENT ([place]);  Surgeon: Leandrew Koyanagi, MD;  Location: [facility];  Service: Ophthalmology;  Laterality: Left;  IVA TOPICAL LEFT   EYE SURGERY Left    cataract   NASAL SINUS SURGERY     PACEMAKER GENERATOR CHANGE Left    x2   PACEMAKER INSERTION  2002   PACEMAKER INSERTION Left 05/12/2019   Procedure: INSERTION PACEMAKER, LEFT;  Surgeon: Cletis Athens, MD;  Location: [facility] OR[facility];  Service: Cardiovascular;  Laterality: Left;   PROSTATE SURGERY     TRANSURETHRAL RESECTION OF PROSTATE N/A 06/20/2020   Procedure: TRANSURETHRAL RESECTION OF THE PROSTATE (TURP);  Surgeon: [name], MD;  Location: [facility];  Service: Urology;  Laterality: N/A;     Home Medications:  Prior to Admission medications   Medication Sig Start Date End Date Taking? Authorizing Provider  aspirin 81 MG tablet Take 81 mg by mouth daily.   Yes [provider]  atorvastatin (LIPITOR) 10 MG tablet Take 10 mg by mouth at bedtime.   Yes [provider]  cholecalciferol (VITAMIN D) 25 MCG (1000 UNIT) tablet Take 1,000 Units by mouth in the morning and at bedtime.   Yes [provider]  Coenzyme Q10 100 MG TABS Take 100 mg by mouth daily.    Yes [provider]  digoxin (LANOXIN) 0.125 MG tablet TAKE 1 TABLET BY MOUTH EVERY DAY 03/12/22  Yes Masoud, Viann Shove, MD  diltiazem (DILACOR XR) 180 MG 24 hr capsule Take 180 mg by mouth daily.   Yes [provider]  levocetirizine (XYZAL) 5 MG tablet Take 5 mg by mouth every evening.   Yes [provider]  levothyroxine (SYNTHROID) 175 MCG tablet Take 175 mcg by mouth daily before breakfast.    Yes [provider]  losartan (COZAAR) 100 MG tablet Take 100 mg by mouth daily. 06/08/20  Yes [provider]  Multiple Vitamins-Minerals (PRESERVISION AREDS PO) Take by mouth 2 (two) times  daily.   Yes [provider]  nebivolol (BYSTOLIC) 10 MG tablet Take 10 mg by mouth daily.   Yes [provider]  Omega-3 Fatty Acids (FISH OIL) 1200 MG CAPS Take 1,200 mg by mouth 3 (three) times daily.   Yes [provider]  potassium gluconate 595 (99 K) MG TABS tablet Take 595 mg by mouth daily.   Yes [provider]  spironolactone (ALDACTONE) 25 MG tablet Take 25 mg by mouth daily.   Yes [provider]  vitamin B-12 (CYANOCOBALAMIN) 1000 MCG tablet Take 1,000 mcg by mouth daily.   Yes [provider]  vitamin C (ASCORBIC ACID) 500 MG tablet Take 500 mg by mouth daily.   Yes [provider]  vitamin E 180 MG (400 UNITS) capsule Take 400 Units by mouth daily.   Yes [provider]  acetaminophen (TYLENOL 8 HOUR) 650 MG CR tablet Take 1 tablet (650 mg total) by mouth every 8 (eight) hours as needed for pain. 02/08/15   Cletis Athens, MD  nitrofurantoin, macrocrystal-monohydrate, (MACROBID) 100 MG capsule Take 1 capsule (100 mg total) by mouth 2 (two) times daily. Patient not taking: Reported on 04/16/2022 06/16/20   Nori Riis, PA-C    Inpatient Medications: Scheduled Meds:  cetirizine  10 mg Oral QPM   cholecalciferol  1,000 Units Oral Daily   digoxin  125 mcg Oral Daily   diltiazem  180 mg Oral Daily   enoxaparin (LOVENOX) injection  0.5 mg/kg Subcutaneous Q24H   levothyroxine  175 mcg Oral Q0600   LORazepam  0.5 mg Intravenous Once   nebivolol  10 mg Oral Daily   Continuous Infusions:  sodium chloride 100 mL/hr at 04/17/22 0122   PRN Meds: acetaminophen **OR** acetaminophen, magnesium hydroxide, morphine injection, ondansetron **OR** ondansetron (ZOFRAN) IV, traZODone  Allergies:    Allergies  Allergen Reactions   Bee Venom Swelling   Penicillins Swelling    Patient reports he swells and it was "something in the pcn" 50 year ago    Social History:   Social History   Socioeconomic History    Marital status: Widowed    Spouse name: Not on file   Number of children: Not on file   Years of education: Not on file   Highest education level: Not on file  Occupational History   Not on file  Tobacco Use   Smoking status: Former    Types: Cigarettes    Quit date: 01/26/1979    Years since quitting: 43.2   Smokeless tobacco: Never  Vaping Use   Vaping Use: Never used  Substance and Sexual Activity   Alcohol use: No   Drug use: No   Sexual activity: Not on file  Other Topics Concern   Not on file  Social History Narrative   Not on file   Social  Determinants of Health   Financial Resource Strain: Not on file  Food Insecurity: Not on file  Transportation Needs: Not on file  Physical Activity: Not on file  Stress: Not on file  Social Connections: Not on file  Intimate Partner Violence: Not on file    Family History:   History reviewed. No pertinent family history.   ROS:  Please see the history of present illness.   All other ROS reviewed and negative.     Physical Exam/Data:   Vitals:   04/17/22 0800 04/17/22 0800 04/17/22 0900 04/17/22 1000  BP: (!) 113/54  (!) 107/54 (!) 112/55  Pulse:  71 69 71  Resp:  15 18 (!) 29  Temp:  98 F (36.7 C)    TempSrc:  Oral    SpO2:  97% 94% 98%  Weight:      Height:  _0  (1.727 m)      Intake/Output Summary (Last 24 hours) at 04/17/2022 1124 Last data filed at 04/17/2022 1052 Gross per 24 hour  Intake --  Output 200 ml  Net -200 ml      04/16/2022    6:19 PM 01/21/2022    9:19 AM 10/22/2021    9:34 AM  Last 3 Weights  Weight (lbs) 220 lb 7.4 oz 236 lb 236 lb  Weight (kg) 100 kg 107.049 kg 107.049 kg     Body mass index is 33.52 kg/m.  General:  Well nourished, well developed, in no acute distress HEENT: normal Neck: no JVD Vascular: No carotid bruits; Distal pulses 2+ bilaterally Cardiac:  normal S1, S2; RRR; no murmur  Lungs:  clear to auscultation bilaterally, no wheezing, rhonchi or rales  Abd: soft,  nontender, no hepatomegaly  Ext: no edema Musculoskeletal:  No deformities, BUE and BLE strength normal and equal Skin: warm and dry  Neuro:  CNs 2-12 intact, no focal abnormalities noted Psych:  Normal affect   EKG:  The EKG was personally reviewed and demonstrates:  NSR 95bpm, nonspecific LBBB, 1st degree AV block, PRI 159m, nonspecific T wave changes  Telemetry:  Telemetry was personally reviewed and demonstrates:  n/A  Relevant CV Studies:  Echo 07/2015 Study Conclusions   - Left ventricle: The cavity size was normal. There was mild    concentric hypertrophy. Systolic function was normal. The    estimated ejection fraction was in the range of 55% to 60%. Wall    motion was normal; there were no regional wall motion    abnormalities.  - Mitral valve: There was mild to moderate regurgitation.  - Left atrium: The atrium was severely dilated.  - Right atrium: The atrium was mildly dilated.  - Tricuspid valve: There was mild-moderate regurgitation.  - Pulmonary arteries: Systolic pressure was within the normal    range.   Laboratory Data:  High Sensitivity Troponin:   Recent Labs  Lab 04/16/22 1824 04/16/22 2015  TROPONINIHS 11 13     Chemistry Recent Labs  Lab 04/16/22 1824 04/17/22 0417  NA 135 131*  K 5.1 4.9  CL 99 102  CO2 24 21*  GLUCOSE 132* 98  BUN 30* 32*  CREATININE 1.66* 1.90*  CALCIUM 9.3 8.3*  GFRNONAA 39* 33*  ANIONGAP 12 8    Recent Labs  Lab 04/16/22 1824 04/17/22 0417  PROT 7.8 6.8  ALBUMIN 4.0 3.3*  AST 492* 413*  ALT 316* 412*  ALKPHOS 334* 300*  BILITOT 2.1* 2.5*   Lipids No results for input(s): "CHOL", "TRIG", "HDL", "LABVLDL", "  [place]", "CHOLHDL" in the last 168 hours.  Hematology Recent Labs  Lab 04/16/22 1824 04/17/22 0417  WBC 15.8* 20.7*  RBC 4.26 3.70*  HGB 12.6* 11.1*  HCT 39.5 33.7*  MCV 92.7 91.1  MCH 29.6 30.0  MCHC 31.9 32.9  RDW 14.2 14.2  PLT 252 180   Thyroid No results for input(s): "TSH", "FREET4"  in the last 168 hours.  BNP Recent Labs  Lab 04/16/22 1824  BNP 175.7*    DDimer No results for input(s): "DDIMER" in the last 168 hours.   Radiology/Studies:  CT Abdomen Pelvis W Contrast  Result Date: 04/16/2022 CLINICAL DATA:  Abdominal pain, nausea vomiting. EXAM: CT ABDOMEN AND PELVIS WITH CONTRAST TECHNIQUE: Multidetector CT imaging of the abdomen and pelvis was performed using the standard protocol following bolus administration of intravenous contrast. RADIATION DOSE REDUCTION: This exam was performed according to the departmental dose-optimization program which includes automated exposure control, adjustment of the mA and/or kV according to patient size and/or use of iterative reconstruction technique. CONTRAST:  28m OMNIPAQUE IOHEXOL 300 MG/ML  SOLN COMPARISON:  Right upper quadrant ultrasound dated 04/16/2022. FINDINGS: Evaluation of this exam is limited due to respiratory motion artifact. Lower chest: Minimal bibasilar subpleural atelectasis. The visualized lung bases are otherwise clear. There is coronary vascular calcification. Cardiac pacemaker wires noted. No intra-abdominal free air or free fluid. Hepatobiliary: Fatty liver. There is mild intrahepatic biliary ductal dilatation. A noncalcified rounded density within the gallbladder corresponds to the structure seen on the ultrasound. No pericholecystic fluid. Noncalcified stones may be present in the central CBD (coronal 46/5) but not evaluated on this CT. MRCP may provide better evaluation if clinically indicated. Pancreas: Moderately atrophic. No active inflammatory changes. No dilatation of the main pancreatic duct. Spleen: Normal in size without focal abnormality. Adrenals/Urinary Tract: The adrenal glands unremarkable. There is no hydronephrosis on either side. There is symmetric enhancement and excretion of contrast by both kidneys. Bilateral renal cysts measure up to 5.5 cm in the inferior pole of the left kidney. Several smaller  hypodense lesions are not characterized. The visualized ureters and urinary bladder appear unremarkable. Stomach/Bowel: Moderate stool throughout the colon. There is no bowel obstruction or active inflammation. Normal appendix. Vascular/Lymphatic: Advanced aortoiliac atherosclerotic disease. The IVC is unremarkable. No portal venous gas. There is no adenopathy. Reproductive: The prostate and seminal vesicles are grossly unremarkable. No pelvic mass. Other: None Musculoskeletal: Osteopenia with degenerative changes of the spine. No acute osseous pathology. IMPRESSION: 1. No bowel obstruction. Normal appendix. 2. Noncalcified structure within the gallbladder as seen on the ultrasound. Mild biliary ductal dilatation. The presence of noncalcified stone in the central CBD is not excluded. Evaluation is very limited due to respiratory motion. MRCP may provide better evaluation if clinically indicated. 3. Fatty liver. 4. Aortic Atherosclerosis (ICD10-I70.0). Electronically Signed   By: AAnner CreteM.D.   On: 04/16/2022 23:10   UKoreaAbdomen Limited RUQ (LIVER/GB)  Result Date: 04/16/2022 CLINICAL DATA:  Elevated LFTs EXAM: ULTRASOUND ABDOMEN LIMITED RIGHT UPPER QUADRANT COMPARISON:  None Available. FINDINGS: Gallbladder: Gallbladder is well distended with a 1.2 cm echogenic focus which is non mobile near the gallbladder neck. This may represent a sludge ball although polyp would deserve consideration as well. No cholelithiasis is noted. Common bile duct: Diameter: 4.6 mm. Liver: No focal lesion identified. Within normal limits in parenchymal echogenicity. Portal vein is patent on color Doppler imaging with normal direction of blood flow towards the liver. Other: None. IMPRESSION: Non mobile echogenic focus within the  gallbladder as described. Follow-up can be performed as clinically indicated given the patient's advanced age. Electronically Signed   By: Inez Catalina M.D.   On: 04/16/2022 20:26   DG Chest Port 1  View  Result Date: 04/16/2022 CLINICAL DATA:  Pt brought in by EMS today for shaking that started today. EMS sts that they put pt on the monitor and noticed that his HR is ranging from 30-100 bpm. Pt has a pacemaker that was replaced about four years ago. Per EMS pt is having long sinus pauses. EXAM: PORTABLE CHEST - 1 VIEW COMPARISON:  05/10/2019 FINDINGS: Relatively low lung volumes with crowding of bronchovascular structures at the lung bases. No definite confluent airspace disease. Heart size and mediastinal contours are within normal limits. Aortic Atherosclerosis (ICD10-170.0). Stable left subclavian transvenous pacemaker. No effusion. Visualized bones unremarkable. IMPRESSION: Low volumes with bibasilar opacities.  No definite acute disease. Electronically Signed   By: Lucrezia Europe M.D.   On: 04/16/2022 18:36     Assessment and Plan:   Tachybrady syndrome s/p PPM - patient had pacemaker placement for tachybrady syndrome placed In 2007, generator change out in 2020 - this is followed by Dr. Lavera Guise - there was a concern for bradycardia in the setting of acute GI issues, possible cholecystitis - family reports drops in his heart rate - patient denies anginal symptoms, lightheadedness or dizziness - [employer] device interrogation showed no abnormalities and no significant drops in heart rate. Device leads are not MRI compatible.  - will check an echo - MD to see   For questions or updates, please contact [place] Please consult [email or web address] for contact info under    Signed, Watt Geiler Ninfa Meeker, PA-C  04/17/2022 11:24 AM

## 2022-04-17 NOTE — ED Notes (Signed)
Patient transported to [facility] 

## 2022-04-18 ENCOUNTER — Inpatient Hospital Stay (HOSPITAL_COMMUNITY)
Admit: 2022-04-18 | Discharge: 2022-04-18 | Disposition: A | Discharge status: Home / Self Care | Payer: MEDICARE | Attending: Medical | Admitting: Medical

## 2022-04-18 DIAGNOSIS — R0609 Other forms of dyspnea: Secondary | ICD-10-CM

## 2022-04-18 DIAGNOSIS — R7989 Other specified abnormal findings of blood chemistry: Secondary | ICD-10-CM | POA: Diagnosis not present

## 2022-04-18 LAB — GLUCOSE, CAPILLARY
Glucose-Capillary: 88 mg/dL (ref 70–99)
Glucose-Capillary: 123 mg/dL — ABNORMAL HIGH (ref 70–99)
Glucose-Capillary: 117 mg/dL — ABNORMAL HIGH (ref 70–99)
Glucose-Capillary: 116 mg/dL — ABNORMAL HIGH (ref 70–99)

## 2022-04-18 LAB — COMPREHENSIVE METABOLIC PANEL
ALT: 231 U/L — ABNORMAL HIGH (ref 0–44)
AST: 144 U/L — ABNORMAL HIGH (ref 15–41)
Albumin: 3.1 g/dL — ABNORMAL LOW (ref 3.5–5.0)
Alkaline Phosphatase: 232 U/L — ABNORMAL HIGH (ref 38–126)
Anion gap: 9 (ref 5–15)
BUN: 30 mg/dL — ABNORMAL HIGH (ref 8–23)
CO2: 19 mmol/L — ABNORMAL LOW (ref 22–32)
Calcium: 8.4 mg/dL — ABNORMAL LOW (ref 8.9–10.3)
Chloride: 103 mmol/L (ref 98–111)
Creatinine, Ser: 1.52 mg/dL — ABNORMAL HIGH (ref 0.61–1.24)
GFR, Estimated: 43 mL/min — ABNORMAL LOW (ref >60)
Glucose, Bld: 91 mg/dL (ref 70–99)
Potassium: 4.8 mmol/L (ref 3.5–5.1)
Sodium: 131 mmol/L — ABNORMAL LOW (ref 135–145)
Total Bilirubin: 2.1 mg/dL — ABNORMAL HIGH (ref 0.3–1.2)
Total Protein: 6.4 g/dL — ABNORMAL LOW (ref 6.5–8.1)

## 2022-04-18 LAB — ECHOCARDIOGRAM COMPLETE
AR max vel: 1.56 cm2
AV Area VTI: 1.52 cm2
AV Area mean vel: 1.34 cm2
AV Mean grad: 6 mmHg
AV Peak grad: 11 mmHg
Ao pk vel: 1.66 m/s
Area-P 1/2: 3.24 cm2
Height: 68 in
S' Lateral: 3.12 cm
Weight: 3527.36 oz

## 2022-04-18 LAB — CBC
HCT: 31.8 % — ABNORMAL LOW (ref 39.0–52.0)
Hemoglobin: 10.5 g/dL — ABNORMAL LOW (ref 13.0–17.0)
MCH: 29.7 pg (ref 26.0–34.0)
MCHC: 33 g/dL (ref 30.0–36.0)
MCV: 90.1 fL (ref 80.0–100.0)
Platelets: 159 10*3/uL (ref 150–400)
RBC: 3.53 MIL/uL — ABNORMAL LOW (ref 4.22–5.81)
RDW: 14.4 % (ref 11.5–15.5)
WBC: 12.8 10*3/uL — ABNORMAL HIGH (ref 4.0–10.5)
nRBC: 0 % (ref 0.0–0.2)

## 2022-04-18 MED ORDER — PERFLUTREN LIPID MICROSPHERE
1.0000 mL | INTRAVENOUS | Status: AC | PRN
  Administered 2022-04-18: 2 mL via INTRAVENOUS

## 2022-04-18 MED ORDER — SIMETHICONE 80 MG PO CHEW
80.0000 mg | CHEWABLE_TABLET | Freq: Four times a day (QID) | ORAL | Status: DC | PRN
  Administered 2022-04-18: 80 mg via ORAL
  Filled 2022-04-18 (×2): qty 1

## 2022-04-18 NOTE — Progress Notes (Signed)
*  PRELIMINARY RESULTS* Echocardiogram 2D Echocardiogram has been performed.  Samuel Mack 04/18/2022, 10:22 AM

## 2022-04-18 NOTE — Plan of Care (Signed)

## 2022-04-18 NOTE — Progress Notes (Signed)
[facility] SURGICAL PROGRESS NOTE (cpt [phone])  Hospital Day(s): 1.   Interval History: Patient seen and examined, no acute events or new complaints overnight. Patient reports he actually had left sided abdominal pain this morning when he woke up but interesting this resolved after eating breakfast. No fever, chills, nausea, emesis. His leukocytosis has made significant improvements, now 12.8K. Renal function closer to his baseline, sCr - 1.52; UO - 400 ccs + unmeasured. He continues to have LFT elevation but these are improving. Hyperbilirubinemia to 2.1. Again, HIDA yesterday (08/02) was concerning for distal CBD obstruction. He could not undergo MRI secondary to pacemaker leads not being compatible. He is on heart healthy diet. Continues on Zosyn.   Review of Systems:  Constitutional: denies fever, chills  HEENT: denies cough or congestion  Respiratory: denies any shortness of breath  Cardiovascular: denies chest pain or palpitations  Gastrointestinal: + abdominal pain (LUQ this morning, now resolved), denied N/V Genitourinary: denies burning with urination or urinary frequency Musculoskeletal: denies pain, decreased motor or sensation  Vital signs in last 24 hours: [min-max] current  Temp:  [98 F (36.7 C)-98.7 F (37.1 C)] 98.3 F (36.8 C) (08/03 0403) Pulse Rate:  [69-71] 69 (08/03 0403) Resp:  [15-29] 18 (08/03 0403) BP: (97-129)/(43-69) 113/64 (08/03 0403) SpO2:  [93 %-100 %] 95 % (08/03 0403)     Height: '5\' 8"'$  (172.7 cm) Weight: 100 kg     Intake/Output last 2 shifts:  08/02 0701 - 08/03 0700 In: 1177.1 [I.V.:1174; IV Piggyback:3.1] Out: 400 [Urine:400]   Physical Exam:  Constitutional: alert, cooperative and no distress  HENT: normocephalic without obvious abnormality  Eyes: Mild scleral icterus, EOMI Respiratory: breathing non-labored at rest  Cardiovascular: regular rate and sinus rhythm  Gastrointestinal: soft, he is non-tender, negative Murphy's  Sign, and non-distended, no rebound/guarding Musculoskeletal: UE and LE FROM, no edema or wounds, motor and sensation grossly intact, NT    Labs:     Latest Ref Rng & Units 04/18/2022    5:40 AM 04/17/2022    4:17 AM 04/16/2022    6:24 PM  CBC  WBC 4.0 - 10.5 K/uL 12.8  20.7  15.8   Hemoglobin 13.0 - 17.0 g/dL 10.5  11.1  12.6   Hematocrit 39.0 - 52.0 % 31.8  33.7  39.5   Platelets 150 - 400 K/uL 159  180  252       Latest Ref Rng & Units 04/18/2022    5:40 AM 04/17/2022    4:17 AM 04/16/2022    6:24 PM  CMP  Glucose 70 - 99 mg/dL 91  98  132   BUN 8 - 23 mg/dL 30  32  30   Creatinine 0.61 - 1.24 mg/dL 1.52  1.90  1.66   Sodium 135 - 145 mmol/L 131  131  135   Potassium 3.5 - 5.1 mmol/L 4.8  4.9  5.1   Chloride 98 - 111 mmol/L 103  102  99   CO2 22 - 32 mmol/L '19  21  24   '$ Calcium 8.9 - 10.3 mg/dL 8.4  8.3  9.3   Total Protein 6.5 - 8.1 g/dL 6.4  6.8  7.8   Total Bilirubin 0.3 - 1.2 mg/dL 2.1  2.5  2.1   Alkaline Phos 38 - 126 U/L 232  300  334   AST 15 - 41 U/L 144  413  492   ALT 0 - 44 U/L 231  412  316  Imaging studies: No new pertinent imaging studies   Assessment/Plan: (ICD-10's: R72.89) 88 y.o. male with hyperbilirubinemia and work up concerning for distal CBD obstruction vs cholestasis, no evidence of cholecystitis.    - Appreciate GI assistance; plan for ERCP tomorrow (08/04) with Dr Allen Norris   Faythe Ghee to continue low fat diet; NPO at midnight  - Continue IV Abx (Zosyn) given leukocytosis and possible CBD obstruction  - No surgical intervention   - Monitor abdominal examination   - Pain control prn; antiemetics prn - Monitor LFT/Bilirubin; improving - Mobilization as tolerated - Further management per primary service; we will follow peripherally for now and await ERCP results.    All of the above findings and recommendations were discussed with the patient, and the medical team, and all of patient's questions were answered to his expressed satisfaction.  -- [name], PA-C [facility] 04/18/2022, 7:44 AM M-F: 7am - 4pm

## 2022-04-18 NOTE — Progress Notes (Signed)
GI Inpatient Follow-up Note  Subjective:  Patient seen in follow-up for hyperbilirubinemia c/w distal CBD obstruction. No acute events overnight. Daughter present bedside. He had some mild left-sided upper abdominal pain this morning before breakfast which resolved after eating. He has been spitting up some phlegm/mucous but no food regurgitation. He had parts of biscuit and cinnamon roll from [employer] for breakfast. No fevers, chills, or altered mental status. WBC down to 12.8K from 20.7K yesterday. LFTs are downtrending with total bilirubin 2.1, AST 144, ALT 231, and alk phos 232.   Scheduled Inpatient Medications:   cetirizine  10 mg Oral QPM   cholecalciferol  1,000 Units Oral Daily   digoxin  125 mcg Oral Daily   diltiazem  180 mg Oral Daily   enoxaparin (LOVENOX) injection  0.5 mg/kg Subcutaneous Q24H   levothyroxine  175 mcg Oral Q0600   LORazepam  0.5 mg Intravenous Once   nebivolol  10 mg Oral Daily    Continuous Inpatient Infusions:    sodium chloride 100 mL/hr at 04/18/22 0011   piperacillin-tazobactam (ZOSYN)  IV 3.375 g (04/18/22 1030)    PRN Inpatient Medications:  acetaminophen **OR** acetaminophen, magnesium hydroxide, morphine injection, ondansetron **OR** ondansetron (ZOFRAN) IV, traZODone  Review of Systems: Constitutional: Weight is stable.  Eyes: No changes in vision. ENT: No oral lesions, sore throat.  GI: see HPI.  Heme/Lymph: No easy bruising.  CV: No chest pain.  GU: No hematuria.  Integumentary: No rashes.  Neuro: No headaches.  Psych: No depression/anxiety.  Endocrine: No heat/cold intolerance.  Allergic/Immunologic: No urticaria.  Resp: No cough, SOB.  Musculoskeletal: No joint swelling.    Physical Examination: BP 128/82 (BP Location: Left Arm)   Pulse 69   Temp 98.7 F (37.1 C)   Resp 20   Ht _0  (1.727 m)   Wt 100 kg   SpO2 98%   BMI 33.52 kg/m  Gen: NAD, alert and oriented x 4, hard of hearing  HEENT: PEERLA, EOMI, Neck:  supple, no JVD or thyromegaly Chest: CTA bilaterally, no wheezes, crackles, or other adventitious sounds CV: RRR, no m/g/c/r Abd: soft, NT, ND, +BS in all four quadrants; no HSM, guarding, ridigity, or rebound tenderness Ext: no edema, well perfused with 2+ pulses, Skin: no rash or lesions noted Lymph: no LAD  Data: Lab Results  Component Value Date   WBC 12.8 (H) 04/18/2022   HGB 10.5 (L) 04/18/2022   HCT 31.8 (L) 04/18/2022   MCV 90.1 04/18/2022   PLT 159 04/18/2022   Recent Labs  Lab 04/16/22 1824 04/17/22 0417 04/18/22 0540  HGB 12.6* 11.1* 10.5*   Lab Results  Component Value Date   NA 131 (L) 04/18/2022   K 4.8 04/18/2022   CL 103 04/18/2022   CO2 19 (L) 04/18/2022   BUN 30 (H) 04/18/2022   CREATININE 1.52 (H) 04/18/2022   Lab Results  Component Value Date   ALT 231 (H) 04/18/2022   AST 144 (H) 04/18/2022   ALKPHOS 232 (H) 04/18/2022   BILITOT 2.1 (H) 04/18/2022   Recent Labs  Lab 04/16/22 1824  INR 1.2   HIDA 04/17/2022: IMPRESSION: Findings most consistent with distal common bile duct obstruction. Potential obstructing lesion identified on comparison CT. Consider MRCP for further evaluation versus ERCP for clearance of obstruction.   Filling of the gallbladder cannot be adequately assessed on the background of common bile duct obstruction.   CT abd/pelvis 04/16/2022: IMPRESSION: 1. No bowel obstruction. Normal appendix. 2. Noncalcified structure within the  gallbladder as seen on the ultrasound. Mild biliary ductal dilatation. The presence of noncalcified stone in the central CBD is not excluded. Evaluation is very limited due to respiratory motion. MRCP may provide better evaluation if clinically indicated. 3. Fatty liver. 4. Aortic Atherosclerosis (ICD10-I70.0).   RUQ [place] 04/16/2022: IMPRESSION: Non mobile echogenic focus within the gallbladder as described. Follow-up can be performed as clinically indicated given the patient's advanced age.    Assessment/Plan:  88 y/o Caucasian male with a PMH of HTN, HLD, CKD Stage IIIb, CAD, SSS s/p PPM, T2DM, asthma, hypothyroidism, and obesity presented to the [facility] ED yesterday afternoon for chief complaint of tremors, nausea and vomiting found to have elevated LFTs mixed pattern with imaging concerns for distal CBD obstruction   Hyperbilirubinemia c/w biliary obstruction - laboratory and imaging studies concerning for distal CBD obstruction. DDx includes choledocholithiasis, cholangiocarcinoma, polyp, stricture, etc.    SSS s/p PPM - unable to perform MRCP here due to wandering leads    CKD Stage IIIb   Advanced age     Recommendations:  - ERCP planned for tomorrow with Dr. Allen Norris - Continue IV Zosyn - Continue to monitor WBC and LFTs - Continue serial abdominal examinations - Continue low-fat/heart healthy diet. NPO after midnight.  - Further recommendations after ERCP tomorrow   Please call with questions or concerns.    Octavia Bruckner, PA-C [facility] [phone] (Cell)

## 2022-04-18 NOTE — Progress Notes (Signed)
[facility] Progress Note  Patient: Samuel Mack     NKN:[id number]  DOA: 04/16/2022   PCP: Casilda Carls, MD       Brief hospital course:  This is a 88 year old male with CKD 3B, hypothyroidism, coronary artery disease, sick sinus syndrome with pacemaker, diabetes mellitus type 2, asthma and hypertension.  The patient presented to the hospital for nausea vomiting and "shaking.  EMS noted that he was bradycardic with shaking and diaphoresis. According to the HPI, the patient had a problem with his pacemaker which was adjusted and subsequently did not have any issues with bradycardia In the ED, lab work revealed elevated LFTs, WBC count of 15.8. And pelvis with contrast:Noncalcified structure within the gallbladder as seen on the ultrasound. Mild biliary ductal dilatation and fatty liver  Subjective:  The patient has not had any nausea vomiting or abdominal pain. Assessment and Plan:  Elevated LFTs-distal CBD obstruction -he tells me today that he has had vomiting after breakfast every day for about 3 weeks now. - HIDA scan is positive for an obstruction in the distal CBD - Plan for ERCP on Friday -WBC increased from 15.8-20.7 and subsequently improved to 12.8 - LFTs improving-Continue Zosyn  Active Problems:   Sinus bradycardia -Appreciate cardiology evaluation - Pacemaker has been interrogated and no abnormalities noted per cardiology  None anion gap metabolic acidosis - Consult Acute infection - Follow     Hypothyroidism -Continue Synthroid  Dehydration Essential hypertension -Continue to hold Aldactone  - Continue Cardizem and Bystolic     Dyslipidemia -Hold Lipitor for now     Stage IIIb chronic kidney disease ([place]) -Creatinine 1.52 today, improved from 1.9 yesterday - Aldactone held     DVT prophylaxis:    SCDs    Code Status: Full Code  Consultants: GI, general surgery Level of Care: Level of care: Telemetry Medical Disposition Plan:  Status is:  Inpatient Remains inpatient appropriate because: For CBD obstruction  Objective:   Vitals:   04/18/22 0007 04/18/22 0403 04/18/22 0825 04/18/22 1225  BP: 103/66 113/64 128/82 132/69  Pulse: 71 69 69 70  Resp: '18 18 20 18  '$ Temp: 98.7 F (37.1 C) 98.3 F (36.8 C) 98.7 F (37.1 C) 98.8 F (37.1 C)  TempSrc:      SpO2: 93% 95% 98% 96%  Weight:      Height:       Filed Weights   04/16/22 1819  Weight: 100 kg   Exam: General exam: Appears comfortable  HEENT: PERRLA, oral mucosa moist, no sclera icterus or thrush Respiratory system: Clear to auscultation. Respiratory effort normal. Cardiovascular system: S1 & S2 heard, regular rate and rhythm Gastrointestinal system: Abdomen soft, non-tender, nondistended. Normal bowel sounds   Central nervous system: Alert and oriented. No focal neurological deficits. Extremities: No cyanosis, clubbing or edema Skin: No rashes or ulcers Psychiatry:  Mood & affect appropriate.    Imaging and lab data was personally reviewed    CBC: Recent Labs  Lab 04/16/22 1824 04/17/22 0417 04/18/22 0540  WBC 15.8* 20.7* 12.8*  NEUTROABS 12.6*  --   --   HGB 12.6* 11.1* 10.5*  HCT 39.5 33.7* 31.8*  MCV 92.7 91.1 90.1  PLT 252 180 379   Basic Metabolic Panel: Recent Labs  Lab 04/16/22 1824 04/17/22 0417 04/18/22 0540  NA 135 131* 131*  K 5.1 4.9 4.8  CL 99 102 103  CO2 24 21* 19*  GLUCOSE 132* 98 91  BUN 30* 32* 30*  CREATININE  1.66* 1.90* 1.52*  CALCIUM 9.3 8.3* 8.4*   GFR: Estimated Creatinine Clearance: 37 mL/min (A) (by C-G formula based on SCr of 1.52 mg/dL (H)).  Scheduled Meds:  cetirizine  10 mg Oral QPM   cholecalciferol  1,000 Units Oral Daily   digoxin  125 mcg Oral Daily   diltiazem  180 mg Oral Daily   enoxaparin (LOVENOX) injection  0.5 mg/kg Subcutaneous Q24H   levothyroxine  175 mcg Oral Q0600   LORazepam  0.5 mg Intravenous Once   nebivolol  10 mg Oral Daily   Continuous Infusions:  sodium chloride 100 mL/hr  at 04/18/22 1120   piperacillin-tazobactam (ZOSYN)  IV 12.5 mL/hr at 04/18/22 1120     LOS: 1 day   Author: Debbe Odea  04/18/2022 3:47 PM

## 2022-04-19 ENCOUNTER — Inpatient Hospital Stay: Payer: MEDICARE

## 2022-04-19 ENCOUNTER — Inpatient Hospital Stay: Payer: MEDICARE | Admitting: Anesthesiology

## 2022-04-19 ENCOUNTER — Encounter
Admission: EM | Disposition: A | Discharge status: Home / Self Care | Payer: Self-pay | Source: Home / Self Care | Attending: Internal Medicine

## 2022-04-19 DIAGNOSIS — K8050 Calculus of bile duct without cholangitis or cholecystitis without obstruction: Secondary | ICD-10-CM

## 2022-04-19 DIAGNOSIS — R7989 Other specified abnormal findings of blood chemistry: Secondary | ICD-10-CM | POA: Diagnosis not present

## 2022-04-19 HISTORY — PX: ERCP: SHX5425

## 2022-04-19 LAB — CBC
HCT: 31.7 % — ABNORMAL LOW (ref 39.0–52.0)
Hemoglobin: 10.5 g/dL — ABNORMAL LOW (ref 13.0–17.0)
MCH: 29.4 pg (ref 26.0–34.0)
MCHC: 33.1 g/dL (ref 30.0–36.0)
MCV: 88.8 fL (ref 80.0–100.0)
Platelets: 167 10*3/uL (ref 150–400)
RBC: 3.57 MIL/uL — ABNORMAL LOW (ref 4.22–5.81)
RDW: 14.2 % (ref 11.5–15.5)
WBC: 12.6 10*3/uL — ABNORMAL HIGH (ref 4.0–10.5)
nRBC: 0 % (ref 0.0–0.2)

## 2022-04-19 LAB — GLUCOSE, CAPILLARY
Glucose-Capillary: 81 mg/dL (ref 70–99)
Glucose-Capillary: 91 mg/dL (ref 70–99)

## 2022-04-19 LAB — COMPREHENSIVE METABOLIC PANEL
ALT: 171 U/L — ABNORMAL HIGH (ref 0–44)
AST: 75 U/L — ABNORMAL HIGH (ref 15–41)
Albumin: 3.3 g/dL — ABNORMAL LOW (ref 3.5–5.0)
Alkaline Phosphatase: 223 U/L — ABNORMAL HIGH (ref 38–126)
Anion gap: 8 (ref 5–15)
BUN: 26 mg/dL — ABNORMAL HIGH (ref 8–23)
CO2: 19 mmol/L — ABNORMAL LOW (ref 22–32)
Calcium: 8.4 mg/dL — ABNORMAL LOW (ref 8.9–10.3)
Chloride: 103 mmol/L (ref 98–111)
Creatinine, Ser: 1.38 mg/dL — ABNORMAL HIGH (ref 0.61–1.24)
GFR, Estimated: 49 mL/min — ABNORMAL LOW (ref >60)
Glucose, Bld: 95 mg/dL (ref 70–99)
Potassium: 4.2 mmol/L (ref 3.5–5.1)
Sodium: 130 mmol/L — ABNORMAL LOW (ref 135–145)
Total Bilirubin: 2.2 mg/dL — ABNORMAL HIGH (ref 0.3–1.2)
Total Protein: 6.5 g/dL (ref 6.5–8.1)

## 2022-04-19 SURGERY — ERCP, WITH INTERVENTION IF INDICATED
Anesthesia: General

## 2022-04-19 MED ORDER — ONDANSETRON HCL 4 MG/2ML IJ SOLN
INTRAMUSCULAR | Status: DC | PRN
  Administered 2022-04-19: 4 mg via INTRAVENOUS

## 2022-04-19 MED ORDER — DICLOFENAC SUPPOSITORY 100 MG
RECTAL | Status: AC
  Filled 2022-04-19: qty 1

## 2022-04-19 MED ORDER — ONDANSETRON HCL 4 MG/2ML IJ SOLN
INTRAMUSCULAR | Status: AC
  Filled 2022-04-19: qty 2

## 2022-04-19 MED ORDER — SUCCINYLCHOLINE CHLORIDE 200 MG/10ML IV SOSY
PREFILLED_SYRINGE | INTRAVENOUS | Status: AC
  Filled 2022-04-19: qty 10

## 2022-04-19 MED ORDER — FENTANYL CITRATE (PF) 100 MCG/2ML IJ SOLN
INTRAMUSCULAR | Status: DC | PRN
  Administered 2022-04-19: 50 ug via INTRAVENOUS

## 2022-04-19 MED ORDER — DICLOFENAC SUPPOSITORY 100 MG
100.0000 mg | Freq: Once | RECTAL | Status: AC
  Administered 2022-04-19: 100 mg via RECTAL

## 2022-04-19 MED ORDER — LACTATED RINGERS IV SOLN
INTRAVENOUS | Status: DC
  Administered 2022-04-19: 1000 mL via INTRAVENOUS

## 2022-04-19 MED ORDER — SUCCINYLCHOLINE CHLORIDE 200 MG/10ML IV SOSY
PREFILLED_SYRINGE | INTRAVENOUS | Status: DC | PRN
  Administered 2022-04-19: 120 mg via INTRAVENOUS

## 2022-04-19 MED ORDER — SODIUM CHLORIDE 0.9 % IV SOLN: INTRAVENOUS | Status: DC

## 2022-04-19 MED ORDER — PHENYLEPHRINE 80 MCG/ML (10ML) SYRINGE FOR IV PUSH (FOR BLOOD PRESSURE SUPPORT)
PREFILLED_SYRINGE | INTRAVENOUS | Status: AC
  Filled 2022-04-19: qty 10

## 2022-04-19 MED ORDER — LIDOCAINE HCL (PF) 2 % IJ SOLN
INTRAMUSCULAR | Status: AC
  Filled 2022-04-19: qty 5

## 2022-04-19 MED ORDER — PHENYLEPHRINE 80 MCG/ML (10ML) SYRINGE FOR IV PUSH (FOR BLOOD PRESSURE SUPPORT)
PREFILLED_SYRINGE | INTRAVENOUS | Status: DC | PRN
  Administered 2022-04-19: 160 ug via INTRAVENOUS

## 2022-04-19 MED ORDER — PROPOFOL 10 MG/ML IV BOLUS
INTRAVENOUS | Status: DC | PRN
  Administered 2022-04-19: 120 mg via INTRAVENOUS

## 2022-04-19 MED ORDER — LIDOCAINE HCL (CARDIAC) PF 100 MG/5ML IV SOSY
PREFILLED_SYRINGE | INTRAVENOUS | Status: DC | PRN
  Administered 2022-04-19: 80 mg via INTRAVENOUS

## 2022-04-19 MED ORDER — FENTANYL CITRATE (PF) 100 MCG/2ML IJ SOLN
INTRAMUSCULAR | Status: AC
  Filled 2022-04-19: qty 2

## 2022-04-19 MED ORDER — BIOTENE DRY MOUTH MT LIQD: 15.0000 mL | OROMUCOSAL | Status: DC | PRN

## 2022-04-19 NOTE — Progress Notes (Signed)
[facility] Progress Note  Patient: Samuel Mack     NOM:[id number]  DOA: 04/16/2022   PCP: Casilda Carls, MD       Brief hospital course:  This is a 88 year old male with CKD 3B, hypothyroidism, coronary artery disease, sick sinus syndrome with pacemaker, diabetes mellitus type 2, asthma and hypertension.  The patient presented to the hospital for nausea vomiting and "shaking.  EMS noted that he was bradycardic with shaking and diaphoresis. According to the HPI, the patient had a problem with his pacemaker which was adjusted and subsequently did not have any issues with bradycardia In the ED, lab work revealed elevated LFTs, WBC count of 15.8. And pelvis with contrast:Noncalcified structure within the gallbladder as seen on the ultrasound. Mild biliary ductal dilatation and fatty liver  Subjective:  Patient evaluated this morning prior to the ERCP.  He was asleep.  He is easily awakened and had no complaints for me.  I discussed plan for ERCP and details of the procedure with both sisters at bedside. Assessment and Plan:  Elevated LFTs-distal CBD obstruction -he tells me today that he has had vomiting after breakfast every day for about 3 weeks now. - HIDA scan is positive for an obstruction in the distal CBD -WBC increased from 15.8-20.7 and subsequently improved to 12.8 - LFTs improving-Continue Zosyn -ERCP reveals choledocholithiasis-sphincterotomy done and stone removal-clear liquid diet to be started after the procedure  Active Problems:   Sinus bradycardia -Appreciate cardiology evaluation - Pacemaker has been interrogated and no abnormalities noted per cardiology  Non anion gap metabolic acidosis -Likely due to acute infection     Hypothyroidism -Continue Synthroid   Essential hypertension -Continue to hold Aldactone  - Continue Cardizem and Bystolic     Dyslipidemia -Hold Lipitor for now     Stage IIIb chronic kidney disease ([place]) -Creatinine 1.9> 1.5 >  1.38 - Aldactone held     DVT prophylaxis:  Place and maintain sequential compression device Start: 04/18/22 1555  SCDs    Code Status: Full Code  Consultants: GI, general surgery Level of Care: Level of care: Telemetry Medical Disposition Plan:  Status is: Inpatient Remains inpatient appropriate because: For CBD obstruction  Objective:   Vitals:   04/19/22 1250 04/19/22 1253 04/19/22 1254 04/19/22 1300  BP:  116/89 (!) 116/98 122/65  Pulse: 71 (!) 40 71 71  Resp: '18 17 15 19  '$ Temp:      TempSrc:      SpO2: 99% 94% 95% 96%  Weight:      Height:       Filed Weights   04/16/22 1819  Weight: 100 kg   Exam: General exam: Appears comfortable  HEENT: PERRLA, oral mucosa moist, no sclera icterus or thrush Respiratory system: Clear to auscultation. Respiratory effort normal. Cardiovascular system: S1 & S2 heard, regular rate and rhythm Gastrointestinal system: Abdomen soft, non-tender, nondistended. Normal bowel sounds   Central nervous system: Alert and oriented. No focal neurological deficits. Extremities: No cyanosis, clubbing or edema Skin: No rashes or ulcers Psychiatry:  Mood & affect appropriate.    Imaging and lab data was personally reviewed    CBC: Recent Labs  Lab 04/16/22 1824 04/17/22 0417 04/18/22 0540 04/19/22 0531  WBC 15.8* 20.7* 12.8* 12.6*  NEUTROABS 12.6*  --   --   --   HGB 12.6* 11.1* 10.5* 10.5*  HCT 39.5 33.7* 31.8* 31.7*  MCV 92.7 91.1 90.1 88.8  PLT 252 180 159 962    Basic Metabolic  Panel: Recent Labs  Lab 04/16/22 1824 04/17/22 0417 04/18/22 0540 04/19/22 0531  NA 135 131* 131* 130*  K 5.1 4.9 4.8 4.2  CL 99 102 103 103  CO2 24 21* 19* 19*  GLUCOSE 132* 98 91 95  BUN 30* 32* 30* 26*  CREATININE 1.66* 1.90* 1.52* 1.38*  CALCIUM 9.3 8.3* 8.4* 8.4*    GFR: Estimated Creatinine Clearance: 40.8 mL/min (A) (by C-G formula based on SCr of 1.38 mg/dL (H)).  Scheduled Meds:  cetirizine  10 mg Oral QPM   cholecalciferol  1,000  Units Oral Daily   diclofenac       digoxin  125 mcg Oral Daily   diltiazem  180 mg Oral Daily   enoxaparin (LOVENOX) injection  0.5 mg/kg Subcutaneous Q24H   levothyroxine  175 mcg Oral Q0600   LORazepam  0.5 mg Intravenous Once   nebivolol  10 mg Oral Daily   Continuous Infusions:  sodium chloride 50 mL/hr at 04/18/22 1625   piperacillin-tazobactam (ZOSYN)  IV 3.375 g (04/19/22 0911)     LOS: 2 days   Author: Debbe Odea  04/19/2022 4:01 PM

## 2022-04-19 NOTE — Op Note (Signed)
[facility] Gastroenterology Patient Name: Samuel Mack Procedure Date: 04/19/2022 11:16 AM MRN: [id number] Account #: [id number] Date of Birth: 06/11/1930 Admit Type: Outpatient Age: 88 Room: [facility] ENDO ROOM 4 Gender: Male Note Status: Finalized Instrument Name: Selinda Eon [id number] Procedure:             ERCP Indications:           Bile duct stone(s) Providers:             Lucilla Lame MD, MD Medicines:             General Anesthesia Complications:         No immediate complications. Procedure:             Pre-Anesthesia Assessment:                        - Prior to the procedure, a History and Physical was                         performed, and patient medications and allergies were                         reviewed. The patient's tolerance of previous                         anesthesia was also reviewed. The risks and benefits                         of the procedure and the sedation options and risks                         were discussed with the patient. All questions were                         answered, and informed consent was obtained. Prior                         Anticoagulants: The patient has taken no previous                         anticoagulant or antiplatelet agents. ASA Grade                         Assessment: II - A patient with mild systemic disease.                         After reviewing the risks and benefits, the patient                         was deemed in satisfactory condition to undergo the                         procedure.                        After obtaining informed consent, the scope was passed                         under direct vision. Throughout the procedure, the  patient's blood pressure, pulse, and oxygen                         saturations were monitored continuously. The                         Duodenoscope was introduced through the mouth, and                         used to inject contrast into and  used to inject                         contrast into the bile duct. The ERCP was accomplished                         without difficulty. The patient tolerated the                         procedure well. Findings:      The scout film was normal. The major papilla was located entirely within       a diverticulum. The bile duct was deeply cannulated with the short-nosed       traction sphincterotome. Contrast was injected. I personally interpreted       the bile duct images. There was brisk flow of contrast through the       ducts. Image quality was excellent. Contrast extended to the entire       biliary tree. The lower third of the main bile duct contained filling       defect(s). A wire was passed into the biliary tree. An 8 mm biliary       sphincterotomy was made with a traction (standard) sphincterotome using       ERBE electrocautery. There was no post-sphincterotomy bleeding. The       biliary tree was swept with a 15 mm balloon starting at the bifurcation.       Two stones were removed. No stones remained. Impression:            - The major papilla was located entirely within a                         diverticulum.                        - A filling defect was seen on the cholangiogram.                        - Choledocholithiasis was found. Complete removal was                         accomplished by biliary sphincterotomy and balloon                         extraction.                        - A biliary sphincterotomy was performed.                        - The biliary tree was swept. Recommendation:        -  Return patient to hospital ward for ongoing care.                        - Clear liquid diet today.                        - Watch for pancreatitis, bleeding, perforation, and                         cholangitis. Procedure Code(s):     --- Professional ---                        [phone], Endoscopic retrograde cholangiopancreatography                         (ERCP); with  removal of calculi/debris from                         biliary/pancreatic duct(s)                        43262, Endoscopic retrograde cholangiopancreatography                         (ERCP); with sphincterotomy/papillotomy                        [phone], Endoscopic catheterization of the biliary                         ductal system, radiological supervision and                         interpretation Diagnosis Code(s):     --- Professional ---                        K80.50, Calculus of bile duct without cholangitis or                         cholecystitis without obstruction                        R93.2, Abnormal findings on diagnostic imaging of                         liver and biliary tract CPT copyright 2019 American Medical Association. All rights reserved. The codes documented in this report are preliminary and upon coder review may  be revised to meet current compliance requirements. Lucilla Lame MD, MD 04/19/2022 12:25:25 PM This report has been signed electronically. Number of Addenda: 0 Note Initiated On: 04/19/2022 11:16 AM Estimated Blood Loss:  Estimated blood loss: none.      [facility]

## 2022-04-19 NOTE — Anesthesia Preprocedure Evaluation (Signed)
Anesthesia Evaluation  Patient identified by MRN, date of birth, ID band Patient awake and Patient confused    Reviewed: Allergy & Precautions, NPO status , Patient's Chart, lab work & pertinent test results  Airway Mallampati: II  TM Distance: >3 FB Neck ROM: full    Dental  (+) Upper Dentures, Lower Dentures   Pulmonary neg pulmonary ROS, shortness of breath and with exertion, former smoker,    Pulmonary exam normal  + decreased breath sounds      Cardiovascular Exercise Tolerance: Poor hypertension, Pt. on medications + Past MI and +CHF  negative cardio ROS Normal cardiovascular exam+ dysrhythmias Atrial Fibrillation + pacemaker  Rhythm:Regular Rate:Normal     Neuro/Psych negative neurological ROS  negative psych ROS   GI/Hepatic negative GI ROS, Neg liver ROS,   Endo/Other  negative endocrine ROSdiabetes, Type 2Morbid obesity  Renal/GU      Musculoskeletal   Abdominal (+) + obese,   Peds  Hematology negative hematology ROS (+)   Anesthesia Other Findings Past Medical History: No date: Asthma No date: Cancer ([place])     Comment:  skin No date: Complication of anesthesia     Comment:  previous pacemaker patient could feel everything No date: Diabetes mellitus without complication (HCC)     Comment:  diet controlled No date: Dyspnea No date: Dysrhythmia No date: Elevated lipids No date: Hypertension No date: Hypothyroidism No date: Myocardial infarction ([place]) No date: Presence of permanent cardiac pacemaker  Past Surgical History: 12/11/2016: CATARACT EXTRACTION W/PHACO; Left     Comment:  Procedure: CATARACT EXTRACTION PHACO AND INTRAOCULAR               LENS PLACEMENT (IOC);  Surgeon: Leandrew Koyanagi, MD;               Location: [facility];  Service: Ophthalmology;                Laterality: Left;  IVA TOPICAL LEFT No date: EYE SURGERY; Left     Comment:  cataract No date: NASAL SINUS  SURGERY No date: PACEMAKER GENERATOR CHANGE; Left     Comment:  x2 2002: PACEMAKER INSERTION 05/12/2019: PACEMAKER INSERTION; Left     Comment:  Procedure: INSERTION PACEMAKER, LEFT;  Surgeon: Cletis Athens, MD;  Location: [facility] ORS;  Service: Cardiovascular;              Laterality: Left; No date: PROSTATE SURGERY 06/20/2020: TRANSURETHRAL RESECTION OF PROSTATE; N/A     Comment:  Procedure: TRANSURETHRAL RESECTION OF THE PROSTATE               (TURP);  Surgeon: [name], MD;  Location: [facility]               ORS;  Service: Urology;  Laterality: N/A;  BMI    Body Mass Index: 33.52 kg/m      Reproductive/Obstetrics negative OB ROS                             Anesthesia Physical Anesthesia Plan  ASA: 4  Anesthesia Plan: General   Post-op Pain Management:    Induction: Intravenous  PONV Risk Score and Plan: Ondansetron, Dexamethasone, Midazolam and Treatment may vary due to age or medical condition  Airway Management Planned: Oral ETT  Additional Equipment:   Intra-op Plan:   Post-operative Plan: Extubation in OR  Informed Consent: I have reviewed the patients History and Physical, chart, labs and discussed the procedure including the risks, benefits and alternatives for the proposed anesthesia with the patient or authorized representative who has indicated his/her understanding and acceptance.     Dental Advisory Given  Plan Discussed with: CRNA and Surgeon  Anesthesia Plan Comments:         Anesthesia Quick Evaluation

## 2022-04-19 NOTE — Transfer of Care (Signed)
Immediate Anesthesia Transfer of Care Note  Patient: Samuel Mack  Procedure(s) Performed: ENDOSCOPIC RETROGRADE CHOLANGIOPANCREATOGRAPHY (ERCP)  Patient Location: Endoscopy Unit  Anesthesia Type:General  Level of Consciousness: drowsy  Airway & Oxygen Therapy: Patient Spontanous Breathing and Patient connected to face mask oxygen  Post-op Assessment: Report given to RN, Post -op Vital signs reviewed and stable and Patient moving all extremities  Post vital signs: Reviewed and stable  Last Vitals:  Vitals Value Taken Time  BP 114/58 04/19/22 1234  Temp 35.4 C 04/19/22 1234  Pulse 71 04/19/22 1235  Resp 19 04/19/22 1235  SpO2 99 % 04/19/22 1235  Vitals shown include unvalidated device data.  Last Pain:  Vitals:   04/19/22 1234  TempSrc: Temporal  PainSc:       Patients Stated Pain Goal: 0 (31/12/16 2446)  Complications: No notable events documented.

## 2022-04-19 NOTE — Anesthesia Procedure Notes (Signed)
Procedure Name: Intubation Date/Time: 04/19/2022 11:54 AM  Performed by: Esaw Grandchild, CRNAPre-anesthesia Checklist: Patient identified, Emergency Drugs available, Suction available and Patient being monitored Patient Re-evaluated:Patient Re-evaluated prior to induction Oxygen Delivery Method: Circle system utilized Preoxygenation: Pre-oxygenation with 100% oxygen Induction Type: IV induction Ventilation: Two handed mask ventilation required and Mask ventilation without difficulty Laryngoscope Size: McGraph and 4 Grade View: Grade I Tube type: Oral Tube size: 7.5 mm Number of attempts: 1 Airway Equipment and Method: Stylet, Oral airway and Bite block Placement Confirmation: ETT inserted through vocal cords under direct vision, positive ETCO2 and breath sounds checked- equal and bilateral Secured at: 21 cm Tube secured with: Tape Dental Injury: Teeth and Oropharynx as per pre-operative assessment

## 2022-04-19 NOTE — Anesthesia Postprocedure Evaluation (Signed)
Anesthesia Post Note  Patient: Samuel Mack  Procedure(s) Performed: ENDOSCOPIC RETROGRADE CHOLANGIOPANCREATOGRAPHY (ERCP)  Patient location during evaluation: PACU Anesthesia Type: General Level of consciousness: sedated Pain management: pain level controlled Vital Signs Assessment: post-procedure vital signs reviewed and stable Respiratory status: spontaneous breathing and patient connected to nasal cannula oxygen Cardiovascular status: stable Anesthetic complications: no   No notable events documented.   Last Vitals:  Vitals:   04/19/22 1254 04/19/22 1300  BP: (!) 116/98 122/65  Pulse: 71 71  Resp: 15 19  Temp:    SpO2: 95% 96%    Last Pain:  Vitals:   04/19/22 1234  TempSrc: Temporal  PainSc:                  VAN STAVEREN,Sofie Schendel

## 2022-04-20 DIAGNOSIS — N179 Acute kidney failure, unspecified: Secondary | ICD-10-CM

## 2022-04-20 DIAGNOSIS — R7401 Elevation of levels of liver transaminase levels: Secondary | ICD-10-CM

## 2022-04-20 DIAGNOSIS — K8050 Calculus of bile duct without cholangitis or cholecystitis without obstruction: Secondary | ICD-10-CM

## 2022-04-20 DIAGNOSIS — R112 Nausea with vomiting, unspecified: Secondary | ICD-10-CM

## 2022-04-20 HISTORY — DX: Acute kidney failure, unspecified: N17.9

## 2022-04-20 LAB — CBC
HCT: 32.1 % — ABNORMAL LOW (ref 39.0–52.0)
Hemoglobin: 10.5 g/dL — ABNORMAL LOW (ref 13.0–17.0)
MCH: 29.2 pg (ref 26.0–34.0)
MCHC: 32.7 g/dL (ref 30.0–36.0)
MCV: 89.4 fL (ref 80.0–100.0)
Platelets: 175 10*3/uL (ref 150–400)
RBC: 3.59 MIL/uL — ABNORMAL LOW (ref 4.22–5.81)
RDW: 14.4 % (ref 11.5–15.5)
WBC: 11.1 10*3/uL — ABNORMAL HIGH (ref 4.0–10.5)
nRBC: 0 % (ref 0.0–0.2)

## 2022-04-20 LAB — COMPREHENSIVE METABOLIC PANEL
ALT: 127 U/L — ABNORMAL HIGH (ref 0–44)
AST: 47 U/L — ABNORMAL HIGH (ref 15–41)
Albumin: 3.2 g/dL — ABNORMAL LOW (ref 3.5–5.0)
Alkaline Phosphatase: 205 U/L — ABNORMAL HIGH (ref 38–126)
Anion gap: 9 (ref 5–15)
BUN: 28 mg/dL — ABNORMAL HIGH (ref 8–23)
CO2: 19 mmol/L — ABNORMAL LOW (ref 22–32)
Calcium: 8.7 mg/dL — ABNORMAL LOW (ref 8.9–10.3)
Chloride: 103 mmol/L (ref 98–111)
Creatinine, Ser: 1.52 mg/dL — ABNORMAL HIGH (ref 0.61–1.24)
GFR, Estimated: 43 mL/min — ABNORMAL LOW (ref >60)
Glucose, Bld: 85 mg/dL (ref 70–99)
Potassium: 4.4 mmol/L (ref 3.5–5.1)
Sodium: 131 mmol/L — ABNORMAL LOW (ref 135–145)
Total Bilirubin: 1.7 mg/dL — ABNORMAL HIGH (ref 0.3–1.2)
Total Protein: 6.4 g/dL — ABNORMAL LOW (ref 6.5–8.1)

## 2022-04-20 LAB — LIPASE, BLOOD: Lipase: 28 U/L (ref 11–51)

## 2022-04-20 NOTE — Progress Notes (Signed)
Pt's son-in-law present for his ride home; pt discharged via wheelchair by nursing to the [facility] entrance

## 2022-04-20 NOTE — TOC Initial Note (Signed)
Transition of Care [facility]) - Initial/Assessment Note    Patient Details  Name: Samuel Mack MRN: [id number] Date of Birth: September 04, 1930  [facility]) CM/SW Contact:    Pete Pelt, RN Phone Number: 04/20/2022, 12:25 PM  Clinical Narrative:   RNCM spoke to daughter Roselyn Reef.  Patient lives at home and his daughter Sherlyn Hay is staying with him to assist.  Patient is transported to appointments and pharmacy by daughter Roselyn Reef, and has no current issues with medication compliance.    Daughter Roselyn Reef states patient does not currently have services at home, he does have a walker, which he sometimes forgets to use.  Daughter does not feel patient has TOC needs at this time.               Expected Discharge Plan: Home/Self Care Barriers to Discharge: Continued Medical Work up   Patient Goals and CMS Choice        Expected Discharge Plan and Services Expected Discharge Plan: Home/Self Care       Living arrangements for the past 2 months: Single Family Home                                      Prior Living Arrangements/Services Living arrangements for the past 2 months: Single Family Home Lives with:: Self, Adult Children Patient language and need for interpreter reviewed:: Yes Do you feel safe going back to the place where you live?: Yes      Need for Family Participation in Patient Care: Yes (Comment) Care giver support system in place?: Yes (comment)   Criminal Activity/Legal Involvement Pertinent to Current Situation/Hospitalization: No - Comment as needed  Activities of Daily Living Home Assistive Devices/Equipment: None ADL Screening (condition at time of admission) Patient's cognitive ability adequate to safely complete daily activities?: Yes Is the patient deaf or have difficulty hearing?: Yes Does the patient have difficulty seeing, even when wearing glasses/contacts?: No Does the patient have difficulty concentrating, remembering, or making decisions?:  No Patient able to express need for assistance with ADLs?: Yes Does the patient have difficulty dressing or bathing?: No Independently performs ADLs?: Yes (appropriate for developmental age) Does the patient have difficulty walking or climbing stairs?: Yes Weakness of Legs: Both Weakness of Arms/Hands: None  Permission Sought/Granted Permission sought to share information with : Case Manager Permission granted to share information with : Yes, Verbal Permission Granted              Emotional Assessment Appearance:: Appears stated age Attitude/Demeanor/Rapport:  (Spoke to daughter who cares for patient)   Orientation: : Oriented to Self Alcohol / Substance Use: Not Applicable Psych Involvement: No (comment)  Admission diagnosis:  Transaminitis [R74.01] Elevated LFTs [R79.89] Mass of gallbladder [K82.8] Nausea and vomiting, unspecified vomiting type [R11.2] Patient Active Problem List   Diagnosis Date Noted   Calculus of common duct    Elevated LFTs 04/17/2022   Dyslipidemia 04/17/2022   Hypothyroidism 04/17/2022   Sinus bradycardia 04/17/2022   Stage III chronic kidney disease ([place]) 04/17/2022   Obesity (BMI 30-39.9) 07/23/2021   Pre-op evaluation 06/13/2020   Seasonal allergic rhinitis due to pollen 04/29/2020   Essential hypertension 04/29/2020   Pacemaker 01/20/2020   SSS (sick sinus syndrome) ([place]) 01/20/2020   PCP:  Casilda Carls, MD Pharmacy:   [id number] [id number]:  [phone] Fax: [phone]     Social Determinants of Health (SDOH) Interventions    Readmission Risk Interventions     No data to display

## 2022-04-20 NOTE — Progress Notes (Signed)
CC: Choledocholithiasis Subjective: Feeling better some abdominal pain.  Taking p.o.  No fevers or chills.  ERCP went well.  I personally reviewed the images. Today is the first time he has been lucid in the last 2 to 3 days.  Objective: Vital signs in last 24 hours: Temp:  [95.8 F (35.4 C)-97.9 F (36.6 C)] 97.9 F (36.6 C) (08/05 1131) Pulse Rate:  [40-72] 71 (08/05 1131) Resp:  [15-21] 17 (08/05 1131) BP: (113-140)/(58-98) 125/82 (08/05 1131) SpO2:  [94 %-100 %] 96 % (08/05 1131) Last BM Date : 04/18/22  Intake/Output from previous day: 08/04 0701 - 08/05 0700 In: 908.9 [P.O.:240; I.V.:500; IV Piggyback:168.9] Out: 1525 [Urine:1525] Intake/Output this shift: No intake/output data recorded.  Physical exam:  NAD, elderly male ABd: soft, nt not distender  Lab Results: CBC  Recent Labs    04/19/22 0531 04/20/22 0524  WBC 12.6* 11.1*  HGB 10.5* 10.5*  HCT 31.7* 32.1*  PLT 167 175   BMET Recent Labs    04/19/22 0531 04/20/22 0524  NA 130* 131*  K 4.2 4.4  CL 103 103  CO2 19* 19*  GLUCOSE 95 85  BUN 26* 28*  CREATININE 1.38* 1.52*  CALCIUM 8.4* 8.7*   PT/INR No results for input(s): "LABPROT", "INR" in the last 72 hours. ABG No results for input(s): "PHART", "HCO3" in the last 72 hours.  Invalid input(s): "PCO2", "PO2"  Studies/Results: DG C-Arm 1-60 Min-No Report  Result Date: 04/19/2022 Fluoroscopy was utilized by the requesting physician.  No radiographic interpretation.    Anti-infectives: Anti-infectives (From admission, onward)    Start     Dose/Rate Route Frequency Ordered Stop   04/17/22 1600  piperacillin-tazobactam (ZOSYN) IVPB 3.375 g        3.375 g 12.5 mL/hr over 240 Minutes Intravenous Every 8 hours 04/17/22 1551     04/16/22 2345  piperacillin-tazobactam (ZOSYN) IVPB 3.375 g        3.375 g 100 mL/hr over 30 Minutes Intravenous  Once 04/16/22 2330 04/17/22 0116       Assessment/Plan: History of choledocholithiasis status post  ERCP.  In theory cholecystectomy is indicated but he is 88 years old with limited physiologic reserve.  I definitely do not recommend cholecystectomy during this hospitalization given his fragility. I had an extensive discussion with the patient and family.  I will be happy to see him in about a month out at that time revisit once again role for cholecystectomy.  They understand that he is at risk of developing issues with his his gallbladder as well as choledocholithiasis but I think at this point doing a cholecystectomy will harm him I spent 55 minutes in this encounter including personally reviewing imaging studies, or coordinating his care, placing orders, providing extensive counseling to the patient and family and performing appropriate documentation  Caroleen Hamman, MD, FACS  04/20/2022

## 2022-04-20 NOTE — Plan of Care (Signed)

## 2022-04-20 NOTE — Progress Notes (Signed)
MD order received in [facility] to discharge pt home today; verbally reviewed AVS with pt and pt's daughter; no new Rxs; pt to call on Monday, 04/22/22 to schedule follow up appointments with Dr Rosario Jacks (PCP) and Dr Dahlia Byes as instructed on the AVS; no questions voiced at this time; pt's discharge pending arrival of his son-in-law for a ride home

## 2022-04-20 NOTE — TOC Transition Note (Signed)
Transition of Care [facility]) - CM/SW Discharge Note   Patient Details  Name: HAMDI KLEY MRN: [id number] Date of Birth: May 28, 1930  Transition of Care [facility]) CM/SW Contact:  Candie Chroman, LCSW Phone Number: 04/20/2022, 3:20 PM   Clinical Narrative:   Patient has orders to discharge home today. No further concerns. CSW signing off.  Final next level of care: Home/Self Care Barriers to Discharge: No Barriers Identified   Patient Goals and CMS Choice        Discharge Placement                    Patient and family notified of of transfer: 04/20/22  Discharge Plan and Services                                     Social Determinants of Health (SDOH) Interventions     Readmission Risk Interventions     No data to display

## 2022-04-20 NOTE — Discharge Summary (Signed)
Physician Discharge Summary  Samuel Mack XBM:[id number] DOB: Dec 12, 1929 DOA: 04/16/2022  PCP: Casilda Carls, MD  Admit date: 04/16/2022 Discharge date: 04/20/2022 Discharging to: home Recommendations for Outpatient Follow-up:  Please check Bmet at next visit- Aldactone on hold for now  Consults:  GI General surgery Procedures:  ERCP and spincterotomy   Discharge Diagnoses:   Principal Problem:   Calculus of common duct Active Problems:   AKI (acute kidney injury) ([place])   Elevated LFTs   Sinus bradycardia   Hypothyroidism   Essential hypertension   Dyslipidemia   Pacemaker   SSS (sick sinus syndrome) (HCC)   Stage 3b chronic kidney disease (CKD) [facility])     Hospital Course:   This is a 88 year old male with CKD 3B, hypothyroidism, coronary artery disease, sick sinus syndrome with pacemaker, diabetes mellitus type 2, asthma and hypertension.  The patient presented to the hospital for nausea vomiting and "shaking.  EMS noted that he was bradycardic with shaking and diaphoresis. According to the HPI, the patient had a problem with his pacemaker which was adjusted and subsequently did not have any issues with bradycardia In the ED, lab work revealed elevated LFTs, WBC count of 15.8. And pelvis with contrast:Noncalcified structure within the gallbladder as seen on the ultrasound. Mild biliary ductal dilatation and fatty liver    Assessment and Plan:  Elevated LFTs-distal CBD obstruction -he stated that he has had vomiting after breakfast every day for about 3 weeks now. - HIDA scan  positive for an obstruction in the distal CBD - started on Zosyn -WBC increased from 15.8-20.7 and subsequently improved to 12.8 - LFTs improving  -ERCP reveals choledocholithiasis-sphincterotomy done and stone removal - tolerating low fat diet - general surgery does not feel he is a candidate for surgery due to fragility- plan to f/u as outpt    Active Problems:   Sinus bradycardia -Appreciate  cardiology evaluation - Pacemaker has been interrogated and no abnormalities noted per cardiology   Non anion gap metabolic acidosis -Likely due to acute infection     Hypothyroidism -Continue Synthroid   Essential hypertension -Continue to hold Aldactone  - Continue Cardizem and Bystolic     Dyslipidemia -Hold Lipitor for now     AKI with Stage IIIb chronic kidney disease ([place]) -Creatinine 1.9> 1.5 > 1.38> 1.52 - Aldactone held- will cont to hold    Body mass index is 33.52 kg/m. Nutrition Status:          Discharge Instructions  Discharge Instructions     Diet - low sodium heart healthy   Complete by: As directed    Increase activity slowly   Complete by: As directed       Allergies as of 04/20/2022       Reactions   Bee Venom Swelling   Penicillins Swelling   Patient reports he swells and it was "something in the pcn" 50 year ago        Medication List     STOP taking these medications    nitrofurantoin (macrocrystal-monohydrate) 100 MG capsule Commonly known as: MACROBID   spironolactone 25 MG tablet Commonly known as: ALDACTONE       TAKE these medications    acetaminophen 650 MG CR tablet Commonly known as: Tylenol 8 Hour Take 1 tablet (650 mg total) by mouth every 8 (eight) hours as needed for pain.   ascorbic acid 500 MG tablet Commonly known as: VITAMIN C Take 500 mg by mouth daily.   aspirin 81 MG  tablet Take 81 mg by mouth daily.   atorvastatin 10 MG tablet Commonly known as: LIPITOR Take 10 mg by mouth at bedtime.   cholecalciferol 25 MCG (1000 UNIT) tablet Commonly known as: VITAMIN D3 Take 1,000 Units by mouth in the morning and at bedtime.   Coenzyme Q10 100 MG Tabs Take 100 mg by mouth daily.   cyanocobalamin 1000 MCG tablet Commonly known as: VITAMIN B12 Take 1,000 mcg by mouth daily.   digoxin 0.125 MG tablet Commonly known as: LANOXIN TAKE 1 TABLET BY MOUTH EVERY DAY   diltiazem 180 MG 24 hr  capsule Commonly known as: DILACOR XR Take 180 mg by mouth daily.   Fish Oil 1200 MG Caps Take 1,200 mg by mouth 3 (three) times daily.   levocetirizine 5 MG tablet Commonly known as: XYZAL Take 5 mg by mouth every evening.   levothyroxine 175 MCG tablet Commonly known as: SYNTHROID Take 175 mcg by mouth daily before breakfast.   losartan 100 MG tablet Commonly known as: COZAAR Take 100 mg by mouth daily.   nebivolol 10 MG tablet Commonly known as: BYSTOLIC Take 10 mg by mouth daily.   potassium gluconate 595 (99 K) MG Tabs tablet Take 595 mg by mouth daily.   PRESERVISION AREDS PO Take by mouth 2 (two) times daily.   vitamin E 180 MG (400 UNITS) capsule Take 400 Units by mouth daily.        Follow-up Information     P[place]on, [place] F, MD Follow up in 4 week(s).   Specialty: General Surgery Contact information: [address] [place] [address] [phone]                    The results of significant diagnostics from this hospitalization (including imaging, microbiology, ancillary and laboratory) are listed below for reference.    DG C-Arm 1-60 Min-No Report  Result Date: 04/19/2022 Fluoroscopy was utilized by the requesting physician.  No radiographic interpretation.   ECHOCARDIOGRAM COMPLETE  Result Date: 04/18/2022    ECHOCARDIOGRAM REPORT   Patient Name:   Samuel Mack Date of Exam: 04/18/2022 Medical Rec #:  [id number]      Height:       68.0 in Accession #:    [id number]     Weight:       220.5 lb Date of Birth:  Nov 07, 1929       BSA:          2.130 m Patient Age:    76 years       BP:           128/82 mmHg Patient Gender: M              HR:           80 bpm. Exam Location:  [facility] Procedure: 2D Echo, Color Doppler, Cardiac Doppler and Intracardiac            Opacification Agent Indications:     R06.00 Dyspnea; Bradycardia  History:         Patient has prior history of Echocardiogram examinations, most                  recent  08/15/2015. Previous Myocardial Infarction, Pacemaker;                  Risk Factors:Hypertension, Diabetes and Dyslipidemia.  Sonographer:     Charmayne Sheer Referring Phys:  [id number] [place] Diagnosing Phys: Ida Rogue MD  Sonographer Comments: Suboptimal apical window. IMPRESSIONS  1. Left ventricular ejection fraction, by estimation, is 55 to 60%. The left ventricle has normal function. The left ventricle has no regional wall motion abnormalities. Left ventricular diastolic parameters are indeterminate.  2. Right ventricular systolic function is normal. The right ventricular size is normal. There is mildly elevated pulmonary artery systolic pressure. The estimated right ventricular systolic pressure is 58.0 mmHg.  3. Left atrial size was mildly dilated.  4. The mitral valve is normal in structure. Mild to moderate mitral valve regurgitation. No evidence of mitral stenosis.  5. Tricuspid valve regurgitation is moderate.  6. The aortic valve is normal in structure. Aortic valve regurgitation is mild. Aortic valve sclerosis is present, with no evidence of aortic valve stenosis.  7. The inferior vena cava is dilated in size with >50% respiratory variability, suggesting right atrial pressure of 8 mmHg. FINDINGS  Left Ventricle: Left ventricular ejection fraction, by estimation, is 55 to 60%. The left ventricle has normal function. The left ventricle has no regional wall motion abnormalities. Definity contrast agent was given IV to delineate the left ventricular  endocardial borders. The left ventricular internal cavity size was normal in size. There is no left ventricular hypertrophy. Left ventricular diastolic parameters are indeterminate. Right Ventricle: The right ventricular size is normal. No increase in right ventricular wall thickness. Right ventricular systolic function is normal. There is mildly elevated pulmonary artery systolic pressure. The tricuspid regurgitant velocity is 2.94  m/s, and with an  assumed right atrial pressure of 10 mmHg, the estimated right ventricular systolic pressure is 99.8 mmHg. Left Atrium: Left atrial size was mildly dilated. Right Atrium: Right atrial size was normal in size. Pericardium: There is no evidence of pericardial effusion. Mitral Valve: The mitral valve is normal in structure. Mild to moderate mitral valve regurgitation. No evidence of mitral valve stenosis. Tricuspid Valve: The tricuspid valve is normal in structure. Tricuspid valve regurgitation is moderate . No evidence of tricuspid stenosis. Aortic Valve: The aortic valve is normal in structure. Aortic valve regurgitation is mild. Aortic valve sclerosis is present, with no evidence of aortic valve stenosis. Aortic valve mean gradient measures 6.0 mmHg. Aortic valve peak gradient measures 11.0 mmHg. Aortic valve area, by VTI measures 1.52 cm. Pulmonic Valve: The pulmonic valve was normal in structure. Pulmonic valve regurgitation is mild. No evidence of pulmonic stenosis. Aorta: The aortic root is normal in size and structure. Venous: The inferior vena cava is dilated in size with greater than 50% respiratory variability, suggesting right atrial pressure of 8 mmHg. IAS/Shunts: No atrial level shunt detected by color flow Doppler. Additional Comments: A device lead is visualized.  LEFT VENTRICLE PLAX 2D LVIDd:         4.43 cm   Diastology LVIDs:         3.12 cm   LV e' medial:    10.10 cm/s LV PW:         1.23 cm   LV E/e' medial:  11.2 LV IVS:        0.87 cm   LV e' lateral:   13.60 cm/s LVOT diam:     2.00 cm   LV E/e' lateral: 8.3 LV SV:         45 LV SV Index:   21 LVOT Area:     3.14 cm  RIGHT VENTRICLE RV Basal diam:  4.47 cm LEFT ATRIUM           Index  RIGHT ATRIUM           Index LA diam:      4.10 cm 1.92 cm/m   RA Area:     22.20 cm LA Vol (A4C): 74.1 ml 34.79 ml/m  RA Volume:   63.80 ml  29.95 ml/m  AORTIC VALVE                     PULMONIC VALVE AV Area (Vmax):    1.56 cm      PV Vmax:           0.94 m/s AV Area (Vmean):   1.34 cm      PV Peak grad:     3.5 mmHg AV Area (VTI):     1.52 cm      PR End Diast Vel: 2.11 msec AV Vmax:           166.00 cm/s AV Vmean:          120.000 cm/s AV VTI:            0.298 m AV Peak Grad:      11.0 mmHg AV Mean Grad:      6.0 mmHg LVOT Vmax:         82.50 cm/s LVOT Vmean:        51.000 cm/s LVOT VTI:          0.144 m LVOT/AV VTI ratio: 0.48  AORTA Ao Root diam: 2.90 cm MITRAL VALVE                TRICUSPID VALVE MV Area (PHT): 3.24 cm     TR Peak grad:   34.6 mmHg MV Decel Time: 234 msec     TR Vmax:        294.00 cm/s MV E velocity: 112.67 cm/s                             SHUNTS                             Systemic VTI:  0.14 m                             Systemic Diam: 2.00 cm Ida Rogue MD Electronically signed by Ida Rogue MD Signature Date/Time: 04/18/2022/3:54:39 PM    Final    NM Hepato W/EF  Result Date: 04/17/2022 CLINICAL DATA:  Concern for biliary obstruction. Elevated bilirubin. Potential obstructing calculus in the distal common bile duct on CT exam. EXAM: NUCLEAR MEDICINE HEPATOBILIARY IMAGING TECHNIQUE: Sequential images of the abdomen were obtained out to 60 minutes following intravenous administration of radiopharmaceutical. RADIOPHARMACEUTICALS:  6.8 mCi Tc-56m Choletec IV COMPARISON:  CT 04/16/2022 FINDINGS: There is delayed clearance radiotracer from the blood pool. There is homogeneous uptake within liver. There is delayed excretion of contrast in the common bile duct. The common bile duct appears at 30 minutes. Imaging upto 70 minutes demonstrates no passage of excreted radiotracer beyond the distal common bile duct into the duodenum. No gallbladder filling (cystic duct patency not confidently evaluated on the background of common bile duct obstruction). IMPRESSION: Findings most consistent with distal common bile duct obstruction. Potential obstructing lesion identified on comparison CT. Consider MRCP for further evaluation versus ERCP  for clearance of obstruction. Filling of the gallbladder cannot be adequately assessed on the background  of common bile duct obstruction. These results will be called to the ordering clinician or representative by the Radiologist Assistant, and communication documented in the PACS or [employer]. Electronically Signed   By: Suzy Bouchard M.D.   On: 04/17/2022 13:05   CT Abdomen Pelvis W Contrast  Result Date: 04/16/2022 CLINICAL DATA:  Abdominal pain, nausea vomiting. EXAM: CT ABDOMEN AND PELVIS WITH CONTRAST TECHNIQUE: Multidetector CT imaging of the abdomen and pelvis was performed using the standard protocol following bolus administration of intravenous contrast. RADIATION DOSE REDUCTION: This exam was performed according to the departmental dose-optimization program which includes automated exposure control, adjustment of the mA and/or kV according to patient size and/or use of iterative reconstruction technique. CONTRAST:  6m OMNIPAQUE IOHEXOL 300 MG/ML  SOLN COMPARISON:  Right upper quadrant ultrasound dated 04/16/2022. FINDINGS: Evaluation of this exam is limited due to respiratory motion artifact. Lower chest: Minimal bibasilar subpleural atelectasis. The visualized lung bases are otherwise clear. There is coronary vascular calcification. Cardiac pacemaker wires noted. No intra-abdominal free air or free fluid. Hepatobiliary: Fatty liver. There is mild intrahepatic biliary ductal dilatation. A noncalcified rounded density within the gallbladder corresponds to the structure seen on the ultrasound. No pericholecystic fluid. Noncalcified stones may be present in the central CBD (coronal 46/5) but not evaluated on this CT. MRCP may provide better evaluation if clinically indicated. Pancreas: Moderately atrophic. No active inflammatory changes. No dilatation of the main pancreatic duct. Spleen: Normal in size without focal abnormality. Adrenals/Urinary Tract: The adrenal glands unremarkable. There is  no hydronephrosis on either side. There is symmetric enhancement and excretion of contrast by both kidneys. Bilateral renal cysts measure up to 5.5 cm in the inferior pole of the left kidney. Several smaller hypodense lesions are not characterized. The visualized ureters and urinary bladder appear unremarkable. Stomach/Bowel: Moderate stool throughout the colon. There is no bowel obstruction or active inflammation. Normal appendix. Vascular/Lymphatic: Advanced aortoiliac atherosclerotic disease. The IVC is unremarkable. No portal venous gas. There is no adenopathy. Reproductive: The prostate and seminal vesicles are grossly unremarkable. No pelvic mass. Other: None Musculoskeletal: Osteopenia with degenerative changes of the spine. No acute osseous pathology. IMPRESSION: 1. No bowel obstruction. Normal appendix. 2. Noncalcified structure within the gallbladder as seen on the ultrasound. Mild biliary ductal dilatation. The presence of noncalcified stone in the central CBD is not excluded. Evaluation is very limited due to respiratory motion. MRCP may provide better evaluation if clinically indicated. 3. Fatty liver. 4. Aortic Atherosclerosis (ICD10-I70.0). Electronically Signed   By: AAnner CreteM.D.   On: 04/16/2022 23:10   UKoreaAbdomen Limited RUQ (LIVER/GB)  Result Date: 04/16/2022 CLINICAL DATA:  Elevated LFTs EXAM: ULTRASOUND ABDOMEN LIMITED RIGHT UPPER QUADRANT COMPARISON:  None Available. FINDINGS: Gallbladder: Gallbladder is well distended with a 1.2 cm echogenic focus which is non mobile near the gallbladder neck. This may represent a sludge ball although polyp would deserve consideration as well. No cholelithiasis is noted. Common bile duct: Diameter: 4.6 mm. Liver: No focal lesion identified. Within normal limits in parenchymal echogenicity. Portal vein is patent on color Doppler imaging with normal direction of blood flow towards the liver. Other: None. IMPRESSION: Non mobile echogenic focus within  the gallbladder as described. Follow-up can be performed as clinically indicated given the patient's advanced age. Electronically Signed   By: MInez CatalinaM.D.   On: 04/16/2022 20:26   DG Chest Port 1 View  Result Date: 04/16/2022 CLINICAL DATA:  Pt brought in by EMS today for shaking  that started today. EMS sts that they put pt on the monitor and noticed that his HR is ranging from 30-100 bpm. Pt has a pacemaker that was replaced about four years ago. Per EMS pt is having long sinus pauses. EXAM: PORTABLE CHEST - 1 VIEW COMPARISON:  05/10/2019 FINDINGS: Relatively low lung volumes with crowding of bronchovascular structures at the lung bases. No definite confluent airspace disease. Heart size and mediastinal contours are within normal limits. Aortic Atherosclerosis (ICD10-170.0). Stable left subclavian transvenous pacemaker. No effusion. Visualized bones unremarkable. IMPRESSION: Low volumes with bibasilar opacities.  No definite acute disease. Electronically Signed   By: Lucrezia Europe M.D.   On: 04/16/2022 18:36   Labs:   Basic Metabolic Panel: Recent Labs  Lab 04/16/22 1824 04/17/22 0417 04/18/22 0540 04/19/22 0531 04/20/22 0524  NA 135 131* 131* 130* 131*  K 5.1 4.9 4.8 4.2 4.4  CL 99 102 103 103 103  CO2 24 21* 19* 19* 19*  GLUCOSE 132* 98 91 95 85  BUN 30* 32* 30* 26* 28*  CREATININE 1.66* 1.90* 1.52* 1.38* 1.52*  CALCIUM 9.3 8.3* 8.4* 8.4* 8.7*     CBC: Recent Labs  Lab 04/16/22 1824 04/17/22 0417 04/18/22 0540 04/19/22 0531 04/20/22 0524  WBC 15.8* 20.7* 12.8* 12.6* 11.1*  NEUTROABS 12.6*  --   --   --   --   HGB 12.6* 11.1* 10.5* 10.5* 10.5*  HCT 39.5 33.7* 31.8* 31.7* 32.1*  MCV 92.7 91.1 90.1 88.8 89.4  PLT 252 180 159 167 175         SIGNED:   Debbe Odea, MD  [facility] 04/20/2022, 3:19 PM

## 2022-04-22 ENCOUNTER — Ambulatory Visit (INDEPENDENT_AMBULATORY_CARE_PROVIDER_SITE_OTHER): Payer: MEDICARE | Admitting: Internal Medicine

## 2022-04-22 DIAGNOSIS — J301 Allergic rhinitis due to pollen: Secondary | ICD-10-CM

## 2022-04-22 DIAGNOSIS — I10 Essential (primary) hypertension: Secondary | ICD-10-CM | POA: Diagnosis not present

## 2022-04-22 DIAGNOSIS — Z950 Presence of cardiac pacemaker: Secondary | ICD-10-CM

## 2022-04-22 DIAGNOSIS — I495 Sick sinus syndrome: Secondary | ICD-10-CM | POA: Diagnosis not present

## 2022-04-25 NOTE — Progress Notes (Addendum)
Established Patient Office Visit  Subjective:  Patient ID: Samuel Mack, male    DOB: 01/17/1930  Age: 88 y.o. MRN: [id number]  CC:  Chief Complaint  Patient presents with   Pacemaker Check    HPI  Samuel Mack presents for pacemaker check.  Past Medical History:  Diagnosis Date   Asthma    Cancer ([place])    skin   Complication of anesthesia    previous pacemaker patient could feel everything   Diabetes mellitus without complication ([place])    diet controlled   Dyspnea    Dysrhythmia    Elevated lipids    Hypertension    Hypothyroidism    Myocardial infarction [facility])    Presence of permanent cardiac pacemaker     Past Surgical History:  Procedure Laterality Date   CATARACT EXTRACTION W/PHACO Left 12/11/2016   Procedure: CATARACT EXTRACTION PHACO AND INTRAOCULAR LENS PLACEMENT ([place]);  Surgeon: Leandrew Koyanagi, MD;  Location: [place];  Service: Ophthalmology;  Laterality: Left;  IVA TOPICAL LEFT   ERCP N/A 04/19/2022   Procedure: ENDOSCOPIC RETROGRADE CHOLANGIOPANCREATOGRAPHY (ERCP);  Surgeon: Lucilla Lame, MD;  Location: [facility] ENDOSCOPY;  Service: Endoscopy;  Laterality: N/A;   EYE SURGERY Left    cataract   NASAL SINUS SURGERY     PACEMAKER GENERATOR CHANGE Left    x2   PACEMAKER INSERTION  2002   PACEMAKER INSERTION Left 05/12/2019   Procedure: INSERTION PACEMAKER, LEFT;  Surgeon: Cletis Athens, MD;  Location: [facility] ORS;  Service: Cardiovascular;  Laterality: Left;   PROSTATE SURGERY     TRANSURETHRAL RESECTION OF PROSTATE N/A 06/20/2020   Procedure: TRANSURETHRAL RESECTION OF THE PROSTATE (TURP);  Surgeon: [name], MD;  Location: [facility] ORS;  Service: Urology;  Laterality: N/A;    History reviewed. No pertinent family history.  Social History   Socioeconomic History   Marital status: Widowed    Spouse name: Not on file   Number of children: Not on file   Years of education: Not on file   Highest education level: Not on file   Occupational History   Not on file  Tobacco Use   Smoking status: Former    Types: Cigarettes    Quit date: 01/26/1979    Years since quitting: 43.2   Smokeless tobacco: Never  Vaping Use   Vaping Use: Never used  Substance and Sexual Activity   Alcohol use: No   Drug use: No   Sexual activity: Not on file  Other Topics Concern   Not on file  Social History Narrative   Not on file   Social Determinants of Health   Financial Resource Strain: Not on file  Food Insecurity: Not on file  Transportation Needs: Not on file  Physical Activity: Not on file  Stress: Not on file  Social Connections: Not on file  Intimate Partner Violence: Not on file     Current Outpatient Medications:    acetaminophen (TYLENOL 8 HOUR) 650 MG CR tablet, Take 1 tablet (650 mg total) by mouth every 8 (eight) hours as needed for pain., Disp: 2 tablet, Rfl: 1   aspirin 81 MG tablet, Take 81 mg by mouth daily., Disp: , Rfl:    atorvastatin (LIPITOR) 10 MG tablet, Take 10 mg by mouth at bedtime., Disp: , Rfl:    cholecalciferol (VITAMIN D) 25 MCG (1000 UNIT) tablet, Take 1,000 Units by mouth in the morning and at bedtime., Disp: , Rfl:    Coenzyme Q10 100 MG TABS,  Take 100 mg by mouth daily. , Disp: , Rfl:    digoxin (LANOXIN) 0.125 MG tablet, TAKE 1 TABLET BY MOUTH EVERY DAY, Disp: 90 tablet, Rfl: 3   diltiazem (DILACOR XR) 180 MG 24 hr capsule, Take 180 mg by mouth daily., Disp: , Rfl:    levocetirizine (XYZAL) 5 MG tablet, Take 5 mg by mouth every evening., Disp: , Rfl:    levothyroxine (SYNTHROID) 175 MCG tablet, Take 175 mcg by mouth daily before breakfast. , Disp: , Rfl:    losartan (COZAAR) 100 MG tablet, Take 100 mg by mouth daily., Disp: , Rfl:    Multiple Vitamins-Minerals (PRESERVISION AREDS PO), Take by mouth 2 (two) times daily., Disp: , Rfl:    nebivolol (BYSTOLIC) 10 MG tablet, Take 10 mg by mouth daily., Disp: , Rfl:    Omega-3 Fatty Acids (FISH OIL) 1200 MG CAPS, Take 1,200 mg by mouth 3  (three) times daily., Disp: , Rfl:    potassium gluconate 595 (99 K) MG TABS tablet, Take 595 mg by mouth daily., Disp: , Rfl:    vitamin B-12 (CYANOCOBALAMIN) 1000 MCG tablet, Take 1,000 mcg by mouth daily., Disp: , Rfl:    vitamin C (ASCORBIC ACID) 500 MG tablet, Take 500 mg by mouth daily., Disp: , Rfl:    vitamin E 180 MG (400 UNITS) capsule, Take 400 Units by mouth daily., Disp: , Rfl:    Allergies  Allergen Reactions   Bee Venom Swelling   Penicillins Swelling    Patient reports he swells and it was "something in the pcn" 50 year ago    ROS Review of Systems  Constitutional: Negative.   HENT: Negative.    Eyes: Negative.   Respiratory: Negative.    Cardiovascular: Negative.   Gastrointestinal: Negative.   Endocrine: Negative.   Genitourinary: Negative.   Musculoskeletal: Negative.   Skin: Negative.   Allergic/Immunologic: Negative.   Neurological: Negative.   Hematological: Negative.   Psychiatric/Behavioral: Negative.    All other systems reviewed and are negative.     Objective:    Physical Exam Vitals reviewed.  Constitutional:      Appearance: Normal appearance.  HENT:     Mouth/Throat:     Mouth: Mucous membranes are moist.  Eyes:     Pupils: Pupils are equal, round, and reactive to light.  Neck:     Vascular: No carotid bruit.  Cardiovascular:     Rate and Rhythm: Normal rate and regular rhythm.     Pulses: Normal pulses.     Heart sounds: Normal heart sounds.  Pulmonary:     Effort: Pulmonary effort is normal.     Breath sounds: Normal breath sounds.  Abdominal:     General: Bowel sounds are normal.     Palpations: Abdomen is soft. There is no hepatomegaly, splenomegaly or mass.     Tenderness: There is no abdominal tenderness.     Hernia: No hernia is present.  Musculoskeletal:     Cervical back: Neck supple.     Right lower leg: No edema.     Left lower leg: No edema.  Skin:    Findings: No rash.  Neurological:     Mental Status: He is  alert and oriented to person, place, and time.     Motor: No weakness.  Psychiatric:        Mood and Affect: Mood normal.        Behavior: Behavior normal.     There were no vitals taken for  this visit. Wt Readings from Last 3 Encounters:  04/16/22 220 lb 7.4 oz (100 kg)  01/21/22 236 lb (107 kg)  10/22/21 236 lb (107 kg)     Health Maintenance Due  Topic Date Due   TETANUS/TDAP  Never done   Zoster Vaccines- Shingrix (1 of 2) Never done   Pneumonia Vaccine 92+ Years old (1 - PCV) Never done   COVID-19 Vaccine (4 - Pfizer series) 09/20/2020   INFLUENZA VACCINE  04/16/2022    There are no preventive care reminders to display for this patient.  No results found for: "TSH" Lab Results  Component Value Date   WBC 11.1 (H) 04/20/2022   HGB 10.5 (L) 04/20/2022   HCT 32.1 (L) 04/20/2022   MCV 89.4 04/20/2022   PLT 175 04/20/2022   Lab Results  Component Value Date   NA 131 (L) 04/20/2022   K 4.4 04/20/2022   CO2 19 (L) 04/20/2022   GLUCOSE 85 04/20/2022   BUN 28 (H) 04/20/2022   CREATININE 1.52 (H) 04/20/2022   BILITOT 1.7 (H) 04/20/2022   ALKPHOS 205 (H) 04/20/2022   AST 47 (H) 04/20/2022   ALT 127 (H) 04/20/2022   PROT 6.4 (L) 04/20/2022   ALBUMIN 3.2 (L) 04/20/2022   CALCIUM 8.7 (L) 04/20/2022   ANIONGAP 9 04/20/2022   No results found for: "CHOL" No results found for: "HDL" No results found for: "LDLCALC" No results found for: "TRIG" No results found for: "CHOLHDL" No results found for: "HGBA1C"    Assessment & Plan:   Problem List Items Addressed This Visit       Cardiovascular and Mediastinum   SSS (sick sinus syndrome) ([place])    Patient is a problem with tachyarrhythmia related 24-hour Holter monitor.      Essential hypertension     Patient denies any chest pain or shortness of breath there is no history of palpitation or paroxysmal nocturnal dyspnea   patient was advised to follow low-salt low-cholesterol diet    ideally I want to keep  systolic blood pressure below 130 mmHg, patient was asked to check blood pressure one times a week and give me a report on that.  Patient will be follow-up in 3 months  or earlier as needed, patient will call me back for any change in the cardiovascular symptoms Patient was advised to buy a book from local bookstore concerning blood pressure and read several chapters  every day.  This will be supplemented by some of the material we will give him from the office.  Patient should also utilize other resources like YouTube and Internet to learn more about the blood pressure and the diet.        Respiratory   Seasonal allergic rhinitis due to pollen    Stable        Other   Pacemaker    Note: Medical Device Follow-up  Patient pacemaker was interrogated by pacemakers analyzer, battery status is okay.  No programming changes were indicated after the review of the data.  Histogram shows no change since the last interrogation Atrial and ventricular sensing thresholds were found to be acceptable Impedance was checked and it was found to be normal.  Thresholds were found to be okay on evaluation of rhythm problem.  No high rate or low rate arrhythmia were noted.  Estimated battery longevity is 7.4  yr. I have personally reviewed the device data and amended the report as necessary.       Other Visit Diagnoses  Cardiac pacemaker in situ    -  Primary   Relevant Orders   PACEMAKER IN CLINIC CHECK   EKG 12-Lead (Completed)     Report of the electrocardiogram. Electrocardiogram.  Pacemaker rhythm no acute changes are noted  No orders of the defined types were placed in this encounter.   Follow-up: No follow-ups on file.    Cletis Athens, MD

## 2022-04-25 NOTE — Assessment & Plan Note (Signed)
Patient is a problem with tachyarrhythmia related 24-hour Holter monitor.

## 2022-04-25 NOTE — Assessment & Plan Note (Signed)

## 2022-04-25 NOTE — Assessment & Plan Note (Signed)
Stable

## 2022-04-25 NOTE — Assessment & Plan Note (Signed)
Note: Medical Device Follow-up  Patient pacemaker was interrogated by pacemakers analyzer, battery status is okay.  No programming changes were indicated after the review of the data.  Histogram shows no change since the last interrogation Atrial and ventricular sensing thresholds were found to be acceptable Impedance was checked and it was found to be normal.  Thresholds were found to be okay on evaluation of rhythm problem.  No high rate or low rate arrhythmia were noted.  Estimated battery longevity is 7.4  yr. I have personally reviewed the device data and amended the report as necessary.

## 2022-05-27 ENCOUNTER — Ambulatory Visit (INDEPENDENT_AMBULATORY_CARE_PROVIDER_SITE_OTHER): Payer: MEDICARE | Admitting: Surgery

## 2022-05-27 ENCOUNTER — Encounter: Payer: Self-pay | Admitting: Surgery

## 2022-05-27 ENCOUNTER — Telehealth: Payer: Self-pay

## 2022-05-27 VITALS — BP 128/74 | HR 99 | Temp 97.7°F | Ht 67.5 in | Wt 211.4 lb

## 2022-05-27 DIAGNOSIS — K8020 Calculus of gallbladder without cholecystitis without obstruction: Secondary | ICD-10-CM

## 2022-05-27 DIAGNOSIS — K8050 Calculus of bile duct without cholangitis or cholecystitis without obstruction: Secondary | ICD-10-CM | POA: Diagnosis not present

## 2022-05-27 NOTE — Telephone Encounter (Signed)
Faxed medical clearance to Dr. Rosario Jacks at [phone].

## 2022-05-27 NOTE — Telephone Encounter (Signed)
Faxed cardiac clearance to Dr. Lavera Guise.

## 2022-05-27 NOTE — Patient Instructions (Addendum)
Our surgery scheduler Barbara will call you within 24-48 hours to get you scheduled. If you have not heard from her after 48 hours, please call our office. Have the blue sheet available when she calls to write down important information.   If you have any concerns or questions, please feel free to call our office.    Minimally Invasive Cholecystectomy  Minimally invasive cholecystectomy is surgery to remove the gallbladder. The gallbladder is a pear-shaped organ that lies beneath the liver on the right side of the body. The gallbladder stores bile, which is a fluid that helps the body digest fats. Cholecystectomy is often done to treat inflammation (irritation and swelling) of the gallbladder (cholecystitis). This condition is usually caused by a buildup of gallstones (cholelithiasis) in the gallbladder or when the fluid in the gall bladder becomes stagnant because gallstones get stuck in the ducts (tubes) and block the flow of bile. This can result in inflammation and pain. In severe cases, emergency surgery may be required. This procedure is done through small incisions in the abdomen, instead of one large incision. It is also called laparoscopic surgery. A thin scope with a camera (laparoscope) is inserted through one incision. Then surgical instruments are inserted through the other incisions. In some cases, a minimally invasive surgery may need to be changed to a surgery that is done through a larger incision. This is called open surgery. Tell a health care provider about: Any allergies you have. All medicines you are taking, including vitamins, herbs, eye drops, creams, and over-the-counter medicines. Any problems you or family members have had with anesthetic medicines. Any bleeding problems you have. Any surgeries you have had. Any medical conditions you have. Whether you are pregnant or may be pregnant. What are the risks? Generally, this is a safe procedure. However, problems may occur,  including: Infection. Bleeding. Allergic reactions to medicines. Damage to nearby structures or organs. A gallstone remaining in the common bile duct. The common bile duct carries bile from the gallbladder to the small intestine. A bile leak from the liver or cystic duct after your gallbladder is removed. What happens before the procedure? When to stop eating and drinking Follow instructions from your health care provider about what you may eat and drink before your procedure. These may include: 8 hours before the procedure Stop eating most foods. Do not eat meat, fried foods, or fatty foods. Eat only light foods, such as toast or crackers. All liquids are okay except energy drinks and alcohol. 6 hours before the procedure Stop eating. Drink only clear liquids, such as water, clear fruit juice, black coffee, plain tea, and sports drinks. Do not drink energy drinks or alcohol. 2 hours before the procedure Stop drinking all liquids. You may be allowed to take medicines with small sips of water. If you do not follow your health care provider's instructions, your procedure may be delayed or canceled. Medicines Ask your health care provider about: Changing or stopping your regular medicines. This is especially important if you are taking diabetes medicines or blood thinners. Taking medicines such as aspirin and ibuprofen. These medicines can thin your blood. Do not take these medicines unless your health care provider tells you to take them. Taking over-the-counter medicines, vitamins, herbs, and supplements. General instructions If you will be going home right after the procedure, plan to have a responsible adult: Take you home from the hospital or clinic. You will not be allowed to drive. Care for you for the time you are   told. Do not use any products that contain nicotine or tobacco for at least 4 weeks before the procedure. These products include cigarettes, chewing tobacco, and vaping  devices, such as e-cigarettes. If you need help quitting, ask your health care provider. Ask your health care provider: How your surgery site will be marked. What steps will be taken to help prevent infection. These may include: Removing hair at the surgery site. Washing skin with a germ-killing soap. Taking antibiotic medicine. What happens during the procedure?  An IV will be inserted into one of your veins. You will be given one or both of the following: A medicine to help you relax (sedative). A medicine to make you fall asleep (general anesthetic). Your surgeon will make several small incisions in your abdomen. The laparoscope will be inserted through one of the small incisions. The camera on the laparoscope will send images to a monitor in the operating room. This lets your surgeon see inside your abdomen. A gas will be pumped into your abdomen. This will expand your abdomen to give the surgeon more room to perform the surgery. Other tools that are needed for the procedure will be inserted through the other incisions. The gallbladder will be removed through one of the incisions. Your common bile duct may be examined. If stones are found in the common bile duct, they may be removed. After your gallbladder has been removed, the incisions will be closed with stitches (sutures), staples, or skin glue. Your incisions will be covered with a bandage (dressing). The procedure may vary among health care providers and hospitals. What happens after the procedure? Your blood pressure, heart rate, breathing rate, and blood oxygen level will be monitored until you leave the hospital or clinic. You will be given medicines as needed to control your pain. You may have a drain placed in the incision. The drain will be removed a day or two after the procedure. Summary Minimally invasive cholecystectomy, also called laparoscopic cholecystectomy, is surgery to remove the gallbladder using small  incisions. Tell your health care provider about all the medical conditions you have and all the medicines you are taking for those conditions. Before the procedure, follow instructions about when to stop eating and drinking and changing or stopping medicines. Plan to have a responsible adult care for you for the time you are told after you leave the hospital or clinic. This information is not intended to replace advice given to you by your health care provider. Make sure you discuss any questions you have with your health care provider. Document Revised: 03/06/2021 Document Reviewed: 03/06/2021 Elsevier Patient Education  2023 Elsevier Inc.  

## 2022-05-27 NOTE — Progress Notes (Signed)
Outpatient Surgical Follow Up  05/27/2022  Samuel Mack is an 88 y.o. male.   Chief Complaint  Patient presents with   New Patient (Initial Visit)    Gallstones    HPI: 88 year old male well-known to me with a recent hospitalization about a month ago due to ascending cholangitis from common bile duct stone requiring admission to the hospital antibiotics hydration and prompt ERCP.  He recovered well.  He has been home for the last several weeks and actually is better than baseline.  He has been able to walk without shortness of breath or chest pain.  He used to use a walker before now he is walking dependently.  He comes accompanied by his daughter.  I did have extensive discussions back when she was sick in the hospital.  At that time he was too weak and his mentation was not there and we decided not to perform any urgent surgical intervention due to his overall health.  Now he seems to have improved dramatically.  His CBC shows a hemoglobin of 10.5 and creatinine of 1.5. Most probably consultation today was discussing the pros and cons of potential cholecystectomy.  He fully understands the risks  Past Medical History:  Diagnosis Date   Asthma    Cancer ([place])    skin   Complication of anesthesia    previous pacemaker patient could feel everything   Diabetes mellitus without complication ([place])    diet controlled   Dyspnea    Dysrhythmia    Elevated lipids    Hypertension    Hypothyroidism    Myocardial infarction [facility])    Presence of permanent cardiac pacemaker     Past Surgical History:  Procedure Laterality Date   CATARACT EXTRACTION W/PHACO Left 12/11/2016   Procedure: CATARACT EXTRACTION PHACO AND INTRAOCULAR LENS PLACEMENT ([place]);  Surgeon: Leandrew Koyanagi, MD;  Location: [place];  Service: Ophthalmology;  Laterality: Left;  IVA TOPICAL LEFT   ERCP N/A 04/19/2022   Procedure: ENDOSCOPIC RETROGRADE CHOLANGIOPANCREATOGRAPHY (ERCP);  Surgeon: Lucilla Lame, MD;   Location: [facility] ENDOSCOPY;  Service: Endoscopy;  Laterality: N/A;   EYE SURGERY Left    cataract   NASAL SINUS SURGERY     PACEMAKER GENERATOR CHANGE Left    x2   PACEMAKER INSERTION  2002   PACEMAKER INSERTION Left 05/12/2019   Procedure: INSERTION PACEMAKER, LEFT;  Surgeon: Cletis Athens, MD;  Location: [facility] ORS;  Service: Cardiovascular;  Laterality: Left;   PROSTATE SURGERY     TRANSURETHRAL RESECTION OF PROSTATE N/A 06/20/2020   Procedure: TRANSURETHRAL RESECTION OF THE PROSTATE (TURP);  Surgeon: [name], MD;  Location: [facility] ORS;  Service: Urology;  Laterality: N/A;    History reviewed. No pertinent family history.  Social History:  reports that he quit smoking about 43 years ago. His smoking use included cigarettes. He has never used smokeless tobacco. He reports that he does not drink alcohol and does not use drugs.  Allergies:  Allergies  Allergen Reactions   Bee Venom Swelling   Penicillins Swelling    Patient reports he swells and it was "something in the pcn" 50 year ago    Medications reviewed.    ROS Full ROS performed and is otherwise negative other than what is stated in HPI   BP 128/74   Pulse 99   Temp 97.7 F (36.5 C) (Oral)   Ht 5' 7.5" (1.715 m)   Wt 211 lb 6.4 oz (95.9 kg)   SpO2 97%   BMI  32.62 kg/m   Physical Exam Vitals and nursing note reviewed. Exam conducted with a chaperone present.  Constitutional:      General: He is not in acute distress.    Appearance: Normal appearance. He is obese. He is not ill-appearing or toxic-appearing.  Eyes:     General: No scleral icterus.       Right eye: No discharge.        Left eye: No discharge.  Cardiovascular:     Rate and Rhythm: Normal rate and regular rhythm.     Heart sounds: No murmur heard. Pulmonary:     Effort: Pulmonary effort is normal. No respiratory distress.     Breath sounds: Normal breath sounds. No stridor. No wheezing or rhonchi.  Abdominal:     General: Abdomen is  flat. There is no distension.     Palpations: Abdomen is soft. There is no mass.     Tenderness: There is no abdominal tenderness. There is no guarding or rebound.     Hernia: No hernia is present.  Musculoskeletal:     Cervical back: Normal range of motion and neck supple. No rigidity or tenderness.  Lymphadenopathy:     Cervical: No cervical adenopathy.  Skin:    General: Skin is warm and dry.     Capillary Refill: Capillary refill takes less than 2 seconds.  Neurological:     General: No focal deficit present.     Mental Status: He is alert and oriented to person, place, and time.  Psychiatric:        Mood and Affect: Mood normal.        Behavior: Behavior normal.        Thought Content: Thought content normal.        Judgment: Judgment normal.       Assessment/Plan: 88 yo Male with history of cholangitis due to choledocholithiasis.  Persistent gallstones. Normal patient hemodynamically cholecystectomy during previous hospitalization but due to sepsis and advanced age decision was made to postpone the surgery until his strength was better.  Today he comes in and is back to his baseline and actually has regained more of his functional status.  I had an extensive discussion with with the patient and the daughter regarding his disease process.  The role of cholecystectomy.  I do think that we do have a short window before he can get any other potential complications.  Options of doing nothing versus cholecystectomy were explained to the patient in detail.  Each option was given as an alternative and we explained the pros and cons of each decision. He is fully aware that he does have an increased perioperative risk due to his advanced age and multiple comorbidities.  On the other hand he does have significant risk if we do not do anything and this is related to biliary disease. Patient wishes to move forward with robotic cholecystectomy. I discussed the procedure in detail.  The patient  was given Neurosurgeon.  We discussed the risks and benefits of a laparoscopic cholecystectomy and possible cholangiogram including, but not limited to bleeding, infection, injury to surrounding structures such as the intestine or liver, bile leak, retained gallstones, need to convert to an open procedure, prolonged diarrhea, blood clots such as  DVT, common bile duct injury, anesthesia risks, and possible need for additional procedures.  The likelihood of improvement in symptoms and return to the patient's normal status is good. We discussed the typical post-operative recovery course.  I spent more than  45 minutes this encounter including personally reviewing imaging studies, coordinating his care, placing orders, counseling the patient and family extensively and performing appropriate documentation  Caroleen Hamman, MD [place] Surgeon

## 2022-05-27 NOTE — H&P (View-Only) (Signed)
Outpatient Surgical Follow Up  05/27/2022  Samuel Mack is an 88 y.o. male.   Chief Complaint  Patient presents with   New Patient (Initial Visit)    Gallstones    HPI: 88 year old male well-known to me with a recent hospitalization about a month ago due to ascending cholangitis from common bile duct stone requiring admission to the hospital antibiotics hydration and prompt ERCP.  He recovered well.  He has been home for the last several weeks and actually is better than baseline.  He has been able to walk without shortness of breath or chest pain.  He used to use a walker before now he is walking dependently.  He comes accompanied by his daughter.  I did have extensive discussions back when she was sick in the hospital.  At that time he was too weak and his mentation was not there and we decided not to perform any urgent surgical intervention due to his overall health.  Now he seems to have improved dramatically.  His CBC shows a hemoglobin of 10.5 and creatinine of 1.5. Most probably consultation today was discussing the pros and cons of potential cholecystectomy.  He fully understands the risks  Past Medical History:  Diagnosis Date   Asthma    Cancer ([place])    skin   Complication of anesthesia    previous pacemaker patient could feel everything   Diabetes mellitus without complication ([place])    diet controlled   Dyspnea    Dysrhythmia    Elevated lipids    Hypertension    Hypothyroidism    Myocardial infarction [facility])    Presence of permanent cardiac pacemaker     Past Surgical History:  Procedure Laterality Date   CATARACT EXTRACTION W/PHACO Left 12/11/2016   Procedure: CATARACT EXTRACTION PHACO AND INTRAOCULAR LENS PLACEMENT ([place]);  Surgeon: Leandrew Koyanagi, MD;  Location: [place];  Service: Ophthalmology;  Laterality: Left;  IVA TOPICAL LEFT   ERCP N/A 04/19/2022   Procedure: ENDOSCOPIC RETROGRADE CHOLANGIOPANCREATOGRAPHY (ERCP);  Surgeon: Lucilla Lame, MD;   Location: [facility] ENDOSCOPY;  Service: Endoscopy;  Laterality: N/A;   EYE SURGERY Left    cataract   NASAL SINUS SURGERY     PACEMAKER GENERATOR CHANGE Left    x2   PACEMAKER INSERTION  2002   PACEMAKER INSERTION Left 05/12/2019   Procedure: INSERTION PACEMAKER, LEFT;  Surgeon: Cletis Athens, MD;  Location: [facility] ORS;  Service: Cardiovascular;  Laterality: Left;   PROSTATE SURGERY     TRANSURETHRAL RESECTION OF PROSTATE N/A 06/20/2020   Procedure: TRANSURETHRAL RESECTION OF THE PROSTATE (TURP);  Surgeon: [name], MD;  Location: [facility] ORS;  Service: Urology;  Laterality: N/A;    History reviewed. No pertinent family history.  Social History:  reports that he quit smoking about 43 years ago. His smoking use included cigarettes. He has never used smokeless tobacco. He reports that he does not drink alcohol and does not use drugs.  Allergies:  Allergies  Allergen Reactions   Bee Venom Swelling   Penicillins Swelling    Patient reports he swells and it was "something in the pcn" 50 year ago    Medications reviewed.    ROS Full ROS performed and is otherwise negative other than what is stated in HPI   BP 128/74   Pulse 99   Temp 97.7 F (36.5 C) (Oral)   Ht 5' 7.5" (1.715 m)   Wt 211 lb 6.4 oz (95.9 kg)   SpO2 97%   BMI  32.62 kg/m   Physical Exam Vitals and nursing note reviewed. Exam conducted with a chaperone present.  Constitutional:      General: He is not in acute distress.    Appearance: Normal appearance. He is obese. He is not ill-appearing or toxic-appearing.  Eyes:     General: No scleral icterus.       Right eye: No discharge.        Left eye: No discharge.  Cardiovascular:     Rate and Rhythm: Normal rate and regular rhythm.     Heart sounds: No murmur heard. Pulmonary:     Effort: Pulmonary effort is normal. No respiratory distress.     Breath sounds: Normal breath sounds. No stridor. No wheezing or rhonchi.  Abdominal:     General: Abdomen is  flat. There is no distension.     Palpations: Abdomen is soft. There is no mass.     Tenderness: There is no abdominal tenderness. There is no guarding or rebound.     Hernia: No hernia is present.  Musculoskeletal:     Cervical back: Normal range of motion and neck supple. No rigidity or tenderness.  Lymphadenopathy:     Cervical: No cervical adenopathy.  Skin:    General: Skin is warm and dry.     Capillary Refill: Capillary refill takes less than 2 seconds.  Neurological:     General: No focal deficit present.     Mental Status: He is alert and oriented to person, place, and time.  Psychiatric:        Mood and Affect: Mood normal.        Behavior: Behavior normal.        Thought Content: Thought content normal.        Judgment: Judgment normal.       Assessment/Plan: 88 yo Male with history of cholangitis due to choledocholithiasis.  Persistent gallstones. Normal patient hemodynamically cholecystectomy during previous hospitalization but due to sepsis and advanced age decision was made to postpone the surgery until his strength was better.  Today he comes in and is back to his baseline and actually has regained more of his functional status.  I had an extensive discussion with with the patient and the daughter regarding his disease process.  The role of cholecystectomy.  I do think that we do have a short window before he can get any other potential complications.  Options of doing nothing versus cholecystectomy were explained to the patient in detail.  Each option was given as an alternative and we explained the pros and cons of each decision. He is fully aware that he does have an increased perioperative risk due to his advanced age and multiple comorbidities.  On the other hand he does have significant risk if we do not do anything and this is related to biliary disease. Patient wishes to move forward with robotic cholecystectomy. I discussed the procedure in detail.  The patient  was given Neurosurgeon.  We discussed the risks and benefits of a laparoscopic cholecystectomy and possible cholangiogram including, but not limited to bleeding, infection, injury to surrounding structures such as the intestine or liver, bile leak, retained gallstones, need to convert to an open procedure, prolonged diarrhea, blood clots such as  DVT, common bile duct injury, anesthesia risks, and possible need for additional procedures.  The likelihood of improvement in symptoms and return to the patient's normal status is good. We discussed the typical post-operative recovery course.  I spent more than  45 minutes this encounter including personally reviewing imaging studies, coordinating his care, placing orders, counseling the patient and family extensively and performing appropriate documentation  Caroleen Hamman, MD [place] Surgeon

## 2022-05-28 ENCOUNTER — Telehealth: Payer: Self-pay | Admitting: Surgery

## 2022-05-28 NOTE — Telephone Encounter (Signed)
Daughter, Roselyn Reef calls back. She is informed of all dates regarding her father's surgery and verbalized understanding.

## 2022-05-28 NOTE — Telephone Encounter (Signed)
Left message with daughter, Roselyn Reef to call.  Please inform of the following regarding scheduled surgery.   Pre-Admission date/time, and Surgery date at [facility].  Surgery Date: 06/06/22 Preadmission Testing Date: 05/31/22 (phone 1p-5p)  Also will need to call at [phone], between 1-3:00pm the day before surgery, to find out what time to arrive for surgery.

## 2022-05-31 ENCOUNTER — Encounter
Admission: RE | Admit: 2022-05-31 | Discharge: 2022-05-31 | Disposition: A | Discharge status: Home / Self Care | Payer: MEDICARE | Source: Ambulatory Visit | Attending: Surgery | Admitting: Surgery

## 2022-05-31 ENCOUNTER — Other Ambulatory Visit: Payer: Self-pay

## 2022-05-31 VITALS — Ht 67.0 in | Wt 214.0 lb

## 2022-05-31 DIAGNOSIS — Z01812 Encounter for preprocedural laboratory examination: Secondary | ICD-10-CM

## 2022-05-31 DIAGNOSIS — E119 Type 2 diabetes mellitus without complications: Secondary | ICD-10-CM

## 2022-05-31 NOTE — Patient Instructions (Addendum)
Your procedure is scheduled on: June 06, 2022 THURSDAY Report to the Registration Desk on the 1st floor of the [facility]. To find out your arrival time, please call [phone] between 1PM - 3PM on: June 05, 2022 [facility] If your arrival time is 6:00 am, do not arrive prior to that time as the [place] entrance doors do not open until 6:00 am.  REMEMBER: Instructions that are not followed completely may result in serious medical risk, up to and including death; or upon the discretion of your surgeon and anesthesiologist your surgery may need to be rescheduled.  Do not eat food after midnight the night before surgery.  No gum chewing, lozengers or hard candies.  You may however, drink CLEAR liquids up to 2 hours before you are scheduled to arrive for your surgery. Do not drink anything within 2 hours of your scheduled arrival time.  Clear liquids include: - water  -BLACK COFFEE  WITH NO CREAMER OR MILK.  Do NOT drink anything that is not on this list.  In addition, your doctor has ordered for you to drink the provided  Gatorade G2 Drinking this carbohydrate drink up to two hours before surgery helps to reduce insulin resistance and improve patient outcomes. Please complete drinking 2 hours prior to scheduled arrival time.  TAKE THE MORNING OF SURGERY WITH A SIP OF WATER BYSTOLIC DIGOXIN DILTIAZEM LEVOTHYROXINE  CONTINUE ASPIRIN BUT DO NOT TAKE THE DAY OF SURGERY  One week prior to surgery: Stop Anti-inflammatories (NSAIDS) such as Advil, Aleve, Ibuprofen, Motrin, Naproxen, Naprosyn and Aspirin based products such as Excedrin, Goodys Powder, BC Powder. Stop ANY OVER THE COUNTER supplements until after surgery. You may however, continue to take Tylenol if needed for pain up until the day of surgery.  No Alcohol for 24 hours before or after surgery.  No Smoking including e-cigarettes for 24 hours prior to surgery.  No chewable tobacco products for at least 6  hours prior to surgery.  No nicotine patches on the day of surgery.  Do not use any "recreational" drugs for at least a week prior to your surgery.  Please be advised that the combination of cocaine and anesthesia may have negative outcomes, up to and including death. If you test positive for cocaine, your surgery will be cancelled.  On the morning of surgery brush your teeth with toothpaste and water, you may rinse your mouth with mouthwash if you wish. Do not swallow any toothpaste or mouthwash.  Use CHG Soap as directed on instruction sheet.  Do not wear jewelry, make-up, hairpins, clips or nail polish.  Do not wear lotions, powders, or perfumes,DEODORANT OR COLOGNE.   Do not shave body from the neck down 48 hours prior to surgery just in case you cut yourself which could leave a site for infection.  Also, freshly shaved skin may become irritated if using the CHG soap.  Contact lenses, hearing aids and dentures may not be worn into surgery.  Do not bring valuables to the hospital. [facility] is not responsible for any missing/lost belongings or valuables.   Notify your doctor if there is any change in your medical condition (cold, fever, infection).  Wear comfortable clothing (specific to your surgery type) to the hospital.  After surgery, you can help prevent lung complications by doing breathing exercises.  Take deep breaths and cough every 1-2 hours. Your doctor may order a device called an Incentive Spirometer to help you take deep breaths. When coughing or sneezing, hold  a pillow firmly against your incision with both hands. This is called "splinting." Doing this helps protect your incision. It also decreases belly discomfort.  If you are being discharged the day of surgery, you will not be allowed to drive home. You will need a responsible adult (18 years or older) to drive you home and stay with you that night.   If you are taking public transportation, you will need to  have a responsible adult (18 years or older) with you. Please confirm with your physician that it is acceptable to use public transportation.   Please call the [facility] [facility]. at [phone] if you have any questions about these instructions.  Surgery Visitation Policy:  Patients undergoing a surgery or procedure may have two family members or support persons with them as long as the person is not COVID-19 positive or experiencing its symptoms.

## 2022-06-03 ENCOUNTER — Encounter: Payer: Self-pay | Admitting: Surgery

## 2022-06-03 NOTE — Consult Note (Signed)
Perioperative Services  Pre-Admission/Anesthesia Testing Clinical Consult  Date: 06/05/22  Patient Demographics:  Name: Samuel Mack DOB:   1930-08-14 MRN:   [id number]  Planned Surgical Procedure(s):    Case: [id number] Date/Time: 06/06/22 0715   Procedures:      XI ROBOTIC ASSISTED LAPAROSCOPIC CHOLECYSTECTOMY     INDOCYANINE GREEN FLUORESCENCE IMAGING (ICG)   Anesthesia type: General   Pre-op diagnosis: Cholelithiasis   Location: [facility] OR ROOM 06 / [place] ORS FOR ANESTHESIA GROUP   Surgeons: Jules Husbands, MD   NOTE: Available PAT nursing documentation and vital signs have been reviewed. Clinical nursing staff has updated patient's PMH/PSHx, current medication list, and drug allergies/intolerances to ensure comprehensive history available to assist in medical decision making as it pertains to the aforementioned surgical procedure and anticipated anesthetic course. Extensive review of available clinical information performed. [facility] PM[facility] and PSHx updated with any diagnoses/procedures that  may have been inadvertently omitted during his intake with the pre-admission testing department's nursing staff.  Clinical Discussion:  Samuel Mack is a 88 y.o. male who is submitted for pre-surgical anesthesia review and clearance prior to him undergoing the above procedure. Patient is a Former Smoker (quit 01/1979). Pertinent PMH includes: CAD, MI, atrial fibrillation, sick sinus syndrome (s/p PPM placement), complete heart block (PPM in place), tachybradycardia syndrome, aortic atherosclerosis, HTN, HLD, T2DM, hypothyroidism, SOB, COPD, asthma, CKD-III, imbalance, decreased vision LEFT eye, deafness LEFT ear, cognitive impairment.  Patient recently admitted here at [facility] for ascending cholangitis secondary to choledocholithiasis requiring admission for hydration, antibiotics, and ERCP.  Patient met with general surgery while an inpatient and was deemed to be  an appropriate for surgery due to weakness, mentation, and overall clinical condition.  Patient recovered well during hospital admission and was discharged home in stable condition with plans for outpatient follow-up with general surgery regarding elective cholecystectomy.  Patient met with Dr. Caroleen Hamman, MD on 05/27/2022; notes reviewed.  Risk versus benefits of procedure were discussed at length with patient and his daughter, both of whom acknowledged and accepted the associated risks of surgery.  The decision has been made to proceed with surgical removal of the patient's gallbladder.  Patient is followed by cardiology Lavera Guise, MD). He was last seen in the cardiology clinic on 04/22/2022; notes reviewed. At the time of his clinic visit, the patient denied any chest pain, shortness of breath, PND, orthopnea, palpitations, significant peripheral edema, vertiginous symptoms, or presyncope/syncope.  Patient with a PMH significant for cardiovascular diagnoses.  Of note, there was limited information regarding patient's cardiovascular history available for review at time of consult.  Information gathered from patient and notes from local cardiologist.  Patient reported to have experienced a remote MI.  He is unable to advise as to the circumstances surrounding the cardiovascular event.  Patient does not recall the date of the event, nor does see recall any interventions being implemented.  Patient states "my doctor told me that I had a heart attack".  Patient developed symptomatic bradycardia in March 2001.  Given the degree of his sinoatrial node dysfunction, the decision was made to place a permanent pacemaker.  Medtronic device was placed on 12/06/1999.  Patient reported to have experienced failure of his implanted cardiac device.  Rhythm transition to complete heart block.  Pulse generator was changed on 05/07/2006.  Pacemaker pulse generator was changed out on 02/08/2015.  Pacemaker was replaced with  a beta Medtronic device on 05/12/2019.  He undergoes regular interrogations  with his primary cardiology team.  Device was last interrogated and noted to be functioning normally on 04/22/2022.  Most recent TTE was performed on 04/18/2022 revealing a normal left ventricular systolic function with an EF of 55 to 60%.  There were no regional wall motion abnormalities.  Left atrium mildly dilated.  Right ventricle was normal in size with mildly elevated systolic pressure of 01.7 mmHg.  There was mild to moderate mitral valve, moderate tricuspid, and mild aortic valve regurgitation noted.  Aortic valve was sclerotic.  There was no evidence of a significant transvalvular gradient to suggest valvular stenosis.  Patient with an atrial fibrillation diagnosis; CHA2DS2-VASc Score = 5 (age x 2, HTN, previous MI, T2DM). His rate and rhythm are currently being maintained on oral diltiazem + nebivolol + digoxin.  Patient is not chronically anticoagulated. Blood pressure well controlled at 122/65 on currently prescribed CCB (diltiazem), ARB (losartan), and beta-blocker (nebivolol) therapies. He is on a atorvastatin + omega-3 fatty acid for his HLD diagnosis and further ASCVD prevention.  T2DM reportedly well controlled with diet/lifestyle modification alone; no recent HgbA1c for review.  Functional capacity limited by age and multiple medical comorbidities.  Patient questionably able to achieve 4 METS of activity without experiencing angina/anginal equivalent symptoms.  No changes were made to his medication regimen.  Patient follow-up with outpatient cardiology in 3 months or sooner if needed.  Blair Promise is scheduled for an XI ROBOTIC ASSISTED LAPAROSCOPIC CHOLECYSTECTOMY on 06/06/2022 with Dr. Caroleen Hamman, MD. Given patient's past medical history significant for cardiovascular diagnoses and multiple medical comorbidities, presurgical clearances have been sought from patient's PCP and primary cardiology team.  Specialty  clearances were obtained as follows:  Per PCP Rosario Jacks, MD), "patient optimized and cleared to proceed with surgery at an overall INTERMEDIATE risk of experiencing complications".  Per cardiology Lavera Guise, MD), "this patient is optimized for surgery and may proceed with the planned procedural course with a MODERATE risk of significant perioperative cardiovascular complications".  Patient reports previous perioperative complications with anesthesia in the past. He has experienced (+) anesthesia awareness in the past while under general anesthesia. In review of the available records, it is noted that patient underwent a general anesthetic course here at [facility] ([facility] in 04/2022 without documented complications.      05/31/2022   11:59 AM 05/27/2022    2:27 PM 04/20/2022   11:31 AM  Vitals with BMI  Height $Remov'5\' 7"'SOSpvo$  5' 7.5"   Weight 214 lbs 211 lbs 6 oz   BMI 79.39 03.0   Systolic  092 330  Diastolic  74 82  Pulse  99 71    Providers/Specialists:   NOTE: Primary physician provider listed below. Patient may have been seen by APP or partner within same practice.   PROVIDER ROLE / SPECIALTY LAST Suszanne Finch, MD General Surgery (Surgeon) 05/27/2022  Casilda Carls, MD Primary Care Provider 06/04/2022  Cletis Athens, MD Cardiology 04/22/2022  Jennings Books, MD Neurology 08/14/2020   Allergies:  Bee venom and Penicillins  Current Home Medications:   No current facility-administered medications for this encounter.    acetaminophen (TYLENOL 8 HOUR) 650 MG CR tablet   aspirin 81 MG tablet   atorvastatin (LIPITOR) 10 MG tablet   cholecalciferol (VITAMIN D) 25 MCG (1000 UNIT) tablet   Coenzyme Q10 100 MG TABS   digoxin (LANOXIN) 0.125 MG tablet   diltiazem (DILACOR XR) 180 MG 24 hr capsule   levocetirizine (XYZAL) 5  MG tablet   levothyroxine (SYNTHROID) 175 MCG tablet   losartan (COZAAR) 100 MG tablet   Multiple Vitamins-Minerals (PRESERVISION  AREDS PO)   nebivolol (BYSTOLIC) 10 MG tablet   Omega-3 Fatty Acids (FISH OIL) 1200 MG CAPS   potassium gluconate 595 (99 K) MG TABS tablet   vitamin B-12 (CYANOCOBALAMIN) 1000 MCG tablet   vitamin C (ASCORBIC ACID) 500 MG tablet   vitamin E 180 MG (400 UNITS) capsule   History:   Past Medical History:  Diagnosis Date   Aortic atherosclerosis (HCC)    Asthma    Atrial fibrillation (HCC)    a.) CHA2DS2VASc = 5 (age x2, HTN, previous MI, T2DM);  b.) rate/rhythm maintained on oral diltiazem + nebivolol; no chronic anticoagulation   CHB (complete heart block) (HCC)    a.) PPM in place   Cholelithiasis    CKD (chronic kidney disease), stage III (HCC)    Cognitive impairment    Complication of anesthesia    a.) anesthesia awareness with previous PPM insertion   COPD (chronic obstructive pulmonary disease) (HCC)    Deafness in left ear    Decreased vision of left eye    Diet-controlled type 2 diabetes mellitus (HCC)    Dyspnea    Essential tremor    Hepatic steatosis    HLD (hyperlipidemia)    Hypertension    Hypothyroidism    Imbalance    Myocardial infarction ([place])    Presence of permanent cardiac pacemaker 12/06/1999   a.) Medtronic device placed for SSS; b.) device failure - developed CHB --> device replaced 05/07/2006; c.) pulse generator changed 02/08/2015; d.) pulse generator changed 05/12/2019   Skin cancer    SSS (sick sinus syndrome) (HCC)    a.) s/p PPM placement   Tachy-brady syndrome [facility])    Past Surgical History:  Procedure Laterality Date   CATARACT EXTRACTION Left    CATARACT EXTRACTION W/PHACO Left 12/11/2016   Procedure: CATARACT EXTRACTION PHACO AND INTRAOCULAR LENS PLACEMENT (IOC);  Surgeon: Leandrew Koyanagi, MD;  Location: [place];  Service: Ophthalmology;  Laterality: Left;  IVA TOPICAL LEFT   ERCP N/A 04/19/2022   Procedure: ENDOSCOPIC RETROGRADE CHOLANGIOPANCREATOGRAPHY (ERCP);  Surgeon: Lucilla Lame, MD;  Location[facility] END[facility]COPY;   Service: Endoscopy;  Laterality: N/A;   NASAL SINUS SURGERY     PACEMAKER GENERATOR CHANGE  05/07/2006   Procedure: PACEMAKER GENERATOR CHANGE; Location: [place]; Surgeon: Cletis Athens, MD   PACEMAKER GENERATOR CHANGE  02/08/2015   Procedure: PACEMAKER GENERATOR CHANGE; Location: [place]; Surgeon: Cletis Athens, MD   PACEMAKER INSERTION  12/06/1999   Procedure: PACEMAKER INSERTION; Location: [place]; Surgeon: Cletis Athens, MD   PACEMAKER INSERTION Left 05/12/2019   Procedure: INSERTION PACEMAKER, LEFT;  Surgeon: Cletis Athens, MD;  Location: [facility] ORS;  Service: Cardiovascular;  Laterality: Left;   PROSTATE SURGERY     TRANSURETHRAL RESECTION OF PROSTATE N/A 06/20/2020   Procedure: TRANSURETHRAL RESECTION OF THE PROSTATE (TURP);  Surgeon: [name], MD;  Location: [facility] ORS;  Service: Urology;  Laterality: N/A;   No family history on file. Social History   Tobacco Use   Smoking status: Former    Types: Cigarettes    Quit date: 01/26/1979    Years since quitting: 43.3   Smokeless tobacco: Never  Vaping Use   Vaping Use: Never used  Substance Use Topics   Alcohol use: No   Drug use: No    Pertinent Clinical Results:  LABS: Labs reviewed: Acceptable for surgery.  Component Date Value Ref Range Status  Troponin I (High Sensitivity) 04/16/2022 11  <18 ng/L Final   Comment: (NOTE) Elevated high sensitivity troponin I (hsTnI) values and significant  changes across serial measurements may suggest ACS but many other  chronic and acute conditions are known to elevate hsTnI results.  Refer to the "Links" section for chest pain algorithms and additional  guidance. Performed at [facility], [place], [place] [address]    Color, Urine 04/17/2022 AMBER (A)  YELLOW Final   BIOCHEMICALS MAY BE AFFECTED BY COLOR   APPearance 04/17/2022 CLEAR (A)  CLEAR Final   Specific Gravity, Urine 04/17/2022 >1.046 (H)  1.005 - 1.030 Final   pH 04/17/2022 5.0  5.0 - 8.0 Final    Glucose, UA 04/17/2022 NEGATIVE  NEGATIVE mg/dL Final   Hgb urine dipstick 04/17/2022 NEGATIVE  NEGATIVE Final   Bilirubin Urine 04/17/2022 NEGATIVE  NEGATIVE Final   Ketones, ur 04/17/2022 5 (A)  NEGATIVE mg/dL Final   Protein, ur 04/17/2022 NEGATIVE  NEGATIVE mg/dL Final   Nitrite 04/17/2022 NEGATIVE  NEGATIVE Final   Leukocytes,Ua 04/17/2022 NEGATIVE  NEGATIVE Final   Performed at [facility], [place], [place] [address]   B Natriuretic Peptide 04/16/2022 175.7 (H)  0.0 - 100.0 pg/mL Final   Performed at [facility], [place], [place] [address]   Troponin I (High Sensitivity) 04/16/2022 13  <18 ng/L Final   Comment: (NOTE) Elevated high sensitivity troponin I (hsTnI) values and significant  changes across serial measurements may suggest ACS but many other  chronic and acute conditions are known to elevate hsTnI results.  Refer to the "Links" section for chest pain algorithms and additional  guidance. Performed at [facility], [address] [place], [place] [address]    Prothrombin Time 04/16/2022 15.5 (H)  11.4 - 15.2 seconds Final   INR 04/16/2022 1.2  0.8 - 1.2 Final   Comment: (NOTE) INR goal varies based on device and disease states. Performed at [facility], [place], [place] [address]    Sodium 04/20/2022 131 (L)  135 - 145 mmol/L Final   Potassium 04/20/2022 4.4  3.5 - 5.1 mmol/L Final   Chloride 04/20/2022 103  98 - 111 mmol/L Final   CO2 04/20/2022 19 (L)  22 - 32 mmol/L Final   Glucose, Bld 04/20/2022 85  70 - 99 mg/dL Final   Glucose reference range applies only to samples taken after fasting for at least 8 hours.   BUN 04/20/2022 28 (H)  8 - 23 mg/dL Final   Creatinine, Ser 04/20/2022 1.52 (H)  0.61 - 1.24 mg/dL Final   Calcium 04/20/2022 8.7 (L)  8.9 - 10.3 mg/dL Final   Total Protein 04/20/2022 6.4 (L)  6.5 - 8.1 g/dL Final   Albumin 04/20/2022 3.2 (L)  3.5 - 5.0 g/dL Final    AST 04/20/2022 47 (H)  15 - 41 U/L Final   ALT 04/20/2022 127 (H)  0 - 44 U/L Final   Alkaline Phosphatase 04/20/2022 205 (H)  38 - 126 U/L Final   Total Bilirubin 04/20/2022 1.7 (H)  0.3 - 1.2 mg/dL Final   GFR, Estimated 04/20/2022 43 (L)  >60 mL/min Final   Comment: (NOTE) Calculated using the CKD-EPI Creatinine Equation (2021)    Anion gap 04/20/2022 9  5 - 15 Final   Performed at [facility], [place],  [address]   WBC 04/20/2022 11.1 (H)  4.0 - 10.5  K/uL Final   RBC 04/20/2022 3.59 (L)  4.22 - 5.81 MIL/uL Final   Hemoglobin 04/20/2022 10.5 (L)  13.0 - 17.0 g/dL Final   HCT 04/20/2022 32.1 (L)  39.0 - 52.0 % Final   MCV 04/20/2022 89.4  80.0 - 100.0 fL Final   MCH 04/20/2022 29.2  26.0 - 34.0 pg Final   MCHC 04/20/2022 32.7  30.0 - 36.0 g/dL Final   RDW 04/20/2022 14.4  11.5 - 15.5 % Final   Platelets 04/20/2022 175  150 - 400 K/uL Final   nRBC 04/20/2022 0.0  0.0 - 0.2 % Final   Performed at [facility], [address] [place], [place] [address]   Lipase 04/20/2022 28  11 - 51 U/L Final   Performed at [facility], [address] [place], [place] [address]    ECG: Date: 04/21/2022 Time ECG obtained: 2241 PM Rate: 70 bpm Rhythm:  Ventricular paced rhythm Axis (leads I and aVF): Normal Intervals: QRS 161 ms. QTc 430 ms. ST segment and T wave changes: No evidence of acute ST segment elevation or depression Comparison: Similar to previous tracing obtained on 06/14/2020   IMAGING / PROCEDURES: TRANSTHORACIC ECHOCARDIOGRAM performed on 04/18/2022 Normal left ventricular systolic function with an EF of 55-60% Normal left ventricular diastolic Doppler parameters Left atrium mildly dilated Normal right ventricular systolic function Mildly elevated RVSP of 44.6 mmHg Moderate TR Mild to moderate MR Mild AR Aortic valve sclerosis Normal transvalvular gradients; no valvular stenosis No pericardial effusion  NM  HEPATO W/EF performed on 04/17/2022 Findings most consistent with distal common bile duct obstruction. Potential obstructing lesion identified on comparison CT. Consider MRCP for further evaluation versus ERCP for clearance of obstruction. Filling of the gallbladder cannot be adequately assessed on the background of common bile duct obstruction.  CT ABDOMEN PELVIS W CONTRAST performed on 04/16/2022 No bowel obstruction. Normal appendix. Noncalcified structure within the gallbladder as seen on the ultrasound. Mild biliary ductal dilatation. The presence of noncalcified stone in the central CBD is not excluded. Evaluation is very limited due to respiratory motion. MRCP may provide better evaluation if clinically indicated. Fatty liver. Aortic atherosclerosis  US ABDOMEN LIMITED RUQ (LIVER/GB) performed on 04/16/2022 1.2 cm echogenic focus which is non mobile near the gallbladder neck, May represent a sludge ball, although polyp would deserve consideration as well. No cholelithiasis noted.  CBD 4.6 mm Follow-up can be performed as clinically indicated given the patient's advanced age.  DIAGNOSTIC RADIOGRAPHS OF CHEST PORTABLE 1 VIEW performed on 04/16/2022 Low lung volumes with bibasilar opacities No definite confluent airspace disease Heart and mediastinal contours are within normal limits Aortic atherosclerosis Stable LEFT subclavian transvenous pacemaker No pleural effusion Visualized bones are unremarkable  Impression and Plan:  Samuel Mack has been referred for pre-anesthesia review and clearance prior to him undergoing the planned anesthetic and procedural courses. Available labs, pertinent testing, and imaging results were personally reviewed by me. This patient has been appropriately cleared by internal medicine (INTERMEDIATE) and by cardiology (MODERATE) with the individually indicated risks of patient experiencing significant perioperative complications. I did not receive a  completed pacemaker form, however I was sent his interrogation report. His pacemaker was interrogated and found to be functioning normally.   Based on clinical review performed today (06/05/22), barring any significant acute changes in the patient's overall condition, it is anticipated that he will be able to proceed with the planned surgical intervention. Any acute changes in clinical condition may necessitate his procedure being  postponed and/or cancelled. Patient will meet with anesthesia team (MD and/or CRNA) on the day of his procedure for preoperative evaluation/assessment. Questions regarding anesthetic course will be fielded at that time.   Pre-surgical instructions were reviewed with the patient during his PAT appointment and questions were fielded by PAT clinical staff. Patient was advised that if any questions or concerns arise prior to his procedure then he should return a call to PAT and/or his surgeon's office to discuss.  Honor Loh, MSN, APRN, FNP-C, CEN [facility]  Peri-operative Services Nurse Practitioner Phone: [phone] Fax: [phone] 06/05/22 5:06 PM  NOTE: This note has been prepared using Dragon dictation software. Despite my best ability to proofread, there is always the potential that unintentional transcriptional errors may still occur from this process.

## 2022-06-05 ENCOUNTER — Telehealth: Payer: Self-pay

## 2022-06-05 NOTE — Telephone Encounter (Signed)
Medical clearance received from [facility] -medium risk-noted on form that Dr.Jadali has spoke wit Dr.Pabon.

## 2022-06-06 ENCOUNTER — Ambulatory Visit
Admission: RE | Admit: 2022-06-06 | Discharge: 2022-06-06 | Disposition: A | Discharge status: Home / Self Care | Payer: MEDICARE | Attending: Surgery | Admitting: Surgery

## 2022-06-06 ENCOUNTER — Encounter
Admission: RE | Disposition: A | Discharge status: Home / Self Care | Payer: Self-pay | Source: Home / Self Care | Attending: Surgery

## 2022-06-06 ENCOUNTER — Other Ambulatory Visit: Payer: Self-pay

## 2022-06-06 ENCOUNTER — Ambulatory Visit: Payer: MEDICARE | Admitting: Urgent Care

## 2022-06-06 ENCOUNTER — Encounter: Payer: Self-pay | Admitting: Surgery

## 2022-06-06 DIAGNOSIS — J449 Chronic obstructive pulmonary disease, unspecified: Secondary | ICD-10-CM | POA: Insufficient documentation

## 2022-06-06 DIAGNOSIS — K8020 Calculus of gallbladder without cholecystitis without obstruction: Secondary | ICD-10-CM | POA: Diagnosis present

## 2022-06-06 DIAGNOSIS — I129 Hypertensive chronic kidney disease with stage 1 through stage 4 chronic kidney disease, or unspecified chronic kidney disease: Secondary | ICD-10-CM | POA: Insufficient documentation

## 2022-06-06 DIAGNOSIS — K8044 Calculus of bile duct with chronic cholecystitis without obstruction: Secondary | ICD-10-CM | POA: Diagnosis not present

## 2022-06-06 DIAGNOSIS — E119 Type 2 diabetes mellitus without complications: Secondary | ICD-10-CM

## 2022-06-06 DIAGNOSIS — I252 Old myocardial infarction: Secondary | ICD-10-CM | POA: Insufficient documentation

## 2022-06-06 DIAGNOSIS — N183 Chronic kidney disease, stage 3 unspecified: Secondary | ICD-10-CM | POA: Insufficient documentation

## 2022-06-06 DIAGNOSIS — Z950 Presence of cardiac pacemaker: Secondary | ICD-10-CM | POA: Diagnosis not present

## 2022-06-06 DIAGNOSIS — E1122 Type 2 diabetes mellitus with diabetic chronic kidney disease: Secondary | ICD-10-CM | POA: Insufficient documentation

## 2022-06-06 DIAGNOSIS — K8010 Calculus of gallbladder with chronic cholecystitis without obstruction: Secondary | ICD-10-CM | POA: Diagnosis not present

## 2022-06-06 DIAGNOSIS — K660 Peritoneal adhesions (postprocedural) (postinfection): Secondary | ICD-10-CM | POA: Insufficient documentation

## 2022-06-06 DIAGNOSIS — Z01812 Encounter for preprocedural laboratory examination: Secondary | ICD-10-CM

## 2022-06-06 DIAGNOSIS — Z87891 Personal history of nicotine dependence: Secondary | ICD-10-CM | POA: Insufficient documentation

## 2022-06-06 DIAGNOSIS — I4891 Unspecified atrial fibrillation: Secondary | ICD-10-CM | POA: Diagnosis not present

## 2022-06-06 HISTORY — DX: Atrioventricular block, complete: I44.2

## 2022-06-06 HISTORY — DX: Unspecified hearing loss, left ear: H91.92

## 2022-06-06 HISTORY — DX: Other symptoms and signs involving cognitive functions and awareness: R41.89

## 2022-06-06 HISTORY — DX: Type 2 diabetes mellitus without complications: E11.9

## 2022-06-06 HISTORY — DX: Essential tremor: G25.0

## 2022-06-06 HISTORY — DX: Chronic obstructive pulmonary disease, unspecified: J44.9

## 2022-06-06 HISTORY — DX: Sick sinus syndrome: I49.5

## 2022-06-06 HISTORY — DX: Chronic kidney disease, stage 3 unspecified: N18.30

## 2022-06-06 HISTORY — DX: Unspecified malignant neoplasm of skin, unspecified: C44.90

## 2022-06-06 HISTORY — DX: Atherosclerosis of aorta: I70.0

## 2022-06-06 HISTORY — DX: Unspecified atrial fibrillation: I48.91

## 2022-06-06 HISTORY — DX: Unqualified visual loss, left eye, normal vision right eye: H54.62

## 2022-06-06 HISTORY — DX: Fatty (change of) liver, not elsewhere classified: K76.0

## 2022-06-06 HISTORY — DX: Hyperlipidemia, unspecified: E78.5

## 2022-06-06 HISTORY — DX: Other abnormalities of gait and mobility: R26.89

## 2022-06-06 HISTORY — DX: Calculus of gallbladder without cholecystitis without obstruction: K80.20

## 2022-06-06 LAB — GLUCOSE, CAPILLARY
Glucose-Capillary: 97 mg/dL (ref 70–99)
Glucose-Capillary: 124 mg/dL — ABNORMAL HIGH (ref 70–99)

## 2022-06-06 SURGERY — CHOLECYSTECTOMY, ROBOT-ASSISTED, LAPAROSCOPIC
Anesthesia: General | Site: Abdomen

## 2022-06-06 MED ORDER — EPHEDRINE SULFATE (PRESSORS) 50 MG/ML IJ SOLN
INTRAMUSCULAR | Status: DC | PRN
  Administered 2022-06-06: 10 mg via INTRAVENOUS

## 2022-06-06 MED ORDER — CEFAZOLIN SODIUM-DEXTROSE 2-4 GM/100ML-% IV SOLN
2.0000 g | INTRAVENOUS | Status: AC
  Administered 2022-06-06: 2 g via INTRAVENOUS

## 2022-06-06 MED ORDER — CELECOXIB 200 MG PO CAPS
ORAL_CAPSULE | ORAL | Status: AC
  Administered 2022-06-06: 200 mg via ORAL
  Filled 2022-06-06: qty 1

## 2022-06-06 MED ORDER — ONDANSETRON HCL 4 MG/2ML IJ SOLN
INTRAMUSCULAR | Status: DC | PRN
  Administered 2022-06-06: 4 mg via INTRAVENOUS

## 2022-06-06 MED ORDER — CEFAZOLIN SODIUM-DEXTROSE 2-4 GM/100ML-% IV SOLN
INTRAVENOUS | Status: AC
  Filled 2022-06-06: qty 100

## 2022-06-06 MED ORDER — CHLORHEXIDINE GLUCONATE CLOTH 2 % EX PADS
6.0000 | MEDICATED_PAD | Freq: Once | CUTANEOUS | Status: AC
  Administered 2022-06-06: 6 via TOPICAL

## 2022-06-06 MED ORDER — BUPIVACAINE-EPINEPHRINE (PF) 0.25% -1:200000 IJ SOLN
INTRAMUSCULAR | Status: DC | PRN
  Administered 2022-06-06: 50 mL

## 2022-06-06 MED ORDER — FENTANYL CITRATE (PF) 100 MCG/2ML IJ SOLN
INTRAMUSCULAR | Status: AC
  Filled 2022-06-06: qty 2

## 2022-06-06 MED ORDER — ROCURONIUM BROMIDE 100 MG/10ML IV SOLN
INTRAVENOUS | Status: DC | PRN
  Administered 2022-06-06: 50 mg via INTRAVENOUS

## 2022-06-06 MED ORDER — CHLORHEXIDINE GLUCONATE 0.12 % MT SOLN
OROMUCOSAL | Status: AC
  Administered 2022-06-06: 15 mL via OROMUCOSAL
  Filled 2022-06-06: qty 15

## 2022-06-06 MED ORDER — LIDOCAINE HCL (CARDIAC) PF 100 MG/5ML IV SOSY
PREFILLED_SYRINGE | INTRAVENOUS | Status: DC | PRN
  Administered 2022-06-06: 100 mg via INTRAVENOUS

## 2022-06-06 MED ORDER — PROPOFOL 10 MG/ML IV BOLUS
INTRAVENOUS | Status: AC
  Filled 2022-06-06: qty 20

## 2022-06-06 MED ORDER — SUGAMMADEX SODIUM 200 MG/2ML IV SOLN
INTRAVENOUS | Status: DC | PRN
  Administered 2022-06-06: 200 mg via INTRAVENOUS

## 2022-06-06 MED ORDER — EPHEDRINE 5 MG/ML INJ
INTRAVENOUS | Status: AC
  Filled 2022-06-06: qty 5

## 2022-06-06 MED ORDER — BUPIVACAINE LIPOSOME 1.3 % IJ SUSP
INTRAMUSCULAR | Status: AC
  Filled 2022-06-06: qty 20

## 2022-06-06 MED ORDER — ONDANSETRON HCL 4 MG/2ML IJ SOLN: 4.0000 mg | Freq: Once | INTRAMUSCULAR | Status: DC | PRN

## 2022-06-06 MED ORDER — CHLORHEXIDINE GLUCONATE 0.12 % MT SOLN: 15.0000 mL | Freq: Once | OROMUCOSAL | Status: AC

## 2022-06-06 MED ORDER — LIDOCAINE HCL (PF) 2 % IJ SOLN
INTRAMUSCULAR | Status: AC
  Filled 2022-06-06: qty 5

## 2022-06-06 MED ORDER — PHENYLEPHRINE HCL (PRESSORS) 10 MG/ML IV SOLN
INTRAVENOUS | Status: DC | PRN
  Administered 2022-06-06 (×2): 80 ug via INTRAVENOUS

## 2022-06-06 MED ORDER — ORAL CARE MOUTH RINSE: 15.0000 mL | Freq: Once | OROMUCOSAL | Status: AC

## 2022-06-06 MED ORDER — 0.9 % SODIUM CHLORIDE (POUR BTL) OPTIME
TOPICAL | Status: DC | PRN
  Administered 2022-06-06: 500 mL

## 2022-06-06 MED ORDER — DEXAMETHASONE SODIUM PHOSPHATE 10 MG/ML IJ SOLN
INTRAMUSCULAR | Status: DC | PRN
  Administered 2022-06-06: 5 mg via INTRAVENOUS

## 2022-06-06 MED ORDER — SODIUM CHLORIDE 0.9 % IV SOLN: INTRAVENOUS | Status: DC

## 2022-06-06 MED ORDER — INDOCYANINE GREEN 25 MG IV SOLR
2.5000 mg | Freq: Once | INTRAVENOUS | Status: AC
  Administered 2022-06-06: 2.5 mg via INTRAVENOUS
  Filled 2022-06-06: qty 1

## 2022-06-06 MED ORDER — FAMOTIDINE 20 MG PO TABS: 20.0000 mg | ORAL_TABLET | Freq: Once | ORAL | Status: AC

## 2022-06-06 MED ORDER — DEXAMETHASONE SODIUM PHOSPHATE 10 MG/ML IJ SOLN
INTRAMUSCULAR | Status: AC
  Filled 2022-06-06: qty 1

## 2022-06-06 MED ORDER — GABAPENTIN 300 MG PO CAPS: 300.0000 mg | ORAL_CAPSULE | ORAL | Status: AC

## 2022-06-06 MED ORDER — CELECOXIB 200 MG PO CAPS: 200.0000 mg | ORAL_CAPSULE | ORAL | Status: AC

## 2022-06-06 MED ORDER — GABAPENTIN 300 MG PO CAPS
ORAL_CAPSULE | ORAL | Status: AC
  Administered 2022-06-06: 300 mg via ORAL
  Filled 2022-06-06: qty 1

## 2022-06-06 MED ORDER — ONDANSETRON HCL 4 MG/2ML IJ SOLN
INTRAMUSCULAR | Status: AC
  Filled 2022-06-06: qty 2

## 2022-06-06 MED ORDER — OXYCODONE HCL 5 MG PO TABS
ORAL_TABLET | ORAL | Status: AC
  Filled 2022-06-06: qty 1

## 2022-06-06 MED ORDER — ACETAMINOPHEN 500 MG PO TABS
ORAL_TABLET | ORAL | Status: AC
  Administered 2022-06-06: 1000 mg via ORAL
  Filled 2022-06-06: qty 2

## 2022-06-06 MED ORDER — FENTANYL CITRATE (PF) 100 MCG/2ML IJ SOLN
25.0000 ug | INTRAMUSCULAR | Status: DC | PRN
  Administered 2022-06-06: 25 ug via INTRAVENOUS

## 2022-06-06 MED ORDER — HYDROCODONE-ACETAMINOPHEN 5-325 MG PO TABS: 1.0000 | ORAL_TABLET | Freq: Four times a day (QID) | ORAL | 0 refills | Status: DC | PRN

## 2022-06-06 MED ORDER — OXYCODONE HCL 5 MG PO TABS
5.0000 mg | ORAL_TABLET | Freq: Once | ORAL | Status: AC | PRN
  Administered 2022-06-06: 5 mg via ORAL

## 2022-06-06 MED ORDER — FAMOTIDINE 20 MG PO TABS
ORAL_TABLET | ORAL | Status: AC
  Administered 2022-06-06: 20 mg via ORAL
  Filled 2022-06-06: qty 1

## 2022-06-06 MED ORDER — FENTANYL CITRATE (PF) 100 MCG/2ML IJ SOLN
INTRAMUSCULAR | Status: DC | PRN
  Administered 2022-06-06 (×2): 50 ug via INTRAVENOUS

## 2022-06-06 MED ORDER — BUPIVACAINE-EPINEPHRINE (PF) 0.25% -1:200000 IJ SOLN
INTRAMUSCULAR | Status: AC
  Filled 2022-06-06: qty 30

## 2022-06-06 MED ORDER — OXYCODONE HCL 5 MG/5ML PO SOLN: 5.0000 mg | Freq: Once | ORAL | Status: AC | PRN

## 2022-06-06 MED ORDER — ROCURONIUM BROMIDE 10 MG/ML (PF) SYRINGE
PREFILLED_SYRINGE | INTRAVENOUS | Status: AC
  Filled 2022-06-06: qty 10

## 2022-06-06 MED ORDER — ACETAMINOPHEN 500 MG PO TABS: 1000.0000 mg | ORAL_TABLET | ORAL | Status: AC

## 2022-06-06 MED ORDER — PROPOFOL 10 MG/ML IV BOLUS
INTRAVENOUS | Status: DC | PRN
  Administered 2022-06-06: 10 mg via INTRAVENOUS
  Administered 2022-06-06: 70 mg via INTRAVENOUS

## 2022-06-06 SURGICAL SUPPLY — 42 items
ADH SKN CLS APL DERMABOND .7 (GAUZE/BANDAGES/DRESSINGS) ×2
CANNULA REDUC XI 12-8 STAPL (CANNULA) ×2
CANNULA REDUCER 12-8 DVNC XI (CANNULA) ×2 IMPLANT
CLIP LIGATING HEMO O LOK GREEN (MISCELLANEOUS) ×2 IMPLANT
DERMABOND ADVANCED .7 DNX12 (GAUZE/BANDAGES/DRESSINGS) ×2 IMPLANT
DRAPE ARM DVNC X/XI (DISPOSABLE) ×8 IMPLANT
DRAPE COLUMN DVNC XI (DISPOSABLE) ×2 IMPLANT
DRAPE DA VINCI XI ARM (DISPOSABLE) ×8
DRAPE DA VINCI XI COLUMN (DISPOSABLE) ×2
ELECT CAUTERY BLADE 6.4 (BLADE) ×2 IMPLANT
ELECT REM PT RETURN 9FT ADLT (ELECTROSURGICAL) ×2
ELECTRODE REM PT RTRN 9FT ADLT (ELECTROSURGICAL) ×2 IMPLANT
GLOVE BIO SURGEON STRL SZ7 (GLOVE) ×4 IMPLANT
GOWN STRL REUS W/ TWL LRG LVL3 (GOWN DISPOSABLE) ×8 IMPLANT
GOWN STRL REUS W/TWL LRG LVL3 (GOWN DISPOSABLE) ×8
KIT PINK PAD W/HEAD ARE REST (MISCELLANEOUS) ×2
KIT PINK PAD W/HEAD ARM REST (MISCELLANEOUS) ×2 IMPLANT
LABEL OR SOLS (LABEL) ×2 IMPLANT
MANIFOLD NEPTUNE II (INSTRUMENTS) ×2 IMPLANT
NEEDLE HYPO 22GX1.5 SAFETY (NEEDLE) ×2 IMPLANT
NS IRRIG 500ML POUR BTL (IV SOLUTION) ×2 IMPLANT
OBTURATOR OPTICAL STANDARD 8MM (TROCAR) ×2
OBTURATOR OPTICAL STND 8 DVNC (TROCAR) ×2
OBTURATOR OPTICALSTD 8 DVNC (TROCAR) ×2 IMPLANT
PACK LAP CHOLECYSTECTOMY (MISCELLANEOUS) ×2 IMPLANT
PENCIL SMOKE EVACUATOR (MISCELLANEOUS) ×2 IMPLANT
SEAL CANN UNIV 5-8 DVNC XI (MISCELLANEOUS) ×6 IMPLANT
SEAL XI 5MM-8MM UNIVERSAL (MISCELLANEOUS) ×6
SET TUBE SMOKE EVAC HIGH FLOW (TUBING) ×2 IMPLANT
SOLUTION ELECTROLUBE (MISCELLANEOUS) ×2 IMPLANT
SPIKE FLUID TRANSFER (MISCELLANEOUS) ×2 IMPLANT
SPONGE T-LAP 18X18 ~~LOC~~+RFID (SPONGE) ×2 IMPLANT
STAPLER CANNULA SEAL DVNC XI (STAPLE) ×2 IMPLANT
STAPLER CANNULA SEAL XI (STAPLE) ×2
SUT MNCRL AB 4-0 PS2 18 (SUTURE) ×2 IMPLANT
SUT VICRYL 0 AB UR-6 (SUTURE) ×4 IMPLANT
SYR 20ML LL LF (SYRINGE) ×2 IMPLANT
SYR 30ML LL (SYRINGE) ×2 IMPLANT
SYS BAG RETRIEVAL 10MM (BASKET) ×2
SYSTEM BAG RETRIEVAL 10MM (BASKET) ×2 IMPLANT
TRAP FLUID SMOKE EVACUATOR (MISCELLANEOUS) ×2 IMPLANT
WATER STERILE IRR 500ML POUR (IV SOLUTION) ×2 IMPLANT

## 2022-06-06 NOTE — Op Note (Signed)
Robotic assisted laparoscopic Cholecystectomy  Pre-operative Diagnosis: Hx cholangitis from choledocholithiasis  Post-operative Diagnosis: same  Procedure:  Robotic assisted laparoscopic Cholecystectomy  Surgeon: Caroleen Hamman, MD FACS  Anesthesia: Gen. with endotracheal tube  Findings: Chronic mild Cholecystitis   Estimated Blood Loss: 5cc       Specimens: Gallbladder           Complications: none   Procedure Details  The patient was seen again in the Holding Room. The benefits, complications, treatment options, and expected outcomes were discussed with the patient. The risks of bleeding, infection, recurrence of symptoms, failure to resolve symptoms, bile duct damage, bile duct leak, retained common bile duct stone, bowel injury, any of which could require further surgery and/or ERCP, stent, or papillotomy were reviewed with the patient. The likelihood of improving the patient's symptoms with return to their baseline status is good.  The patient and/or family concurred with the proposed plan, giving informed consent.  The patient was taken to Operating Room, identified  and the procedure verified as Laparoscopic Cholecystectomy.  A Time Out was held and the above information confirmed.  Prior to the induction of general anesthesia, antibiotic prophylaxis was administered. VTE prophylaxis was in place. General endotracheal anesthesia was then administered and tolerated well. After the induction, the abdomen was prepped with Chloraprep and draped in the sterile fashion. The patient was positioned in the supine position.  Cut down technique was used to enter the abdominal cavity and a Hasson trochar was placed after two vicryl stitches were anchored to the fascia. Pneumoperitoneum was then created with CO2 and tolerated well without any adverse changes in the patient's vital signs.  Three 8-mm ports were placed under direct vision. All skin incisions  were infiltrated with a local  anesthetic agent before making the incision and placing the trocars.   The patient was positioned  in reverse Trendelenburg, robot was brought to the surgical field and docked in the standard fashion.  We made sure all the instrumentation was kept indirect view at all times and that there were no collision between the arms. I scrubbed out and went to the console.  The gallbladder was identified, the fundus grasped and retracted cephalad. Adhesions were lysed bluntly. The infundibulum was grasped and retracted laterally, exposing the peritoneum overlying the triangle of Calot. This was then divided and exposed in a blunt fashion. An extended critical view of the cystic duct and cystic artery was obtained.  The cystic duct was clearly identified and bluntly dissected.   Artery and duct were double clipped and divided. Using ICG cholangiography we visualize the cystic duct and CBD, no evidence of bile injuries visualized. The gallbladder was taken from the gallbladder fossa in a retrograde fashion with the electrocautery.  Hemostasis was achieved with the electrocautery. nspection of the right upper quadrant was performed. No bleeding, bile duct injury or leak, or bowel injury was noted. Robotic instruments and robotic arms were undocked in the standard fashion.  I scrubbed back in.  The gallbladder was removed and placed in an Endocatch bag.   Pneumoperitoneum was released.  The periumbilical port site was closed with interrumpted 0 Vicryl sutures. 4-0 subcuticular Monocryl was used to close the skin. Dermabond was  applied.  The patient was then extubated and brought to the recovery room in stable condition. Sponge, lap, and needle counts were correct at closure and at the conclusion of the case.               [place]  Dahlia Byes, MD, FACS

## 2022-06-06 NOTE — Discharge Instructions (Addendum)
Laparoscopic Cholecystectomy, Care After   These instructions give you information on caring for yourself after your procedure. Your doctor may also give you more specific instructions. Call your doctor if you have any problems or questions after your procedure.  HOME CARE  Change your bandages (dressings) as told by your doctor.  Keep the wound dry and clean. Wash the wound gently with soap and water. Pat the wound dry with a clean towel.  Do not take baths, swim, or use hot tubs for 2 weeks, or as told by your doctor.  Only take medicine as told by your doctor.  Eat a normal diet as told by your doctor.  Do not lift anything heavier than 10 pounds (4.5 kg) until your doctor says it is okay.  Do not play contact sports for 1 week, or as told by your doctor. GET HELP IF:  Your wound is red, puffy (swollen), or painful.  You have yellowish-white fluid (pus) coming from the wound.  You have fluid draining from the wound for more than 1 day.  You have a bad smell coming from the wound.  Your wound breaks open. GET HELP RIGHT AWAY IF:  You have trouble breathing.  You have chest pain.  You have a fever >101  You have pain in the shoulders (shoulder strap areas) that is getting worse.  You feel dizzy or pass out (faint).  You have severe belly (abdominal) pain.  You feel sick to your stomach (nauseous) or throw up (vomit) for more than 1 day.     AMBULATORY SURGERY  DISCHARGE INSTRUCTIONS   The drugs that you were given will stay in your system until tomorrow so for the next 24 hours you should not:  Drive an automobile Make any legal decisions Drink any alcoholic beverage   You may resume regular meals tomorrow.  Today it is better to start with liquids and gradually work up to solid foods.  You may eat anything you prefer, but it is better to start with liquids, then soup and crackers, and gradually work up to solid foods.   Please notify your doctor immediately if you have  any unusual bleeding, trouble breathing, redness and pain at the surgery site, drainage, fever, or pain not relieved by medication.      Additional Instructions:

## 2022-06-06 NOTE — Anesthesia Preprocedure Evaluation (Addendum)
Anesthesia Evaluation  Patient identified by MRN, date of birth, ID band Patient awake    Reviewed: Allergy & Precautions, NPO status , Patient's Chart, lab work & pertinent test results  History of Anesthesia Complications (+) AWARENESS UNDER ANESTHESIA and history of anesthetic complications  Airway Mallampati: II  TM Distance: >3 FB Neck ROM: Full    Dental  (+) Upper Dentures, Lower Dentures   Pulmonary neg sleep apnea, COPD, Patient abstained from smoking.Not current smoker, former smoker,  Patient denies breathing issues; does not use inhalers.  Patient has a cough for the past 4 weeks, dry, attributed to nasal drainage.   Pulmonary exam normal breath sounds clear to auscultation       Cardiovascular Exercise Tolerance: Poor METShypertension, + Past MI  (-) CAD + dysrhythmias Atrial Fibrillation + pacemaker + Valvular Problems/Murmurs MR and AI  Rhythm:Regular Rate:Normal - Systolic murmurs ? Pacemaker was replaced with a beta Medtronic device on 05/12/2019.  He undergoes regular interrogations with his primary cardiology team.  Device was last interrogated and noted to be functioning normally on 04/22/2022.  ? Most recent TTE was performed on 04/18/2022 revealing a normal left ventricular systolic function with an EF of 55 to 60%.  There were no regional wall motion abnormalities.  Left atrium mildly dilated.  Right ventricle was normal in size with mildly elevated systolic pressure of 90.2 mmHg.  There was mild to moderate mitral valve, moderate tricuspid, and mild aortic valve regurgitation noted.  Aortic valve was sclerotic.  There was no evidence of a significant transvalvular gradient to suggest valvular stenosis.   Neuro/Psych negative neurological ROS  negative psych ROS   GI/Hepatic neg GERD  ,(+)     (-) substance abuse  ,   Endo/Other  diabetesHypothyroidism   Renal/GU CRFRenal disease      Musculoskeletal   Abdominal   Peds  Hematology   Anesthesia Other Findings Past Medical History: No date: Aortic atherosclerosis (HCC) No date: Asthma No date: Atrial fibrillation (HCC)     Comment:  a.) CHA2DS2VASc = 5 (age x2, HTN, previous MI, T2DM);                b.) rate/rhythm maintained on oral diltiazem + nebivolol;              no chronic anticoagulation No date: CHB (complete heart block) (HCC)     Comment:  a.) PPM in place No date: Cholelithiasis No date: CKD (chronic kidney disease), stage III (HCC) No date: Cognitive impairment No date: Complication of anesthesia     Comment:  a.) anesthesia awareness with previous PPM insertion No date: COPD (chronic obstructive pulmonary disease) (HCC) No date: Deafness in left ear No date: Decreased vision of left eye No date: Diet-controlled type 2 diabetes mellitus (HCC) No date: Dyspnea No date: Essential tremor No date: Hepatic steatosis No date: HLD (hyperlipidemia) No date: Hypertension No date: Hypothyroidism No date: Imbalance No date: Myocardial infarction ([place]) 12/06/1999: Presence of permanent cardiac pacemaker     Comment:  a.) Medtronic device placed for SSS; b.) device failure               - developed CHB --> device replaced 05/07/2006; c.) pulse              generator changed 02/08/2015; d.) pulse generator changed              05/12/2019 No date: Skin cancer No date: SSS (sick sinus syndrome) ([place])  Comment:  a.) s/p PPM placement No date: Tachy-brady syndrome (HCC)  Reproductive/Obstetrics                           Anesthesia Physical Anesthesia Plan  ASA: 3  Anesthesia Plan: General   Post-op Pain Management: Gabapentin PO (pre-op)*, Celebrex PO (pre-op)* and Tylenol PO (pre-op)*   Induction: Intravenous  PONV Risk Score and Plan: 3 and Ondansetron, Dexamethasone and Treatment may vary due to age or medical condition  Airway Management Planned: Oral  ETT  Additional Equipment: None  Intra-op Plan:   Post-operative Plan: Extubation in OR  Informed Consent: I have reviewed the patients History and Physical, chart, labs and discussed the procedure including the risks, benefits and alternatives for the proposed anesthesia with the patient or authorized representative who has indicated his/her understanding and acceptance.     Dental advisory given  Plan Discussed with: CRNA and Surgeon  Anesthesia Plan Comments: (Discussed risks of anesthesia with patient and two daughters at bedside, including PONV, sore throat, lip/dental/eye damage. Rare risks discussed as well, such as cardiorespiratory and neurological sequelae, and allergic reactions. Discussed the role of CRNA in patient's perioperative care. Patient understands.)        Anesthesia Quick Evaluation

## 2022-06-06 NOTE — Anesthesia Procedure Notes (Signed)
Procedure Name: Intubation Date/Time: 06/06/2022 7:56 AM  Performed by: Cammie Sickle, CRNAPre-anesthesia Checklist: Patient identified, Patient being monitored, Timeout performed, Emergency Drugs available and Suction available Patient Re-evaluated:Patient Re-evaluated prior to induction Oxygen Delivery Method: Circle system utilized Preoxygenation: Pre-oxygenation with 100% oxygen Induction Type: IV induction Ventilation: Mask ventilation without difficulty Laryngoscope Size: 3 and McGraph Grade View: Grade I Tube type: Oral Tube size: 7.0 mm Number of attempts: 1 Airway Equipment and Method: Stylet Placement Confirmation: ETT inserted through vocal cords under direct vision, positive ETCO2 and breath sounds checked- equal and bilateral Secured at: 22 cm Tube secured with: Tape Dental Injury: Teeth and Oropharynx as per pre-operative assessment

## 2022-06-06 NOTE — Transfer of Care (Signed)
Immediate Anesthesia Transfer of Care Note  Patient: Samuel Mack  Procedure(s) Performed: XI ROBOTIC ASSISTED LAPAROSCOPIC CHOLECYSTECTOMY (Abdomen) INDOCYANINE GREEN FLUORESCENCE IMAGING (ICG)  Patient Location: PACU  Anesthesia Type:General  Level of Consciousness: drowsy  Airway & Oxygen Therapy: Patient Spontanous Breathing and Patient connected to face mask oxygen  Post-op Assessment: Report given to RN and Post -op Vital signs reviewed and stable  Post vital signs: Reviewed and stable  Last Vitals:  Vitals Value Taken Time  BP 126/64 06/06/22 0900  Temp 35.9 0900  Pulse 70 06/06/22 0907  Resp 17 06/06/22 0907  SpO2 100 % 06/06/22 0907  Vitals shown include unvalidated device data.  Last Pain:  Vitals:   06/06/22 0638  TempSrc: Temporal  PainSc: 0-No pain      Patients Stated Pain Goal: 0 (16/10/96 0454)  Complications: No notable events documented.

## 2022-06-06 NOTE — Interval H&P Note (Signed)
History and Physical Interval Note:  06/06/2022 7:34 AM  Samuel Mack  has presented today for surgery, with the diagnosis of Cholelithiasis.  The various methods of treatment have been discussed with the patient and family. After consideration of risks, benefits and other options for treatment, the patient has consented to  Procedure(s): XI ROBOTIC ASSISTED LAPAROSCOPIC CHOLECYSTECTOMY (N/A) [place] (ICG) (N/A) as a surgical intervention.  The patient's history has been reviewed, patient examined, no change in status, stable for surgery.  I have reviewed the patient's chart and labs.  Questions were answered to the patient's satisfaction.     [place]

## 2022-06-06 NOTE — Anesthesia Postprocedure Evaluation (Signed)
Anesthesia Post Note  Patient: Samuel Mack  Procedure(s) Performed: XI ROBOTIC ASSISTED LAPAROSCOPIC CHOLECYSTECTOMY (Abdomen) INDOCYANINE GREEN FLUORESCENCE IMAGING (ICG)  Patient location during evaluation: PACU Anesthesia Type: General Level of consciousness: awake and alert Pain management: pain level controlled Vital Signs Assessment: post-procedure vital signs reviewed and stable Respiratory status: spontaneous breathing, nonlabored ventilation, respiratory function stable and patient connected to nasal cannula oxygen Cardiovascular status: blood pressure returned to baseline and stable Postop Assessment: no apparent nausea or vomiting Anesthetic complications: no   No notable events documented.   Last Vitals:  Vitals:   06/06/22 0930 06/06/22 0952  BP: 138/69 133/72  Pulse: 75 73  Resp: 16 16  Temp: (!) 36.1 C (!) 36.1 C  SpO2: 96% 99%    Last Pain:  Vitals:   06/06/22 0952  TempSrc: Temporal  PainSc:                  Arita Miss

## 2022-06-07 LAB — SURGICAL PATHOLOGY

## 2022-06-18 ENCOUNTER — Ambulatory Visit (INDEPENDENT_AMBULATORY_CARE_PROVIDER_SITE_OTHER): Payer: MEDICARE | Admitting: Physician Assistant

## 2022-06-18 ENCOUNTER — Encounter: Payer: Self-pay | Admitting: Physician Assistant

## 2022-06-18 ENCOUNTER — Other Ambulatory Visit: Payer: Self-pay

## 2022-06-18 VITALS — BP 144/81 | HR 103 | Temp 97.7°F | Ht 66.5 in | Wt 214.0 lb

## 2022-06-18 DIAGNOSIS — K8044 Calculus of bile duct with chronic cholecystitis without obstruction: Secondary | ICD-10-CM

## 2022-06-18 DIAGNOSIS — K8020 Calculus of gallbladder without cholecystitis without obstruction: Secondary | ICD-10-CM

## 2022-06-18 DIAGNOSIS — Z09 Encounter for follow-up examination after completed treatment for conditions other than malignant neoplasm: Secondary | ICD-10-CM

## 2022-06-18 NOTE — Patient Instructions (Signed)

## 2022-06-18 NOTE — Progress Notes (Signed)
[facility] POST-OP OFFICE VISIT  06/18/2022  HPI: Samuel Mack is a 88 y.o. male 12 days s/p robotic assisted laparoscopic cholecystectomy for history of cholangitis from choledocholithiasis with Dr Samuel Mack   He has done remarkably well since surgery and given the circumstances No fever, chills, nausea, emesis, or diarrhea No issues with incisions Tolerating PO; ambulating well No other complaints   Vital signs: BP (!) 144/81   Pulse (!) 103   Temp 97.7 F (36.5 C) (Oral)   Ht 5' 6.5" (1.689 m)   Wt 214 lb (97.1 kg)   SpO2 97%   BMI 34.02 kg/m    Physical Exam: Constitutional: Well appearing male, NAD Abdomen: Soft, non-tender, non-distended, no rebound/guarding Skin: Laparoscopic incisions are healing well. There is a slight amout of erythema around the umbilical incision, does not appear overtly infected, no drainage   Assessment/Plan: This is a 88 y.o. male 12 days s/p robotic assisted laparoscopic cholecystectomy for history of cholangitis from choledocholithiasis with Dr Samuel Mack    - Pain control prn  - Reviewed wound care recommendation  - Reviewed lifting restrictions; 4 weeks total  - Reviewed surgical pathology; Chronic cholecystitis, cholelithiasis, negative for malignancy   - He can follow up on as needed basis; He, and his daughter, understand to call with questions/concerns  -- [name], PA-C [facility] 06/18/2022, 3:08 PM M-F: 7am - 4pm

## 2022-07-02 ENCOUNTER — Telehealth: Payer: Self-pay

## 2022-07-02 NOTE — Telephone Encounter (Signed)
Cardiac clearance received -Dr.Masoud-medium risk-patient optimized for surgery.

## 2022-07-22 ENCOUNTER — Ambulatory Visit (INDEPENDENT_AMBULATORY_CARE_PROVIDER_SITE_OTHER): Payer: MEDICARE | Admitting: Internal Medicine

## 2022-07-22 VITALS — BP 129/71

## 2022-07-22 DIAGNOSIS — I10 Essential (primary) hypertension: Secondary | ICD-10-CM | POA: Diagnosis not present

## 2022-07-22 DIAGNOSIS — Z950 Presence of cardiac pacemaker: Secondary | ICD-10-CM | POA: Diagnosis not present

## 2022-07-22 DIAGNOSIS — E039 Hypothyroidism, unspecified: Secondary | ICD-10-CM

## 2022-07-22 DIAGNOSIS — I495 Sick sinus syndrome: Secondary | ICD-10-CM

## 2022-07-22 NOTE — Assessment & Plan Note (Signed)
Stable on medicine

## 2022-07-22 NOTE — Progress Notes (Signed)
Established Patient Office Visit  Subjective:  Patient ID: Samuel Mack, male    DOB: [birth year]  Age: 88 y.o. MRN: [id number]  CC: No chief complaint on file.   HPI  Samuel Mack presents for doing well  Past Medical History:  Diagnosis Date   Aortic atherosclerosis ([place])    Asthma    Atrial fibrillation ([place])    a.) CHA2DS2VASc = 5 (age x2, HTN, previous MI, T2DM);  b.) rate/rhythm maintained on oral diltiazem + nebivolol; no chronic anticoagulation   CHB (complete heart block) ([place])    a.) PPM in place   Cholelithiasis    CKD (chronic kidney disease), stage III ([place])    Cognitive impairment    Complication of anesthesia    a.) anesthesia awareness with previous PPM insertion   COPD (chronic obstructive pulmonary disease) (HCC)    Deafness in left ear    Decreased vision of left eye    Diet-controlled type 2 diabetes mellitus (HCC)    Dyspnea    Essential tremor    Hepatic steatosis    HLD (hyperlipidemia)    Hypertension    Hypothyroidism    Imbalance    Myocardial infarction ([place])    Presence of permanent cardiac pacemaker 12/06/1999   a.) Medtronic device placed for SSS; b.) device failure - developed CHB --> device replaced 05/07/2006; c.) pulse generator changed 02/08/2015; d.) pulse generator changed 05/12/2019   Skin cancer    SSS (sick sinus syndrome) ([place])    a.) s/p PPM placement   Tachy-brady syndrome [facility])     Past Surgical History:  Procedure Laterality Date   CATARACT EXTRACTION Left    CATARACT EXTRACTION W/PHACO Left 12/11/2016   Procedure: CATARACT EXTRACTION PHACO AND INTRAOCULAR LENS PLACEMENT ([place]);  Surgeon: Leandrew Koyanagi, MD;  Location: [facility];  Service: Ophthalmology;  Laterality: Left;  IVA TOPICAL LEFT   ERCP N/A 04/19/2022   Procedure: ENDOSCOPIC RETROGRADE CHOLANGIOPANCREATOGRAPHY (ERCP);  Surgeon: Lucilla Lame, MD;  Location: [facility] ENDOSCOPY;  Service: Endoscopy;  Laterality: N/A;   NASAL SINUS SURGERY      PACEMAKER GENERATOR CHANGE  05/07/2006   Procedure: PACEMAKER GENERATOR CHANGE; Location: [place]; Surgeon: Cletis Athens, MD   PACEMAKER GENERATOR CHANGE  02/08/2015   Procedure: PACEMAKER GENERATOR CHANGE; Location: [place]; Surgeon: Cletis Athens, MD   PACEMAKER INSERTION  12/06/1999   Procedure: PACEMAKER INSERTION; Location: [facility]; Surgeon: Cletis Athens, MD   PACEMAKER INSERTION Left 05/12/2019   Procedure: INSERTION PACEMAKER, LEFT;  Surgeon: Cletis Athens, MD;  Location: [facility] ORS;  Service: Cardiovascular;  Laterality: Left;   PROSTATE SURGERY     TRANSURETHRAL RESECTION OF PROSTATE N/A 06/20/2020   Procedure: TRANSURETHRAL RESECTION OF THE PROSTATE (TURP);  Surgeon: [name], MD;  Location: [facility] ORS;  Service: Urology;  Laterality: N/A;    History reviewed. No pertinent family history.  Social History   Socioeconomic History   Marital status: Widowed    Spouse name: Not on file   Number of children: Not on file   Years of education: Not on file   Highest education level: Not on file  Occupational History   Not on file  Tobacco Use   Smoking status: Former    Types: Cigarettes    Quit date: 01/26/1979    Years since quitting: 43.5   Smokeless tobacco: Never  Vaping Use   Vaping Use: Never used  Substance and Sexual Activity   Alcohol use: No   Drug use: No   Sexual activity: Not  on file  Other Topics Concern   Not on file  Social History Narrative   Not on file   Social Determinants of Health   Financial Resource Strain: Not on file  Food Insecurity: Not on file  Transportation Needs: Not on file  Physical Activity: Not on file  Stress: Not on file  Social Connections: Not on file  Intimate Partner Violence: Not on file     Current Outpatient Medications:    aspirin 81 MG tablet, Take 81 mg by mouth daily., Disp: , Rfl:    atorvastatin (LIPITOR) 10 MG tablet, Take 10 mg by mouth at bedtime., Disp: , Rfl:    cholecalciferol (VITAMIN D) 25 MCG (1000 UNIT)  tablet, Take 1,000 Units by mouth in the morning and at bedtime., Disp: , Rfl:    Coenzyme Q10 100 MG TABS, Take 100 mg by mouth daily. , Disp: , Rfl:    digoxin (LANOXIN) 0.125 MG tablet, TAKE 1 TABLET BY MOUTH EVERY DAY, Disp: 90 tablet, Rfl: 3   diltiazem (DILACOR XR) 180 MG 24 hr capsule, Take 180 mg by mouth daily., Disp: , Rfl:    HYDROcodone-acetaminophen (NORCO/VICODIN) 5-325 MG tablet, Take 1-2 tablets by mouth every 6 (six) hours as needed for moderate pain., Disp: 15 tablet, Rfl: 0   levocetirizine (XYZAL) 5 MG tablet, Take 5 mg by mouth every evening., Disp: , Rfl:    levothyroxine (SYNTHROID) 175 MCG tablet, Take 175 mcg by mouth daily before breakfast. , Disp: , Rfl:    losartan (COZAAR) 100 MG tablet, Take 100 mg by mouth daily., Disp: , Rfl:    Multiple Vitamins-Minerals (PRESERVISION AREDS PO), Take by mouth 2 (two) times daily., Disp: , Rfl:    nebivolol (BYSTOLIC) 10 MG tablet, Take 10 mg by mouth daily., Disp: , Rfl:    Omega-3 Fatty Acids (FISH OIL) 1200 MG CAPS, Take 1,200 mg by mouth 3 (three) times daily., Disp: , Rfl:    potassium gluconate 595 (99 K) MG TABS tablet, Take 595 mg by mouth daily., Disp: , Rfl:    vitamin B-12 (CYANOCOBALAMIN) 1000 MCG tablet, Take 1,000 mcg by mouth daily., Disp: , Rfl:    vitamin C (ASCORBIC ACID) 500 MG tablet, Take 500 mg by mouth daily., Disp: , Rfl:    vitamin E 180 MG (400 UNITS) capsule, Take 400 Units by mouth daily., Disp: , Rfl:    Allergies  Allergen Reactions   Bee Venom Swelling   Penicillins Swelling    Received 1st generation cephalosporin (CEFAZOLIN) on 05/12/2019 without documented ADRs.   Patient reports he swells and it was "something in the pcn" 50 year ago    ROS Review of Systems  Constitutional: Negative.   HENT: Negative.    Eyes: Negative.   Respiratory: Negative.    Cardiovascular: Negative.   Gastrointestinal: Negative.   Endocrine: Negative.   Genitourinary: Negative.   Musculoskeletal: Negative.    Skin: Negative.   Allergic/Immunologic: Negative.   Neurological: Negative.   Hematological: Negative.   Psychiatric/Behavioral: Negative.    All other systems reviewed and are negative.     Objective:    Physical Exam Vitals reviewed.  Constitutional:      Appearance: Normal appearance.  HENT:     Mouth/Throat:     Mouth: Mucous membranes are moist.  Eyes:     Pupils: Pupils are equal, round, and reactive to light.  Neck:     Vascular: No carotid bruit.  Cardiovascular:     Rate and  Rhythm: Normal rate and regular rhythm.     Pulses: Normal pulses.     Heart sounds: Normal heart sounds.  Pulmonary:     Effort: Pulmonary effort is normal.     Breath sounds: Normal breath sounds.  Abdominal:     General: Bowel sounds are normal.     Palpations: Abdomen is soft. There is no hepatomegaly, splenomegaly or mass.     Tenderness: There is no abdominal tenderness.     Hernia: No hernia is present.  Musculoskeletal:     Cervical back: Neck supple.     Right lower leg: No edema.     Left lower leg: No edema.  Skin:    Findings: No rash.  Neurological:     Mental Status: He is alert and oriented to person, place, and time.     Motor: No weakness.  Psychiatric:        Mood and Affect: Mood normal.        Behavior: Behavior normal.     BP 129/71  Wt Readings from Last 3 Encounters:  06/18/22 214 lb (97.1 kg)  06/06/22 214 lb 1.1 oz (97.1 kg)  05/31/22 214 lb (97.1 kg)     Health Maintenance Due  Topic Date Due   Medicare Annual Wellness (AWV)  Never done   TETANUS/TDAP  Never done   Zoster Vaccines- Shingrix (1 of 2) Never done   Pneumonia Vaccine 32+ Years old (1 - PCV) Never done   COVID-19 Vaccine (4 - Pfizer series) 09/20/2020   INFLUENZA VACCINE  04/16/2022    There are no preventive care reminders to display for this patient.  No results found for: "TSH" Lab Results  Component Value Date   WBC 11.1 (H) 04/20/2022   HGB 10.5 (L) 04/20/2022   HCT  32.1 (L) 04/20/2022   MCV 89.4 04/20/2022   PLT 175 04/20/2022   Lab Results  Component Value Date   NA 131 (L) 04/20/2022   K 4.4 04/20/2022   CO2 19 (L) 04/20/2022   GLUCOSE 85 04/20/2022   BUN 28 (H) 04/20/2022   CREATININE 1.52 (H) 04/20/2022   BILITOT 1.7 (H) 04/20/2022   ALKPHOS 205 (H) 04/20/2022   AST 47 (H) 04/20/2022   ALT 127 (H) 04/20/2022   PROT 6.4 (L) 04/20/2022   ALBUMIN 3.2 (L) 04/20/2022   CALCIUM 8.7 (L) 04/20/2022   ANIONGAP 9 04/20/2022   No results found for: "CHOL" No results found for: "HDL" No results found for: "LDLCALC" No results found for: "TRIG" No results found for: "CHOLHDL" No results found for: "HGBA1C"    Assessment & Plan:   Problem List Items Addressed This Visit       Cardiovascular and Mediastinum   SSS (sick sinus syndrome) ([place]) - Primary    Stable at the present time      Essential hypertension     Endocrine   Hypothyroidism    Stable on medicine      Note: Medical Device Follow-up  Patient pacemaker was interrogated by pacemakers analyzer, battery status is okay.  No programming changes were indicated after the review of the data.  Histogram shows no change since the last interrogation Atrial and ventricular sensing thresholds were found to be acceptable Impedance was checked and it was found to be normal.  Thresholds were found to be okay on evaluation of rhythm problem.  No high rate or low rate arrhythmia were noted.  Estimated battery longevity is 7.4  . I have personally  reviewed the device data and amended the report as necessary.   No orders of the defined types were placed in this encounter.   Follow-up: No follow-ups on file.    Cletis Athens, MD

## 2022-07-22 NOTE — Assessment & Plan Note (Signed)
Stable at the present time. 

## 2022-08-27 ENCOUNTER — Encounter: Payer: Self-pay | Admitting: Family Medicine

## 2022-08-27 DIAGNOSIS — R112 Nausea with vomiting, unspecified: Secondary | ICD-10-CM

## 2022-08-27 DIAGNOSIS — R7401 Elevation of levels of liver transaminase levels: Secondary | ICD-10-CM

## 2022-08-27 HISTORY — DX: Nausea with vomiting, unspecified: R11.2

## 2023-01-18 ENCOUNTER — Inpatient Hospital Stay
Admission: EM | Admit: 2023-01-18 | Discharge: 2023-01-24 | DRG: 178 | Disposition: A | Discharge status: Hospice | Payer: MEDICARE | Attending: Student | Admitting: Student

## 2023-01-18 ENCOUNTER — Emergency Department: Payer: MEDICARE

## 2023-01-18 ENCOUNTER — Other Ambulatory Visit: Payer: Self-pay

## 2023-01-18 DIAGNOSIS — G250 Essential tremor: Secondary | ICD-10-CM | POA: Diagnosis present

## 2023-01-18 DIAGNOSIS — Z1152 Encounter for screening for COVID-19: Secondary | ICD-10-CM

## 2023-01-18 DIAGNOSIS — N1832 Chronic kidney disease, stage 3b: Secondary | ICD-10-CM | POA: Diagnosis present

## 2023-01-18 DIAGNOSIS — E876 Hypokalemia: Secondary | ICD-10-CM | POA: Diagnosis not present

## 2023-01-18 DIAGNOSIS — I129 Hypertensive chronic kidney disease with stage 1 through stage 4 chronic kidney disease, or unspecified chronic kidney disease: Secondary | ICD-10-CM | POA: Diagnosis present

## 2023-01-18 DIAGNOSIS — H547 Unspecified visual loss: Secondary | ICD-10-CM | POA: Diagnosis present

## 2023-01-18 DIAGNOSIS — I252 Old myocardial infarction: Secondary | ICD-10-CM

## 2023-01-18 DIAGNOSIS — J690 Pneumonitis due to inhalation of food and vomit: Secondary | ICD-10-CM | POA: Diagnosis not present

## 2023-01-18 DIAGNOSIS — Z9842 Cataract extraction status, left eye: Secondary | ICD-10-CM

## 2023-01-18 DIAGNOSIS — F0392 Unspecified dementia, unspecified severity, with psychotic disturbance: Secondary | ICD-10-CM | POA: Diagnosis present

## 2023-01-18 DIAGNOSIS — Z79890 Hormone replacement therapy (postmenopausal): Secondary | ICD-10-CM

## 2023-01-18 DIAGNOSIS — J189 Pneumonia, unspecified organism: Secondary | ICD-10-CM | POA: Diagnosis not present

## 2023-01-18 DIAGNOSIS — Z9079 Acquired absence of other genital organ(s): Secondary | ICD-10-CM

## 2023-01-18 DIAGNOSIS — R339 Retention of urine, unspecified: Secondary | ICD-10-CM | POA: Diagnosis present

## 2023-01-18 DIAGNOSIS — Z79899 Other long term (current) drug therapy: Secondary | ICD-10-CM

## 2023-01-18 DIAGNOSIS — Z515 Encounter for palliative care: Secondary | ICD-10-CM

## 2023-01-18 DIAGNOSIS — R4182 Altered mental status, unspecified: Secondary | ICD-10-CM | POA: Diagnosis not present

## 2023-01-18 DIAGNOSIS — I4891 Unspecified atrial fibrillation: Secondary | ICD-10-CM | POA: Diagnosis present

## 2023-01-18 DIAGNOSIS — I10 Essential (primary) hypertension: Secondary | ICD-10-CM | POA: Diagnosis present

## 2023-01-18 DIAGNOSIS — Z66 Do not resuscitate: Secondary | ICD-10-CM | POA: Diagnosis present

## 2023-01-18 DIAGNOSIS — I495 Sick sinus syndrome: Secondary | ICD-10-CM | POA: Diagnosis present

## 2023-01-18 DIAGNOSIS — D329 Benign neoplasm of meninges, unspecified: Secondary | ICD-10-CM | POA: Diagnosis present

## 2023-01-18 DIAGNOSIS — E785 Hyperlipidemia, unspecified: Secondary | ICD-10-CM | POA: Diagnosis present

## 2023-01-18 DIAGNOSIS — Z91030 Bee allergy status: Secondary | ICD-10-CM

## 2023-01-18 DIAGNOSIS — Z950 Presence of cardiac pacemaker: Secondary | ICD-10-CM

## 2023-01-18 DIAGNOSIS — E1122 Type 2 diabetes mellitus with diabetic chronic kidney disease: Secondary | ICD-10-CM | POA: Diagnosis present

## 2023-01-18 DIAGNOSIS — Z85828 Personal history of other malignant neoplasm of skin: Secondary | ICD-10-CM

## 2023-01-18 DIAGNOSIS — Z880 Allergy status to penicillin: Secondary | ICD-10-CM

## 2023-01-18 DIAGNOSIS — Z6830 Body mass index (BMI) 30.0-30.9, adult: Secondary | ICD-10-CM

## 2023-01-18 DIAGNOSIS — F03911 Unspecified dementia, unspecified severity, with agitation: Secondary | ICD-10-CM | POA: Diagnosis present

## 2023-01-18 DIAGNOSIS — J4489 Other specified chronic obstructive pulmonary disease: Secondary | ICD-10-CM | POA: Diagnosis present

## 2023-01-18 DIAGNOSIS — H9192 Unspecified hearing loss, left ear: Secondary | ICD-10-CM | POA: Diagnosis present

## 2023-01-18 DIAGNOSIS — Z7982 Long term (current) use of aspirin: Secondary | ICD-10-CM

## 2023-01-18 DIAGNOSIS — Z961 Presence of intraocular lens: Secondary | ICD-10-CM | POA: Diagnosis present

## 2023-01-18 DIAGNOSIS — Z87891 Personal history of nicotine dependence: Secondary | ICD-10-CM

## 2023-01-18 DIAGNOSIS — K760 Fatty (change of) liver, not elsewhere classified: Secondary | ICD-10-CM | POA: Diagnosis present

## 2023-01-18 DIAGNOSIS — E663 Overweight: Secondary | ICD-10-CM | POA: Diagnosis present

## 2023-01-18 DIAGNOSIS — E039 Hypothyroidism, unspecified: Secondary | ICD-10-CM | POA: Diagnosis present

## 2023-01-18 LAB — COMPREHENSIVE METABOLIC PANEL
ALT: 20 U/L (ref 0–44)
AST: 38 U/L (ref 15–41)
Albumin: 4 g/dL (ref 3.5–5.0)
Alkaline Phosphatase: 94 U/L (ref 38–126)
Anion gap: 10 (ref 5–15)
BUN: 25 mg/dL — ABNORMAL HIGH (ref 8–23)
CO2: 24 mmol/L (ref 22–32)
Calcium: 10.9 mg/dL — ABNORMAL HIGH (ref 8.9–10.3)
Chloride: 102 mmol/L (ref 98–111)
Creatinine, Ser: 1.69 mg/dL — ABNORMAL HIGH (ref 0.61–1.24)
GFR, Estimated: 38 mL/min — ABNORMAL LOW (ref >60)
Glucose, Bld: 110 mg/dL — ABNORMAL HIGH (ref 70–99)
Potassium: 4.2 mmol/L (ref 3.5–5.1)
Sodium: 136 mmol/L (ref 135–145)
Total Bilirubin: 1.2 mg/dL (ref 0.3–1.2)
Total Protein: 7.9 g/dL (ref 6.5–8.1)

## 2023-01-18 LAB — URINE DRUG SCREEN, QUALITATIVE (ARMC ONLY)
Amphetamines, Ur Screen: NOT DETECTED
Barbiturates, Ur Screen: NOT DETECTED
Benzodiazepine, Ur Scrn: NOT DETECTED
Cannabinoid 50 Ng, Ur ~~LOC~~: NOT DETECTED
Cocaine Metabolite,Ur ~~LOC~~: NOT DETECTED
MDMA (Ecstasy)Ur Screen: NOT DETECTED
Methadone Scn, Ur: NOT DETECTED
Opiate, Ur Screen: NOT DETECTED
Phencyclidine (PCP) Ur S: NOT DETECTED
Tricyclic, Ur Screen: NOT DETECTED

## 2023-01-18 LAB — URINALYSIS, ROUTINE W REFLEX MICROSCOPIC
Bilirubin Urine: NEGATIVE
Glucose, UA: NEGATIVE mg/dL
Hgb urine dipstick: NEGATIVE
Ketones, ur: NEGATIVE mg/dL
Leukocytes,Ua: NEGATIVE
Nitrite: NEGATIVE
Protein, ur: NEGATIVE mg/dL
Specific Gravity, Urine: 1.013 (ref 1.005–1.030)
pH: 6 (ref 5.0–8.0)

## 2023-01-18 LAB — RESP PANEL BY RT-PCR (RSV, FLU A&B, COVID)  RVPGX2
Influenza A by PCR: NEGATIVE
Influenza B by PCR: NEGATIVE
Resp Syncytial Virus by PCR: NEGATIVE
SARS Coronavirus 2 by RT PCR: NEGATIVE

## 2023-01-18 LAB — CBC
HCT: 44 % (ref 39.0–52.0)
Hemoglobin: 14 g/dL (ref 13.0–17.0)
MCH: 30.1 pg (ref 26.0–34.0)
MCHC: 31.8 g/dL (ref 30.0–36.0)
MCV: 94.6 fL (ref 80.0–100.0)
Platelets: 186 10*3/uL (ref 150–400)
RBC: 4.65 MIL/uL (ref 4.22–5.81)
RDW: 14.1 % (ref 11.5–15.5)
WBC: 12.2 10*3/uL — ABNORMAL HIGH (ref 4.0–10.5)
nRBC: 0 % (ref 0.0–0.2)

## 2023-01-18 MED ORDER — LORAZEPAM 2 MG PO TABS
2.0000 mg | ORAL_TABLET | Freq: Once | ORAL | Status: AC
  Administered 2023-01-18: 2 mg via ORAL
  Filled 2023-01-18: qty 1

## 2023-01-18 MED ORDER — SODIUM CHLORIDE 0.9 % IV BOLUS
1000.0000 mL | Freq: Once | INTRAVENOUS | Status: AC
  Administered 2023-01-18: 1000 mL via INTRAVENOUS

## 2023-01-18 NOTE — ED Notes (Signed)
Patient is ambulatory, and moves about with no difficulty.  He is however having some mental difficulties.  Both of his daughters report that his mental status has changed drastically in the past two days.  The patient is not aware that this is a hospital.  He alternates between thinking he is at a place of business and a school.  When watching television, he believed that he was looking through a window, and that the people he was seeing were real.  He is calm, and is able to be redirected with some effort back to a seated position, or back into his room if he decides to wander.  He does have a Recruitment consultant with him at this time, however the patient will just get up and begin walking away with no warning.  His daughters have just left the facility to go home.

## 2023-01-18 NOTE — ED Triage Notes (Signed)
Pt to ED from home [facility], family reports pt has been disoriented this week and worse since last night, having hallucinations, thought house was on fire today and was standing in front yard with car keys. EMS reports possible UTI and strong urine smell.  [facility] sent with labs.
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
## 2023-01-18 NOTE — H&P (Incomplete)
History and Physical     Patient: Samuel Mack ZOX:[id number] DOB: 29-Jan-1930 DOA: 01/18/2023 DOS: the patient was seen and examined on 01/18/2023 PCP: Sherrie Mustache, MD   Patient coming from: {Point_of_Origin:[address]}  Chief Complaint: ***  HISTORY OF PRESENT ILLNESS: HAMED FINKENBINDER is an 89 y.o. male ***  Past Medical History:  Diagnosis Date  . Aortic atherosclerosis (HCC)   . Asthma   . Atrial fibrillation (HCC)    a.) CHA2DS2VASc = 5 (age x2, HTN, previous MI, T2DM);  b.) rate/rhythm maintained on oral diltiazem + nebivolol; no chronic anticoagulation  . CHB (complete heart block) (HCC)    a.) PPM in place  . Cholelithiasis   . CKD (chronic kidney disease), stage III (HCC)   . Cognitive impairment   . Complication of anesthesia    a.) anesthesia awareness with previous PPM insertion  . COPD (chronic obstructive pulmonary disease) (HCC)   . Deafness in left ear   . Decreased vision of left eye   . Diet-controlled type 2 diabetes mellitus (HCC)   . Dyspnea   . Essential tremor   . Hepatic steatosis   . HLD (hyperlipidemia)   . Hypertension   . Hypothyroidism   . Imbalance   . Myocardial infarction (HCC)   . Nausea and vomiting 08/27/2022  . Presence of permanent cardiac pacemaker 12/06/1999   a.) Med[employer]onic device placed for SSS; b.) device failure - developed CHB --> device replaced 05/07/2006; c.) pulse generator changed 02/08/2015; d.) pulse generator changed 05/12/2019  . Skin cancer   . SSS (sick sinus syndrome) (HCC)    a.) s/p PPM placement  . Tachy-brady syndrome (HCC)    Review of Systems Allergies  Allergen Reactions  . Bee Venom Swelling  . Penicillins Swelling    Received 1st generation cephalosporin (CEFAZOLIN) on 05/12/2019 without documented ADRs.   Patient reports he swells and it was "something in the pcn" 50 year ago   Past Surgical History:  Procedure Laterality Date  . CATARACT EXTRACTION Left   . CATARACT EXTRACTION W/PHACO Left  12/11/2016   Procedure: CATARACT EXTRACTION PHACO AND INTRAOCULAR LENS PLACEMENT (IOC);  Surgeon: Lockie Mola, MD;  [facility]: [facility];  Service: Ophthalmology;  Laterality: Left;  IVA TOPICAL LEFT  . ERCP N/A 04/19/2022   Procedure: ENDOSCOPIC RETROGRADE CHOLANGIOPANCREATOGRAPHY (ERCP);  Surgeon: Midge Minium, MD;  Location: [facility];  Service: Endoscopy;  Laterality: N/A;  . NASAL SINUS SURGERY    . PACEMAKER GENERATOR CHANGE  05/07/2006   Procedure: PACEMAKER GENERATOR CHANGE; Location: [facility]; Surgeon: Corky Downs, MD  . PACEMAKER GENERATOR CHANGE  02/08/2015   Procedure: PACEMAKER GENERATOR CHANGE; Location: [facility]; Surgeon: Corky Downs, MD  . PACEMAKER INSERTION  12/06/1999   Procedure: PACEMAKER INSERTION; Location: [facility]; Surgeon: Corky Downs, MD  . PACEMAKER INSERTION Left 05/12/2019   Procedure: INSERTION PACEMAKER, LEFT;  Surgeon: Corky Downs, MD;  Location: [facility];  Service: Cardiovascular;  Laterality: Left;  . PROSTATE SURGERY    . TRANSURETHRAL RESECTION OF PROSTATE N/A 06/20/2020   Procedure: TRANSURETHRAL RESECTION OF THE PROSTATE (TURP);  Surgeon: Riki Altes, MD;  Location: [facility];  Service: Urology;  Laterality: N/A;   MEDICATIONS: Prior to Admission medications   Medication Sig Start Date End Date Taking? Authorizing Provider  aspirin 81 MG tablet Take 81 mg by mouth daily.   Yes [provider]  atorvastatin (LIPITOR) 10 MG tablet Take 10 mg by mouth at bedtime.   Yes [provider]  cholecalciferol (VITAMIN D) 25  MCG (1000 UNIT) tablet Take 1,000 Units by mouth in the morning and at bedtime.   Yes [provider]  Coenzyme Q10 100 MG TABS Take 100 mg by mouth daily.    Yes [provider]  digoxin (LANOXIN) 0.125 MG tablet TAKE 1 TABLET BY MOUTH EVERY DAY 03/12/22  Yes Masoud, Renda Rolls, MD  diltiazem (CARDIZEM LA) 180 MG 24 hr tablet Take 180 mg by mouth daily. 01/14/23  Yes [provider]   levocetirizine (XYZAL) 5 MG tablet Take 5 mg by mouth every evening.   Yes [provider]  levothyroxine (SYNTHROID) 150 MCG tablet Take 150 mcg by mouth every morning. 12/02/22  Yes [provider]  losartan (COZAAR) 50 MG tablet Take 50 mg by mouth daily. 01/02/23  Yes [provider]  Multiple Vitamins-Minerals (PRESERVISION AREDS PO) Take by mouth 2 (two) times daily.   Yes [provider]  nebivolol (BYSTOLIC) 10 MG tablet Take 10 mg by mouth daily.   Yes [provider]  Omega-3 Fatty Acids (FISH OIL) 1200 MG CAPS Take 1,200 mg by mouth 3 (three) times daily.   Yes [provider]  potassium gluconate 595 (99 K) MG TABS tablet Take 595 mg by mouth daily.   Yes [provider]  spironolactone (ALDACTONE) 25 MG tablet Take 25 mg by mouth daily. 11/04/22  Yes [provider]  vitamin B-12 (CYANOCOBALAMIN) 1000 MCG tablet Take 1,000 mcg by mouth daily.   Yes [provider]  vitamin C (ASCORBIC ACID) 500 MG tablet Take 500 mg by mouth daily.   Yes [provider]  vitamin E 180 MG (400 UNITS) capsule Take 400 Units by mouth daily.   Yes [provider]   . LORazepam  2 mg Oral Once        ED Course: Pt in Ed *** Vitals:   01/18/23 1334 01/18/23 1335 01/18/23 1615 01/18/23 2200  BP: 113/61  134/89 (!) 144/80  Pulse: 70  94 74  Resp: 20  20 16   Temp: 97.7 F (36.5 C)   98.5 F (36.9 C)  TempSrc: Oral   Oral  SpO2: 98%  92% 96%  Weight:  95.7 kg    Height:  5\' 7"  (1.702 m)     No intake/output data recorded. SpO2: 96 % Blood work in ed shows: *** Results for orders placed or performed during the hospital encounter of 01/18/23 (from the past 72 hour(s))  Comprehensive metabolic panel     Status: Abnormal   Collection Time: 01/18/23  1:37 PM  Result Value Ref Range   Sodium 136 135 - 145 mmol/L   Potassium 4.2 3.5 - 5.1 mmol/L   Chloride 102 98 - 111 mmol/L   CO2 24 22 - 32  mmol/L   Glucose, Bld 110 (H) 70 - 99 mg/dL    Comment: Glucose reference range applies only to samples taken after fasting for at least 8 hours.   BUN 25 (H) 8 - 23 mg/dL   Creatinine, Ser 8.29 (H) 0.61 - 1.24 mg/dL   Calcium 56.2 (H) 8.9 - 10.3 mg/dL   Total Protein 7.9 6.5 - 8.1 g/dL   Albumin 4.0 3.5 - 5.0 g/dL   AST 38 15 - 41 U/L   ALT 20 0 - 44 U/L   Alkaline Phosphatase 94 38 - 126 U/L   Total Bilirubin 1.2 0.3 - 1.2 mg/dL   GFR, Estimated 38 (L) >60 mL/min    Comment: (NOTE) Calculated using the  CKD-EPI Creatinine Equation (2021)    Anion gap 10 5 - 15    Comment: Performed at [facility], [address] [place], [place] [address]  CBC     Status: Abnormal   Collection Time: 01/18/23  1:37 PM  Result Value Ref Range   WBC 12.2 (H) 4.0 - 10.5 K/uL   RBC 4.65 4.22 - 5.81 MIL/uL   Hemoglobin 14.0 13.0 - 17.0 g/dL   HCT 60.4 54.0 - 98.1 %   MCV 94.6 80.0 - 100.0 fL   MCH 30.1 26.0 - 34.0 pg   MCHC 31.8 30.0 - 36.0 g/dL   RDW 19.1 47.8 - 29.5 %   Platelets 186 150 - 400 K/uL   nRBC 0.0 0.0 - 0.2 %    Comment: Performed at [facility], [address] [place], [place] [address]  Urinalysis, Routine w reflex microscopic -Urine, Clean Catch     Status: Abnormal   Collection Time: 01/18/23  2:29 PM  Result Value Ref Range   Color, Urine YELLOW (A) YELLOW   APPearance CLEAR (A) CLEAR   Specific Gravity, Urine 1.013 1.005 - 1.030   pH 6.0 5.0 - 8.0   Glucose, UA NEGATIVE NEGATIVE mg/dL   Hgb urine dipstick NEGATIVE NEGATIVE   Bilirubin Urine NEGATIVE NEGATIVE   Ketones, ur NEGATIVE NEGATIVE mg/dL   Protein, ur NEGATIVE NEGATIVE mg/dL   Nitrite NEGATIVE NEGATIVE   Leukocytes,Ua NEGATIVE NEGATIVE    Comment: Performed at [facility], [address], [place], [place] [address]  Urine Drug Screen, Qualitative ([facility] only)     Status: None   Collection Time: 01/18/23  2:29 PM  Result Value Ref Range   Tricyclic, Ur Screen NONE DETECTED NONE  DETECTED   Amphetamines, Ur Screen NONE DETECTED NONE DETECTED   MDMA (Ecstasy)Ur Screen NONE DETECTED NONE DETECTED   Cocaine Metabolite,Ur [place] NONE DETECTED NONE DETECTED   Opiate, Ur Screen NONE DETECTED NONE DETECTED   Phencyclidine (PCP) Ur S NONE DETECTED NONE DETECTED   Cannabinoid 50 Ng, Ur [place] NONE DETECTED NONE DETECTED   Barbiturates, Ur Screen NONE DETECTED NONE DETECTED   Benzodiazepine, Ur Scrn NONE DETECTED NONE DETECTED   Methadone Scn, Ur NONE DETECTED NONE DETECTED    Comment: (NOTE) Tricyclics + metabolites, urine    Cutoff 1000 ng/mL Amphetamines + metabolites, urine  Cutoff 1000 ng/mL MDMA (Ecstasy), urine              Cutoff 500 ng/mL Cocaine Metabolite, urine          Cutoff 300 ng/mL Opiate + metabolites, urine        Cutoff 300 ng/mL Phencyclidine (PCP), urine         Cutoff 25 ng/mL Cannabinoid, urine                 Cutoff 50 ng/mL Barbiturates + metabolites, urine  Cutoff 200 ng/mL Benzodiazepine, urine              Cutoff 200 ng/mL Methadone, urine                   Cutoff 300 ng/mL  The urine drug screen provides only a preliminary, unconfirmed analytical test result and should not be used for non-medical purposes. Clinical consideration and professional judgment should be applied to any positive drug screen result due to possible interfering substances. A more specific alternate chemical method must be used in order to obtain a confirmed analytical result. Gas chromatography / mass  spectrometry (GC/MS) is the preferred confirm atory method. Performed at [facility], [address] [place], [place] [address]   Resp panel by RT-PCR (RSV, Flu A&B, Covid) Anterior Nasal Swab     Status: None   Collection Time: 01/18/23  5:30 PM   Specimen: Anterior Nasal Swab  Result Value Ref Range   SARS Coronavirus 2 by RT PCR NEGATIVE NEGATIVE    Comment: (NOTE) SARS-CoV-2 target nucleic acids are NOT DETECTED.  The SARS-CoV-2 RNA is generally  detectable in upper respiratory specimens during the acute phase of infection. The lowest concentration of SARS-CoV-2 viral copies this assay can detect is 138 copies/mL. A negative result does not preclude SARS-Cov-2 infection and should not be used as the sole basis for treatment or other patient management decisions. A negative result may occur with  improper specimen collection/handling, submission of specimen other than nasopharyngeal swab, presence of viral mutation(s) within the areas targeted by this assay, and inadequate number of viral copies(<138 copies/mL). A negative result must be combined with clinical observations, patient history, and epidemiological information. The expected result is Negative.  Fact Sheet for Patients:  BloggerCourse.com  Fact Sheet for Healthcare Providers:  SeriousBroker.it  This test is no t yet approved or cleared by the [place] FDA and  has been authorized for detection and/or diagnosis of SARS-CoV-2 by FDA under an Emergency Use Authorization (EUA). This EUA will remain  in effect (meaning this test can be used) for the duration of the COVID-19 declaration under Section 564(b)(1) of the Act, 21 U.S.C.section 360bbb-3(b)(1), unless the authorization is terminated  or revoked sooner.       Influenza A by PCR NEGATIVE NEGATIVE   Influenza B by PCR NEGATIVE NEGATIVE    Comment: (NOTE) The Xpert Xpress SARS-CoV-2/FLU/RSV plus assay is intended as an aid in the diagnosis of influenza from Nasopharyngeal swab specimens and should not be used as a sole basis for treatment. Nasal washings and aspirates are unacceptable for Xpert Xpress SARS-CoV-2/FLU/RSV testing.  Fact Sheet for Patients: BloggerCourse.com  Fact Sheet for Healthcare Providers: SeriousBroker.it  This test is not yet approved or cleared by the [place] FDA and has  been authorized for detection and/or diagnosis of SARS-CoV-2 by FDA under an Emergency Use Authorization (EUA). This EUA will remain in effect (meaning this test can be used) for the duration of the COVID-19 declaration under Section 564(b)(1) of the Act, 21 U.S.C. section 360bbb-3(b)(1), unless the authorization is terminated or revoked.     Resp Syncytial Virus by PCR NEGATIVE NEGATIVE    Comment: (NOTE) Fact Sheet for Patients: BloggerCourse.com  Fact Sheet for Healthcare Providers: SeriousBroker.it  This test is not yet approved or cleared by the [place] FDA and has been authorized for detection and/or diagnosis of SARS-CoV-2 by FDA under an Emergency Use Authorization (EUA). This EUA will remain in effect (meaning this test can be used) for the duration of the COVID-19 declaration under Section 564(b)(1) of the Act, 21 U.S.C. section 360bbb-3(b)(1), unless the authorization is terminated or revoked.  Performed at [facility], [address] [place], [place] [address]     Lab Results  Component Value Date   CREATININE 1.69 (H) 01/18/2023   CREATININE 1.52 (H) 04/20/2022   CREATININE 1.38 (H) 04/19/2022      Latest Ref Rng & Units 01/18/2023    1:37 PM 04/20/2022    5:24 AM 04/19/2022    5:31 AM  CMP  Glucose 70 - 99 mg/dL 098  85  95   BUN 8 - 23 mg/dL 25  28  26    Creatinine 0.61 - 1.24 mg/dL 1.61  0.96  0.45   Sodium 135 - 145 mmol/L 136  131  130   Potassium 3.5 - 5.1 mmol/L 4.2  4.4  4.2   Chloride 98 - 111 mmol/L 102  103  103   CO2 22 - 32 mmol/L 24  19  19    Calcium 8.9 - 10.3 mg/dL 40.9  8.7  8.4   Total Protein 6.5 - 8.1 g/dL 7.9  6.4  6.5   Total Bilirubin 0.3 - 1.2 mg/dL 1.2  1.7  2.2   Alkaline Phos 38 - 126 U/L 94  205  223   AST 15 - 41 U/L 38  47  75   ALT 0 - 44 U/L 20  127  171    Unresulted Labs (From admission, onward)    None      Pt has received : Orders Placed This  Encounter  Procedures  . Resp panel by RT-PCR (RSV, Flu A&B, Covid) Anterior Nasal Swab    Standing Status:   Standing    Number of Occurrences:   1  . CT Head Wo Contrast    Standing Status:   Standing    Number of Occurrences:   1  . DG Chest Portable 1 View    Standing Status:   Standing    Number of Occurrences:   1    Order Specific Question:   Reason for Exam (SYMPTOM  OR DIAGNOSIS REQUIRED)    Answer:   ams  . CT Head Wo Contrast    Standing Status:   Standing    Number of Occurrences:   1  . Comprehensive metabolic panel    Standing Status:   Standing    Number of Occurrences:   1  . CBC    Standing Status:   Standing    Number of Occurrences:   1  . Urinalysis, Routine w reflex microscopic -Urine, Clean Catch    Standing Status:   Standing    Number of Occurrences:   1    Order Specific Question:   Specimen Source    Answer:   Urine, Clean Catch [76]  . Urine Drug Screen, Qualitative ([facility] only)    Standing Status:   Standing    Number of Occurrences:   1  . Diet Heart Room service appropriate? Yes; Fluid consistency: Thin    Standing Status:   Standing    Number of Occurrences:   1    Order Specific Question:   Room service appropriate?    Answer:   Yes    Order Specific Question:   Fluid consistency:    Answer:   Thin  . Document Height and Actual Weight    Use scales to weigh patient, not stated or estimated weight.    Standing Status:   Standing    Number of Occurrences:   1  . Consult to hospitalist    Standing Status:   Standing    Number of Occurrences:   1    Order Specific Question:   Place call to:    Answer:   hospitalist    Order Specific Question:   Reason for Consult    Answer:   Admit    Order Specific Question:   Diagnosis/Clinical Info for Consult:    Answer:   ams, pna    Meds ordered this encounter  Medications  . sodium chloride 0.9 % bolus 1,000 mL  . LORazepam (ATIVAN) tablet 2 mg    Admission Imaging : CT Head Wo  Contrast  Result Date: 01/18/2023 CLINICAL DATA:  Follow-up possible subdural hematoma. EXAM: CT HEAD WITHOUT CONTRAST TECHNIQUE: Contiguous axial images were obtained from the base of the skull through the vertex without intravenous contrast. RADIATION DOSE REDUCTION: This exam was performed according to the departmental dose-optimization program which includes automated exposure control, adjustment of the mA and/or kV according to patient size and/or use of iterative reconstruction technique. COMPARISON:  Earlier today FINDINGS: Brain: Again seen is the abnormal extra-axial soft tissue in the anterior right frontal region. On sagittal imaging, this appears to represent an extra-axial mass currently measuring 3.2 x 1.8 cm compared to 3.4 x 1.9 cm, not significantly changed. Suggestion of peripheral calcification. No real change since prior study. There is atrophy and chronic small vessel disease changes. No hydrocephalus. Vascular: No hyperdense vessel or unexpected calcification. Skull: No acute calvarial abnormality. Sinuses/Orbits: Opacified left maxillary sinus, left frontal sinus and scattered ethmoid air cells. No change. 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 No change since prior study. Atrophy, chronic small vessel disease. Electronically Signed   By: Charlett Nose M.D.   On: 01/18/2023 21:38   DG Chest Portable 1 View  Result Date: 01/18/2023 CLINICAL DATA:  Altered mental status. EXAM: PORTABLE CHEST 1 VIEW COMPARISON:  04/16/2022. FINDINGS: Left chest dual-chamber pacemaker with leads projecting over the right atrium and ventricle. Basilar predominant reticular opacities may reflect aspiration, atypical/viral infection or mild interstitial pulmonary edema. Stable cardiac and mediastinal contours. No pleural effusion or pneumothorax. Visualized bones and upper abdomen  are unremarkable. IMPRESSION: Basilar predominant reticular opacities may reflect aspiration, atypical/viral infection or mild interstitial pulmonary edema. Electronically Signed   By: Orvan Falconer M.D.   On: 01/18/2023 17:19   CT Head Wo Contrast  Result Date: 01/18/2023 CLINICAL DATA:  Mental status change of unknown cause. EXAM: CT HEAD WITHOUT CONTRAST TECHNIQUE: Contiguous axial images were obtained from the base of the skull through the vertex without intravenous contrast. RADIATION DOSE REDUCTION: This exam was performed according to the departmental dose-optimization program which includes automated exposure control, adjustment of the mA and/or kV according to patient size and/or use of iterative reconstruction technique. COMPARISON:  None Available. FINDINGS: Brain: Focal area of increased attenuation along the anterior aspect of the right frontal lobe this may reflect an area of subdural hemorrhage. The appearance, however on the sagittal reconstructed image suggests that this may be an extra-axial mass measuring approximately 3.4 x 1.9 cm. Increased density may reflect areas of calcification. There is no other evidence of intracranial hemorrhage and no other evidence of a mass. Ventricles are normal in size and configuration for the patient's age. No evidence of an infarction. No midline shift. Mild periventricular white matter hypoattenuation noted consistent with chronic microvascular ischemic change. Vascular: No hyperdense vessel or unexpected calcification. Skull: Normal. Negative for fracture or focal lesion. Sinuses/Orbits: Globes and orbits are unremarkable. Left maxillary and frontal sinuses are opacified. Right frontal sinus mucosal thickening. Left anterior middle ethmoid sinus mucosal thickening and right anterior mucosal sinus mucosal thickening. Left mastoid air cell opacification. Other: None. IMPRESSION: 1. Possible focus of subdural hemorrhage along the anterior aspect of the right  frontal lobe versus a poorly defined extra-axial mass/meningioma. Recommend further assessment of this finding with MRI  without and with contrast. 2. No other evidence of an acute abnormality. No evidence of an infarct. Electronically Signed   By: Amie Portland M.D.   On: 01/18/2023 15:19   Physical Examination: Vitals:   01/18/23 1334 01/18/23 1335 01/18/23 1615 01/18/23 2200  BP: 113/61  134/89 (!) 144/80  Pulse: 70  94 74  Temp: 97.7 F (36.5 C)   98.5 F (36.9 C)  Resp: 20  20 16   Height:  5\' 7"  (1.702 m)    Weight:  95.7 kg    SpO2: 98%  92% 96%  TempSrc: Oral   Oral  BMI (Calculated):  33.04     Physical Exam  Assessment and Plan: No notes have been filed under this hospital service. Service: Hospitalist       DVT prophylaxis:  ***  Code Status:  ***     01/18/2023    1:37 PM  Advanced Directives  Does Patient Have a Medical Advance Directive? Yes  Type of Estate agent of [place];Living will;Out of facility DNR (pink MOST or yellow form)    Family Communication:  *** Emergency Contact: Contact Information     Name Relation Home Work Mobile   [place] Daughter [phone]     [place],Janine Daughter [phone]         Disposition Plan:  ***  Consults: *** Admission status: ***  Unit / Expected LOS: ***  Gertha Calkin MD [facility]  6 PM- 2 AM. [phone]( Pager )  For questions regarding this patient please use [email or web address] to contact the current [facility] MD.   Bonita Quin may also call [phone] to contact current Assigned [facility] Attending/Consulting MD for this patient.

## 2023-01-18 NOTE — ED Triage Notes (Signed)
First nurse note:Patient arrived by EMS from home. Family reports finding him wondering outside last night and this AM. EMS staff reports patient smells strong of urine  History pacemaker  EMS vitals: 115P 94% RA 37 124/70 b/p 97.3oral 126CBG

## 2023-01-18 NOTE — H&P (Addendum)
History and Physical     Patient: Samuel Mack:[id number] DOB: 19-Jan-1930 DOA: 01/18/2023 DOS: the patient was seen and examined on 01/19/2023 PCP: Sherrie Mustache, MD   Patient coming from: Home  Chief Complaint: Altered mental status   HISTORY OF PRESENT ILLNESS: Samuel Mack is an 89 y.o. male past medical history of dementia seen for Worsening agitation Today drastic change in behavior in AM.  Also with hallucinations  Family concerned for UTI.    Past Medical History:  Diagnosis Date   AKI (acute kidney injury) (HCC) 04/20/2022   Aortic atherosclerosis (HCC)    Asthma    Atrial fibrillation (HCC)    a.) CHA2DS2VASc = 5 (age x2, HTN, previous MI, T2DM);  b.) rate/rhythm maintained on oral diltiazem + nebivolol; no chronic anticoagulation   CHB (complete heart block) (HCC)    a.) PPM in place   Cholelithiasis    CKD (chronic kidney disease), stage III (HCC)    Cognitive impairment    Complication of anesthesia    a.) anesthesia awareness with previous PPM insertion   COPD (chronic obstructive pulmonary disease) (HCC)    Deafness in left ear    Decreased vision of left eye    Diet-controlled type 2 diabetes mellitus (HCC)    Dyspnea    Essential tremor    Hepatic steatosis    HLD (hyperlipidemia)    Hypertension    Hypothyroidism    Imbalance    Myocardial infarction (HCC)    Nausea and vomiting 08/27/2022   Presence of permanent cardiac pacemaker 12/06/1999   a.) [employer] device placed for SSS; b.) device failure - developed CHB --> device replaced 05/07/2006; c.) pulse generator changed 02/08/2015; d.) pulse generator changed 05/12/2019   Skin cancer    SSS (sick sinus syndrome) (HCC)    a.) s/p PPM placement   Tachy-brady syndrome (HCC)    Review of Systems  Unable to perform ROS: Acuity of condition   Allergies  Allergen Reactions   Bee Venom Swelling   Penicillins Swelling    Received 1st generation cephalosporin (CEFAZOLIN) on 05/12/2019 without  documented ADRs.   Patient reports he swells and it was "something in the pcn" 50 year ago   Past Surgical History:  Procedure Laterality Date   CATARACT EXTRACTION Left    CATARACT EXTRACTION W/PHACO Left 12/11/2016   Procedure: CATARACT EXTRACTION PHACO AND INTRAOCULAR LENS PLACEMENT (IOC);  Surgeon: Lockie Mola, MD;  Location: [facility];  Service: Ophthalmology;  Laterality: Left;  IVA TOPICAL LEFT   ERCP N/A 04/19/2022   Procedure: ENDOSCOPIC RETROGRADE CHOLANGIOPANCREATOGRAPHY (ERCP);  Surgeon: Midge Minium, MD;  Location: [facility];  Service: Endoscopy;  Laterality: N/A;   NASAL SINUS SURGERY     PACEMAKER GENERATOR CHANGE  05/07/2006   Procedure: PACEMAKER GENERATOR CHANGE; Location: [facility]; Surgeon: Corky Downs, MD   PACEMAKER GENERATOR CHANGE  02/08/2015   Procedure: PACEMAKER GENERATOR CHANGE; Location: [facility]; Surgeon: Corky Downs, MD   PACEMAKER INSERTION  12/06/1999   Procedure: PACEMAKER INSERTION; Location: [facility]; Surgeon: Corky Downs, MD   PACEMAKER INSERTION Left 05/12/2019   Procedure: INSERTION PACEMAKER, LEFT;  Surgeon: Corky Downs, MD;  Location: [facility];  Service: Cardiovascular;  Laterality: Left;   PROSTATE SURGERY     TRANSURETHRAL RESECTION OF PROSTATE N/A 06/20/2020   Procedure: TRANSURETHRAL RESECTION OF THE PROSTATE (TURP);  Surgeon: Riki Altes, MD;  Location: [facility];  Service: Urology;  Laterality: N/A;   MEDICATIONS: Prior to Admission medications   Medication Sig Start  Date End Date Taking? Authorizing Provider  aspirin 81 MG tablet Take 81 mg by mouth daily.   Yes [provider]  atorvastatin (LIPITOR) 10 MG tablet Take 10 mg by mouth at bedtime.   Yes [provider]  cholecalciferol (VITAMIN D) 25 MCG (1000 UNIT) tablet Take 1,000 Units by mouth in the morning and at bedtime.   Yes [provider]  Coenzyme Q10 100 MG TABS Take 100 mg by mouth daily.    Yes [provider]  digoxin  (LANOXIN) 0.125 MG tablet TAKE 1 TABLET BY MOUTH EVERY DAY 03/12/22  Yes Masoud, Renda Rolls, MD  diltiazem (CARDIZEM LA) 180 MG 24 hr tablet Take 180 mg by mouth daily. 01/14/23  Yes [provider]  levocetirizine (XYZAL) 5 MG tablet Take 5 mg by mouth every evening.   Yes [provider]  levothyroxine (SYNTHROID) 150 MCG tablet Take 150 mcg by mouth every morning. 12/02/22  Yes [provider]  losartan (COZAAR) 50 MG tablet Take 50 mg by mouth daily. 01/02/23  Yes [provider]  Multiple Vitamins-Minerals (PRESERVISION AREDS PO) Take by mouth 2 (two) times daily.   Yes [provider]  nebivolol (BYSTOLIC) 10 MG tablet Take 10 mg by mouth daily.   Yes [provider]  Omega-3 Fatty Acids (FISH OIL) 1200 MG CAPS Take 1,200 mg by mouth 3 (three) times daily.   Yes [provider]  potassium gluconate 595 (99 K) MG TABS tablet Take 595 mg by mouth daily.   Yes [provider]  spironolactone (ALDACTONE) 25 MG tablet Take 25 mg by mouth daily. 11/04/22  Yes [provider]  vitamin B-12 (CYANOCOBALAMIN) 1000 MCG tablet Take 1,000 mcg by mouth daily.   Yes [provider]  vitamin C (ASCORBIC ACID) 500 MG tablet Take 500 mg by mouth daily.   Yes [provider]  vitamin E 180 MG (400 UNITS) capsule Take 400 Units by mouth daily.   Yes [provider]   ED Course: Pt in Ed afebrile vital stable O2 sats above 90% on room air.  Vitals:   01/18/23 1335 01/18/23 1615 01/18/23 2200 01/19/23 0357  BP:  134/89 (!) 144/80 138/74  Pulse:  94 74 72  Resp:  20 16 18   Temp:   98.5 F (36.9 C) 98.4 F (36.9 C)  TempSrc:   Oral Oral  SpO2:  92% 96% 95%  Weight: 95.7 kg     Height: 5\' 7"  (1.702 m)      No intake/output data recorded. SpO2: 95 % Blood work in ed shows: CMP shows creatinine of 1.69 and calcium of 10.9. CBC shows normal hemoglobin of 14 white count of 12.2 normal platelets of 186.    Respiratory panel is negative for flu COVID and RSV. Initial head CT showed possible focus of subdural hemorrhage along the anterior aspect of the right frontal lobe versus hemangioma and case discussed by EDMD with neurosurgeon who recommended repeat CT based on which they feel it is a hemangioma) no additional recommendations as far as antiepileptic medications.  Neurosurgery will follow per EDMD.  Results for orders placed or performed during the hospital encounter of 01/18/23 (from the past 72 hour(s))  Comprehensive metabolic panel     Status: Abnormal   Collection Time: 01/18/23  1:37 PM  Result Value Ref Range   Sodium 136 135 - 145 mmol/L   Potassium 4.2 3.5 - 5.1 mmol/L   Chloride 102 98 - 111 mmol/L  CO2 24 22 - 32 mmol/L   Glucose, Bld 110 (H) 70 - 99 mg/dL    Comment: Glucose reference range applies only to samples taken after fasting for at least 8 hours.   BUN 25 (H) 8 - 23 mg/dL   Creatinine, Ser 1.61 (H) 0.61 - 1.24 mg/dL   Calcium 09.6 (H) 8.9 - 10.3 mg/dL   Total Protein 7.9 6.5 - 8.1 g/dL   Albumin 4.0 3.5 - 5.0 g/dL   AST 38 15 - 41 U/L   ALT 20 0 - 44 U/L   Alkaline Phosphatase 94 38 - 126 U/L   Total Bilirubin 1.2 0.3 - 1.2 mg/dL   GFR, Estimated 38 (L) >60 mL/min    Comment: (NOTE) Calculated using the CKD-EPI Creatinine Equation (2021)    Anion gap 10 5 - 15    Comment: Performed at [facility], [address] [place], [place] [address]  CBC     Status: Abnormal   Collection Time: 01/18/23  1:37 PM  Result Value Ref Range   WBC 12.2 (H) 4.0 - 10.5 K/uL   RBC 4.65 4.22 - 5.81 MIL/uL   Hemoglobin 14.0 13.0 - 17.0 g/dL   HCT 98.1 19.1 - 47.8 %   MCV 94.6 80.0 - 100.0 fL   MCH 30.1 26.0 - 34.0 pg   MCHC 31.8 30.0 - 36.0 g/dL   RDW 29.5 62.1 - 30.8 %   Platelets 186 150 - 400 K/uL   nRBC 0.0 0.0 - 0.2 %    Comment: Performed at [facility], [address] [place], [place] [address]  Urinalysis, Routine w reflex microscopic  -Urine, Clean Catch     Status: Abnormal   Collection Time: 01/18/23  2:29 PM  Result Value Ref Range   Color, Urine YELLOW (A) YELLOW   APPearance CLEAR (A) CLEAR   Specific Gravity, Urine 1.013 1.005 - 1.030   pH 6.0 5.0 - 8.0   Glucose, UA NEGATIVE NEGATIVE mg/dL   Hgb urine dipstick NEGATIVE NEGATIVE   Bilirubin Urine NEGATIVE NEGATIVE   Ketones, ur NEGATIVE NEGATIVE mg/dL   Protein, ur NEGATIVE NEGATIVE mg/dL   Nitrite NEGATIVE NEGATIVE   Leukocytes,Ua NEGATIVE NEGATIVE    Comment: Performed at [facility], [address] [place], [place] [address]  Urine Drug Screen, Qualitative ([facility] only)     Status: None   Collection Time: 01/18/23  2:29 PM  Result Value Ref Range   Tricyclic, Ur Screen NONE DETECTED NONE DETECTED   Amphetamines, Ur Screen NONE DETECTED NONE DETECTED   MDMA (Ecstasy)Ur Screen NONE DETECTED NONE DETECTED   Cocaine Metabolite,Ur Power NONE DETECTED NONE DETECTED   Opiate, Ur Screen NONE DETECTED NONE DETECTED   Phencyclidine (PCP) Ur S NONE DETECTED NONE DETECTED   Cannabinoid 50 Ng, Ur [place] NONE DETECTED NONE DETECTED   Barbiturates, Ur Screen NONE DETECTED NONE DETECTED   Benzodiazepine, Ur Scrn NONE DETECTED NONE DETECTED   Methadone Scn, Ur NONE DETECTED NONE DETECTED    Comment: (NOTE) Tricyclics + metabolites, urine    Cutoff 1000 ng/mL Amphetamines + metabolites, urine  Cutoff 1000 ng/mL MDMA (Ecstasy), urine              Cutoff 500 ng/mL Cocaine Metabolite, urine          Cutoff 300 ng/mL Opiate + metabolites, urine        Cutoff 300 ng/mL Phencyclidine (PCP), urine         Cutoff 25 ng/mL Cannabinoid,  urine                 Cutoff 50 ng/mL Barbiturates + metabolites, urine  Cutoff 200 ng/mL Benzodiazepine, urine              Cutoff 200 ng/mL Methadone, urine                   Cutoff 300 ng/mL  The urine drug screen provides only a preliminary, unconfirmed analytical test result and should not be used for non-medical purposes. Clinical  consideration and professional judgment should be applied to any positive drug screen result due to possible interfering substances. A more specific alternate chemical method must be used in order to obtain a confirmed analytical result. Gas chromatography / mass spectrometry (GC/MS) is the preferred confirm atory method. Performed at [facility], [address] [place], [place] [address]   Resp panel by RT-PCR (RSV, Flu A&B, Covid) Anterior Nasal Swab     Status: None   Collection Time: 01/18/23  5:30 PM   Specimen: Anterior Nasal Swab  Result Value Ref Range   SARS Coronavirus 2 by RT PCR NEGATIVE NEGATIVE    Comment: (NOTE) SARS-CoV-2 target nucleic acids are NOT DETECTED.  The SARS-CoV-2 RNA is generally detectable in upper respiratory specimens during the acute phase of infection. The lowest concentration of SARS-CoV-2 viral copies this assay can detect is 138 copies/mL. A negative result does not preclude SARS-Cov-2 infection and should not be used as the sole basis for treatment or other patient management decisions. A negative result may occur with  improper specimen collection/handling, submission of specimen other than nasopharyngeal swab, presence of viral mutation(s) within the areas targeted by this assay, and inadequate number of viral copies(<138 copies/mL). A negative result must be combined with clinical observations, patient history, and epidemiological information. The expected result is Negative.  Fact Sheet for Patients:  BloggerCourse.com  Fact Sheet for Healthcare Providers:  SeriousBroker.it  This test is no t yet approved or cleared by the [place] FDA and  has been authorized for detection and/or diagnosis of SARS-CoV-2 by FDA under an Emergency Use Authorization (EUA). This EUA will remain  in effect (meaning this test can be used) for the duration of the COVID-19 declaration under  Section 564(b)(1) of the Act, 21 U.S.C.section 360bbb-3(b)(1), unless the authorization is terminated  or revoked sooner.       Influenza A by PCR NEGATIVE NEGATIVE   Influenza B by PCR NEGATIVE NEGATIVE    Comment: (NOTE) The Xpert Xpress SARS-CoV-2/FLU/RSV plus assay is intended as an aid in the diagnosis of influenza from Nasopharyngeal swab specimens and should not be used as a sole basis for treatment. Nasal washings and aspirates are unacceptable for Xpert Xpress SARS-CoV-2/FLU/RSV testing.  Fact Sheet for Patients: BloggerCourse.com  Fact Sheet for Healthcare Providers: SeriousBroker.it  This test is not yet approved or cleared by the [place] FDA and has been authorized for detection and/or diagnosis of SARS-CoV-2 by FDA under an Emergency Use Authorization (EUA). This EUA will remain in effect (meaning this test can be used) for the duration of the COVID-19 declaration under Section 564(b)(1) of the Act, 21 U.S.C. section 360bbb-3(b)(1), unless the authorization is terminated or revoked.     Resp Syncytial Virus by PCR NEGATIVE NEGATIVE    Comment: (NOTE) Fact Sheet for Patients: BloggerCourse.com  Fact Sheet for Healthcare Providers: SeriousBroker.it  This test is not yet approved or cleared by the [place] FDA and has  been authorized for detection and/or diagnosis of SARS-CoV-2 by FDA under an Emergency Use Authorization (EUA). This EUA will remain in effect (meaning this test can be used) for the duration of the COVID-19 declaration under Section 564(b)(1) of the Act, 21 U.S.C. section 360bbb-3(b)(1), unless the authorization is terminated or revoked.  Performed at [facility], [address] [place], [place] [address]     Lab Results  Component Value Date   CREATININE 1.69 (H) 01/18/2023   CREATININE 1.52 (H) 04/20/2022   CREATININE  1.38 (H) 04/19/2022      Latest Ref Rng & Units 01/18/2023    1:37 PM 04/20/2022    5:24 AM 04/19/2022    5:31 AM  CMP  Glucose 70 - 99 mg/dL 191  85  95   BUN 8 - 23 mg/dL 25  28  26    Creatinine 0.61 - 1.24 mg/dL 4.78  2.95  6.21   Sodium 135 - 145 mmol/L 136  131  130   Potassium 3.5 - 5.1 mmol/L 4.2  4.4  4.2   Chloride 98 - 111 mmol/L 102  103  103   CO2 22 - 32 mmol/L 24  19  19    Calcium 8.9 - 10.3 mg/dL 30.8  8.7  8.4   Total Protein 6.5 - 8.1 g/dL 7.9  6.4  6.5   Total Bilirubin 0.3 - 1.2 mg/dL 1.2  1.7  2.2   Alkaline Phos 38 - 126 U/L 94  205  223   AST 15 - 41 U/L 38  47  75   ALT 0 - 44 U/L 20  127  171    Unresulted Labs (From admission, onward)    None      Pt has received : Orders Placed This Encounter  Procedures   Resp panel by RT-PCR (RSV, Flu A&B, Covid) Anterior Nasal Swab    Standing Status:   Standing    Number of Occurrences:   1   CT Head Wo Contrast    Standing Status:   Standing    Number of Occurrences:   1   DG Chest Portable 1 View    Standing Status:   Standing    Number of Occurrences:   1    Order Specific Question:   Reason for Exam (SYMPTOM  OR DIAGNOSIS REQUIRED)    Answer:   ams   CT Head Wo Contrast    Standing Status:   Standing    Number of Occurrences:   1   Comprehensive metabolic panel    Standing Status:   Standing    Number of Occurrences:   1   CBC    Standing Status:   Standing    Number of Occurrences:   1   Urinalysis, Routine w reflex microscopic -Urine, Clean Catch    Standing Status:   Standing    Number of Occurrences:   1    Order Specific Question:   Specimen Source    Answer:   Urine, Clean Catch [76]   Urine Drug Screen, Qualitative ([facility] only)    Standing Status:   Standing    Number of Occurrences:   1   Diet NPO time specified    Standing Status:   Standing    Number of Occurrences:   1   Document Height and Actual Weight    Use scales to weigh patient, not stated or estimated weight.    Standing  Status:   Standing  Number of Occurrences:   1   SCD's    Standing Status:   Standing    Number of Occurrences:   1    Order Specific Question:   Laterality    Answer:   Bilateral   Vital signs    Standing Status:   Standing    Number of Occurrences:   1   Notify physician (specify)    Standing Status:   Standing    Number of Occurrences:   20    Order Specific Question:   Notify Physician    Answer:   change in neurologic status based on Neurologic Worsening Protocol    Order Specific Question:   Notify Physician    Answer:   new onset dysrhythmia    Order Specific Question:   Notify Physician    Answer:   Systolic BP > 150 (not controlled by PRN continuous infusion medications) or less than 100. Goal SBP 130-150    Order Specific Question:   Notify Physician    Answer:   Pox < 88% despite the use of supplemental O2 via nasal cannula    Order Specific Question:   Notify Physician    Answer:   Heart Rate > 120 or less than 50    Order Specific Question:   Notify Physician    Answer:   Respiratory Rate > 30    Order Specific Question:   Notify Physician    Answer:   Blood Glucose > 180mg /dl or less than 60mg /dl    Order Specific Question:   Notify Physician    Answer:   Temperature goal of 37 C (98.6 F). Acceptable range: 36.5 C - 37.5 C (97.7 F - 99.5 F)    Order Specific Question:   Notify Physician    Answer:   Urine output > 300 ml in 1 hour or less than 30 ml in 4 hours (unless anuric)   Bed rest    Standing Status:   Standing    Number of Occurrences:   1   Elevate head of bed    Standing Status:   Standing    Number of Occurrences:   1    Order Specific Question:   Number of degrees    Answer:   30 Degrees   Swallow screen - If patient does NOT pass this screen, place order for SLP eval and treat (SLP2) - swallowing evaluation (BSE, MBS and/or diet order as indicated)    - If patient does NOT pass this screen, place order for SLP eval and treat (SLP2) - swallowing  evaluation (BSE, MBS and/or diet order as indicated)    Standing Status:   Standing    Number of Occurrences:   1   RN to notify Physician for appropriate diet after patient passes swallow screen    after patient passes swallow screen    Standing Status:   Standing    Number of Occurrences:   1   NIH stroke scale    On arrival to unit, then q 1h    Standing Status:   Standing    Number of Occurrences:   1   Intake and output    Avoid use of indwelling catheter    Standing Status:   Standing    Number of Occurrences:   1   Apply Stroke Care Plan: Intracerebral Hemorrhage, Nontraumatic    Standing Status:   Standing    Number of Occurrences:   1   Discuss with patient  and document patient's goals for stroke risk factor reduction    Standing Status:   Standing    Number of Occurrences:   1   Initiate Oral Care Protocol    Standing Status:   Standing    Number of Occurrences:   1   Initiate Carrier Fluid Protocol    Standing Status:   Standing    Number of Occurrences:   1   Provide stroke education material to patient and family    Standing Status:   Standing    Number of Occurrences:   1   Nurse to provide smoking / tobacco cessation education    Standing Status:   Standing    Number of Occurrences:   1   Interrogate Pacemaker if present    Standing Status:   Standing    Number of Occurrences:   1   Full code    Standing Status:   Standing    Number of Occurrences:   1    Order Specific Question:   By:    Answer:   Other   Consult to hospitalist    Standing Status:   Standing    Number of Occurrences:   1    Order Specific Question:   Place call to:    Answer:   hospitalist    Order Specific Question:   Reason for Consult    Answer:   Admit    Order Specific Question:   Diagnosis/Clinical Info for Consult:    Answer:   ams, pna   OT eval and treat    Standing Status:   Standing    Number of Occurrences:   1   PT eval and treat    Standing Status:   Standing     Number of Occurrences:   1   Pulse oximetry, continuous    Standing Status:   Standing    Number of Occurrences:   1   Oxygen therapy Mode or (Route): Nasal cannula; Liters Per Minute: 2; Keep 02 saturation: > 92%    Standing Status:   Standing    Number of Occurrences:   1    Order Specific Question:   Mode or (Route)    Answer:   Nasal cannula    Order Specific Question:   Liters Per Minute    Answer:   2    Order Specific Question:   Keep 02 saturation    Answer:   > 92%   SLP eval and treat Reason for evaluation: Cognitive/Language evaluation    Standing Status:   Standing    Number of Occurrences:   1    Order Specific Question:   Reason for evaluation    Answer:   Cognitive/Language evaluation   EKG 12-Lead    Standing Status:   Standing    Number of Occurrences:   1   Place in observation (patient's expected length of stay will be less than 2 midnights)    Standing Status:   Standing    Number of Occurrences:   1    Order Specific Question:   Hospital Area    Answer:   [facility] [[id number]]    Order Specific Question:   Level of Care    Answer:   Progressive [102]    Order Specific Question:   Admit to Progressive based on following criteria    Answer:   NEUROLOGICAL AND NEUROSURGICAL complex patients with significant risk of instability, who do not meet  ICU criteria, yet require close observation or frequent assessment (< / = every 2 - 4 hours) with medical / nursing intervention.    Order Specific Question:   Admit to Progressive based on following criteria    Answer:   CARDIOVASCULAR & THORACIC of moderate stability with acute coronary syndrome symptoms/low risk myocardial infarction/hypertensive urgency/arrhythmias/heart failure potentially compromising stability and stable post cardiovascular intervention patients.    Order Specific Question:   Covid Evaluation    Answer:   Asymptomatic - no recent exposure (last 10 days) testing not required    Order  Specific Question:   Diagnosis    Answer:   AMS (altered mental status) [[id number]]    Order Specific Question:   Admitting Physician    Answer:   Darrold Junker    Order Specific Question:   Attending Physician    Answer:   Darrold Junker   Fall precautions    Standing Status:   Standing    Number of Occurrences:   1   Seizure precautions    Standing Status:   Standing    Number of Occurrences:   1   Aspiration precautions    Standing Status:   Standing    Number of Occurrences:   1    Meds ordered this encounter  Medications   sodium chloride 0.9 % bolus 1,000 mL   LORazepam (ATIVAN) tablet 2 mg    stroke: early stages of recovery book   OR Linked Order Group    acetaminophen (TYLENOL) tablet 650 mg    acetaminophen (TYLENOL) 160 MG/5ML solution 650 mg    acetaminophen (TYLENOL) suppository 650 mg   pantoprazole (PROTONIX) injection 40 mg    Admission Imaging : CT Head Wo Contrast  Result Date: 01/18/2023 CLINICAL DATA:  Follow-up possible subdural hematoma. EXAM: CT HEAD WITHOUT CONTRAST TECHNIQUE: Contiguous axial images were obtained from the base of the skull through the vertex without intravenous contrast. RADIATION DOSE REDUCTION: This exam was performed according to the departmental dose-optimization program which includes automated exposure control, adjustment of the mA and/or kV according to patient size and/or use of iterative reconstruction technique. COMPARISON:  Earlier today FINDINGS: Brain: Again seen is the abnormal extra-axial soft tissue in the anterior right frontal region. On sagittal imaging, this appears to represent an extra-axial mass currently measuring 3.2 x 1.8 cm compared to 3.4 x 1.9 cm, not significantly changed. Suggestion of peripheral calcification. No real change since prior study. There is atrophy and chronic small vessel disease changes. No hydrocephalus. Vascular: No hyperdense vessel or unexpected calcification. Skull: No acute  calvarial abnormality. Sinuses/Orbits: Opacified left maxillary sinus, left frontal sinus and scattered ethmoid air cells. No change. 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 No change since prior study. Atrophy, chronic small vessel disease. Electronically Signed   By: Charlett Nose M.D.   On: 01/18/2023 21:38   DG Chest Portable 1 View  Result Date: 01/18/2023 CLINICAL DATA:  Altered mental status. EXAM: PORTABLE CHEST 1 VIEW COMPARISON:  04/16/2022. FINDINGS: Left chest dual-chamber pacemaker with leads projecting over the right atrium and ventricle. Basilar predominant reticular opacities may reflect aspiration, atypical/viral infection or mild interstitial pulmonary edema. Stable cardiac and mediastinal contours. No pleural effusion or pneumothorax. Visualized bones and upper abdomen are unremarkable. IMPRESSION: Basilar predominant reticular opacities may reflect aspiration, atypical/viral infection or mild interstitial pulmonary edema. Electronically Signed  By: Orvan Falconer M.D.   On: 01/18/2023 17:19   CT Head Wo Contrast  Result Date: 01/18/2023 CLINICAL DATA:  Mental status change of unknown cause. EXAM: CT HEAD WITHOUT CONTRAST TECHNIQUE: Contiguous axial images were obtained from the base of the skull through the vertex without intravenous contrast. RADIATION DOSE REDUCTION: This exam was performed according to the departmental dose-optimization program which includes automated exposure control, adjustment of the mA and/or kV according to patient size and/or use of iterative reconstruction technique. COMPARISON:  None Available. FINDINGS: Brain: Focal area of increased attenuation along the anterior aspect of the right frontal lobe this may reflect an area of subdural hemorrhage. The appearance, however on the sagittal reconstructed image suggests that this may be  an extra-axial mass measuring approximately 3.4 x 1.9 cm. Increased density may reflect areas of calcification. There is no other evidence of intracranial hemorrhage and no other evidence of a mass. Ventricles are normal in size and configuration for the patient's age. No evidence of an infarction. No midline shift. Mild periventricular white matter hypoattenuation noted consistent with chronic microvascular ischemic change. Vascular: No hyperdense vessel or unexpected calcification. Skull: Normal. Negative for fracture or focal lesion. Sinuses/Orbits: Globes and orbits are unremarkable. Left maxillary and frontal sinuses are opacified. Right frontal sinus mucosal thickening. Left anterior middle ethmoid sinus mucosal thickening and right anterior mucosal sinus mucosal thickening. Left mastoid air cell opacification. Other: None. IMPRESSION: 1. Possible focus of subdural hemorrhage along the anterior aspect of the right frontal lobe versus a poorly defined extra-axial mass/meningioma. Recommend further assessment of this finding with MRI without and with contrast. 2. No other evidence of an acute abnormality. No evidence of an infarct. Electronically Signed   By: Amie Portland M.D.   On: 01/18/2023 15:19   Physical Examination: Vitals:   01/18/23 1335 01/18/23 1615 01/18/23 2200 01/19/23 0357  BP:  134/89 (!) 144/80 138/74  Pulse:  94 74 72  Temp:   98.5 F (36.9 C) 98.4 F (36.9 C)  Resp:  20 16 18   Height: 5\' 7"  (1.702 m)     Weight: 95.7 kg     SpO2:  92% 96% 95%  TempSrc:   Oral Oral  BMI (Calculated): 33.04      Physical Exam Vitals and nursing note reviewed.  Constitutional:      General: He is not in acute distress.    Appearance: Normal appearance. He is not ill-appearing, toxic-appearing or diaphoretic.  HENT:     Head: Normocephalic.     Right Ear: Hearing and external ear normal.     Left Ear: Hearing and external ear normal.     Nose: Nose normal. No nasal deformity.      Mouth/Throat:     Lips: Pink.     Mouth: Mucous membranes are moist.     Tongue: No lesions.     Pharynx: Oropharynx is clear.  Eyes:     Extraocular Movements: Extraocular movements intact.     Pupils: Pupils are equal, round, and reactive to light.  Cardiovascular:     Rate and Rhythm: Normal rate and regular rhythm.     Pulses: Normal pulses.     Heart sounds: Normal heart sounds.  Pulmonary:     Effort: Pulmonary effort is normal.     Breath sounds: Normal breath sounds.  Abdominal:     General: There is no distension.     Palpations: Abdomen is soft. There is no mass.     Tenderness:  There is no abdominal tenderness. There is no guarding.  Musculoskeletal:     Right lower leg: No edema.     Left lower leg: No edema.       Feet:  Feet:     Comments: RASH AROUND RT ANKLE INNER SIDE.  Skin:    General: Skin is warm.  Neurological:     General: No focal deficit present.     Mental Status: He is confused.     Cranial Nerves: Cranial nerves 2-12 are intact.     Motor: Motor function is intact.  Psychiatric:        Speech: He is noncommunicative.        Behavior: Behavior is hyperactive. Behavior is cooperative.     Assessment and Plan: * AMS (altered mental status) Initial CT findings were concerning for subdural hemorrhage. MRI of the brain contraindicated secondary to pacemaker.  And repeat CT showed more likely a hemangioma. Differentials include dementia sundowning. Will admit patient to monitor observe and neurosurgery to follow.   Hypothyroidism Again a.m. team to resume levothyroxine if patient is able to pass swallow test tomorrow morning.  Essential hypertension Vitals:   01/18/23 1334 01/18/23 1615 01/18/23 2200 01/19/23 0357  BP: 113/61 134/89 (!) 144/80 138/74  Currently patient is confused cannot take in a p.o. meds.  Will defer to a.m. team to assess mental status and swallow eval of and proceed with medications for hypertension.  Also currently blood  pressures are soft suspect will need discontinuation as medication and reduction at least.   Stage 3b chronic kidney disease (CKD) (HCC) Lab Results  Component Value Date   CREATININE 1.69 (H) 01/18/2023   CREATININE 1.52 (H) 04/20/2022   CREATININE 1.38 (H) 04/19/2022  Mild worsening of creatinine.  Baseline seems to be around 1.5. Will monitor and avoid contrast studies and renally dose medications.   Pacemaker Will admit to telemetry. EKG ordered/.  Interrogation  [employer] just called, they said that the device is functioning properly, and that lead measurements and battery are within expected range. She went on to say that the device was last interrogated and cleared on 3/18, and since that time there have been no arrhythmias. She is faxing over the results.    DVT prophylaxis:  None.  Pt is restless and pulls at diaper.  Code Status:  Full Code.       01/18/2023    1:37 PM  Advanced Directives  Does Patient Have a Medical Advance Directive? Yes  Type of Estate agent of [place];Living will;Out of facility DNR (pink MOST or yellow form)    Family Communication:  None at bedside.   Emergency Contact: Contact Information     Name Relation Home Work Mobile   [place] Daughter [phone]     Jul[place] Payer Daughter [phone]         Disposition Plan:   TBD.  Consults: None.  Admission status: Inpatient.   Unit / Expected LOS: Med tele/ 2 days.   Gertha Calkin MD [facility]  6 PM- 2 AM. [phone]( Pager )  For questions regarding this patient please use [email or web address] to contact the current [facility] MD.   Bonita Quin may also call [phone] to contact current Assigned [facility] Attending/Consulting MD for this patient.

## 2023-01-19 ENCOUNTER — Encounter: Payer: Self-pay | Admitting: Internal Medicine

## 2023-01-19 DIAGNOSIS — N1832 Chronic kidney disease, stage 3b: Secondary | ICD-10-CM | POA: Diagnosis present

## 2023-01-19 DIAGNOSIS — J189 Pneumonia, unspecified organism: Secondary | ICD-10-CM | POA: Diagnosis present

## 2023-01-19 DIAGNOSIS — Z6830 Body mass index (BMI) 30.0-30.9, adult: Secondary | ICD-10-CM | POA: Diagnosis not present

## 2023-01-19 DIAGNOSIS — Z66 Do not resuscitate: Secondary | ICD-10-CM | POA: Diagnosis present

## 2023-01-19 DIAGNOSIS — K760 Fatty (change of) liver, not elsewhere classified: Secondary | ICD-10-CM | POA: Diagnosis present

## 2023-01-19 DIAGNOSIS — Z950 Presence of cardiac pacemaker: Secondary | ICD-10-CM | POA: Diagnosis not present

## 2023-01-19 DIAGNOSIS — D329 Benign neoplasm of meninges, unspecified: Secondary | ICD-10-CM

## 2023-01-19 DIAGNOSIS — E663 Overweight: Secondary | ICD-10-CM | POA: Diagnosis present

## 2023-01-19 DIAGNOSIS — E1122 Type 2 diabetes mellitus with diabetic chronic kidney disease: Secondary | ICD-10-CM | POA: Diagnosis present

## 2023-01-19 DIAGNOSIS — F0392 Unspecified dementia, unspecified severity, with psychotic disturbance: Secondary | ICD-10-CM | POA: Diagnosis present

## 2023-01-19 DIAGNOSIS — I495 Sick sinus syndrome: Secondary | ICD-10-CM | POA: Diagnosis present

## 2023-01-19 DIAGNOSIS — J690 Pneumonitis due to inhalation of food and vomit: Secondary | ICD-10-CM | POA: Diagnosis present

## 2023-01-19 DIAGNOSIS — Z961 Presence of intraocular lens: Secondary | ICD-10-CM | POA: Diagnosis present

## 2023-01-19 DIAGNOSIS — H547 Unspecified visual loss: Secondary | ICD-10-CM | POA: Diagnosis present

## 2023-01-19 DIAGNOSIS — I4891 Unspecified atrial fibrillation: Secondary | ICD-10-CM | POA: Diagnosis present

## 2023-01-19 DIAGNOSIS — I129 Hypertensive chronic kidney disease with stage 1 through stage 4 chronic kidney disease, or unspecified chronic kidney disease: Secondary | ICD-10-CM | POA: Diagnosis present

## 2023-01-19 DIAGNOSIS — R339 Retention of urine, unspecified: Secondary | ICD-10-CM | POA: Diagnosis present

## 2023-01-19 DIAGNOSIS — J4489 Other specified chronic obstructive pulmonary disease: Secondary | ICD-10-CM | POA: Diagnosis present

## 2023-01-19 DIAGNOSIS — Z515 Encounter for palliative care: Secondary | ICD-10-CM | POA: Diagnosis not present

## 2023-01-19 DIAGNOSIS — R4182 Altered mental status, unspecified: Secondary | ICD-10-CM

## 2023-01-19 DIAGNOSIS — F03C11 Unspecified dementia, severe, with agitation: Secondary | ICD-10-CM | POA: Diagnosis not present

## 2023-01-19 DIAGNOSIS — H9192 Unspecified hearing loss, left ear: Secondary | ICD-10-CM | POA: Diagnosis present

## 2023-01-19 DIAGNOSIS — F03911 Unspecified dementia, unspecified severity, with agitation: Secondary | ICD-10-CM | POA: Diagnosis present

## 2023-01-19 DIAGNOSIS — F05 Delirium due to known physiological condition: Secondary | ICD-10-CM

## 2023-01-19 DIAGNOSIS — I10 Essential (primary) hypertension: Secondary | ICD-10-CM | POA: Diagnosis not present

## 2023-01-19 DIAGNOSIS — E039 Hypothyroidism, unspecified: Secondary | ICD-10-CM | POA: Diagnosis present

## 2023-01-19 DIAGNOSIS — G250 Essential tremor: Secondary | ICD-10-CM | POA: Diagnosis present

## 2023-01-19 DIAGNOSIS — Z1152 Encounter for screening for COVID-19: Secondary | ICD-10-CM | POA: Diagnosis not present

## 2023-01-19 DIAGNOSIS — E785 Hyperlipidemia, unspecified: Secondary | ICD-10-CM | POA: Diagnosis present

## 2023-01-19 LAB — BASIC METABOLIC PANEL
Anion gap: 8 (ref 5–15)
BUN: 20 mg/dL (ref 8–23)
CO2: 24 mmol/L (ref 22–32)
Calcium: 10.3 mg/dL (ref 8.9–10.3)
Chloride: 103 mmol/L (ref 98–111)
Creatinine, Ser: 1.34 mg/dL — ABNORMAL HIGH (ref 0.61–1.24)
GFR, Estimated: 50 mL/min — ABNORMAL LOW (ref >60)
Glucose, Bld: 94 mg/dL (ref 70–99)
Potassium: 3.5 mmol/L (ref 3.5–5.1)
Sodium: 135 mmol/L (ref 135–145)

## 2023-01-19 LAB — PROCALCITONIN: Procalcitonin: <0.1 ng/mL

## 2023-01-19 LAB — CBC WITH DIFFERENTIAL/PLATELET
Abs Immature Granulocytes: 0.05 10*3/uL (ref 0.00–0.07)
Basophils Absolute: 0.1 10*3/uL (ref 0.0–0.1)
Basophils Relative: 1 %
Eosinophils Absolute: 0.5 10*3/uL (ref 0.0–0.5)
Eosinophils Relative: 4 %
HCT: 39.5 % (ref 39.0–52.0)
Hemoglobin: 13 g/dL (ref 13.0–17.0)
Immature Granulocytes: 0 %
Lymphocytes Relative: 37 %
Lymphs Abs: 4.7 10*3/uL — ABNORMAL HIGH (ref 0.7–4.0)
MCH: 31 pg (ref 26.0–34.0)
MCHC: 32.9 g/dL (ref 30.0–36.0)
MCV: 94 fL (ref 80.0–100.0)
Monocytes Absolute: 0.9 10*3/uL (ref 0.1–1.0)
Monocytes Relative: 7 %
Neutro Abs: 6.4 10*3/uL (ref 1.7–7.7)
Neutrophils Relative %: 51 %
Platelets: 172 10*3/uL (ref 150–400)
RBC: 4.2 MIL/uL — ABNORMAL LOW (ref 4.22–5.81)
RDW: 14 % (ref 11.5–15.5)
WBC: 12.6 10*3/uL — ABNORMAL HIGH (ref 4.0–10.5)
nRBC: 0 % (ref 0.0–0.2)

## 2023-01-19 LAB — DIGOXIN LEVEL: Digoxin Level: 0.3 ng/mL — ABNORMAL LOW (ref 0.8–2.0)

## 2023-01-19 MED ORDER — VITAMIN B-12 1000 MCG PO TABS
1000.0000 ug | ORAL_TABLET | Freq: Every day | ORAL | Status: DC
  Filled 2023-01-19 (×2): qty 1

## 2023-01-19 MED ORDER — VITAMIN D 25 MCG (1000 UNIT) PO TABS
1000.0000 [IU] | ORAL_TABLET | Freq: Every day | ORAL | Status: DC
  Filled 2023-01-19 (×2): qty 1

## 2023-01-19 MED ORDER — PANTOPRAZOLE SODIUM 40 MG IV SOLR
40.0000 mg | Freq: Every day | INTRAVENOUS | Status: DC
  Administered 2023-01-19 – 2023-01-22 (×4): 40 mg via INTRAVENOUS
  Filled 2023-01-19 (×4): qty 10

## 2023-01-19 MED ORDER — VITAMIN C 500 MG PO TABS
500.0000 mg | ORAL_TABLET | Freq: Every day | ORAL | Status: DC
  Filled 2023-01-19 (×2): qty 1

## 2023-01-19 MED ORDER — STROKE: EARLY STAGES OF RECOVERY BOOK: Freq: Once | Status: AC

## 2023-01-19 MED ORDER — ENOXAPARIN SODIUM 40 MG/0.4ML IJ SOSY
40.0000 mg | PREFILLED_SYRINGE | INTRAMUSCULAR | Status: DC
  Administered 2023-01-19 – 2023-01-21 (×3): 40 mg via SUBCUTANEOUS
  Filled 2023-01-19 (×3): qty 0.4

## 2023-01-19 MED ORDER — OCUVITE-LUTEIN PO CAPS
1.0000 | ORAL_CAPSULE | Freq: Two times a day (BID) | ORAL | Status: DC
  Filled 2023-01-19 (×5): qty 1

## 2023-01-19 MED ORDER — OLANZAPINE 10 MG IM SOLR
5.0000 mg | Freq: Once | INTRAMUSCULAR | Status: AC
  Administered 2023-01-19: 5 mg via INTRAMUSCULAR
  Filled 2023-01-19: qty 10

## 2023-01-19 MED ORDER — LEVOTHYROXINE SODIUM 50 MCG PO TABS
150.0000 ug | ORAL_TABLET | Freq: Every morning | ORAL | Status: DC
  Administered 2023-01-21 – 2023-01-23 (×2): 150 ug via ORAL
  Filled 2023-01-19 (×2): qty 1

## 2023-01-19 MED ORDER — ACETAMINOPHEN 160 MG/5ML PO SOLN: 650.0000 mg | ORAL | Status: DC | PRN

## 2023-01-19 MED ORDER — DILTIAZEM HCL ER 180 MG PO TB24
180.0000 mg | ORAL_TABLET | Freq: Every day | ORAL | Status: DC
  Filled 2023-01-19 (×5): qty 1

## 2023-01-19 MED ORDER — ACETAMINOPHEN 325 MG PO TABS: 650.0000 mg | ORAL_TABLET | ORAL | Status: DC | PRN

## 2023-01-19 MED ORDER — ASPIRIN 81 MG PO TBEC
81.0000 mg | DELAYED_RELEASE_TABLET | Freq: Every day | ORAL | Status: DC
  Filled 2023-01-19 (×2): qty 1

## 2023-01-19 MED ORDER — HALOPERIDOL LACTATE 5 MG/ML IJ SOLN
2.0000 mg | Freq: Four times a day (QID) | INTRAMUSCULAR | Status: DC | PRN
  Administered 2023-01-19 – 2023-01-22 (×4): 2 mg via INTRAVENOUS
  Filled 2023-01-19 (×4): qty 1

## 2023-01-19 MED ORDER — NEBIVOLOL HCL 10 MG PO TABS
10.0000 mg | ORAL_TABLET | Freq: Every day | ORAL | Status: DC
  Filled 2023-01-19 (×6): qty 1

## 2023-01-19 MED ORDER — SODIUM CHLORIDE 0.9 % IV SOLN
500.0000 mg | INTRAVENOUS | Status: DC
  Administered 2023-01-19 – 2023-01-21 (×3): 500 mg via INTRAVENOUS
  Filled 2023-01-19 (×2): qty 500
  Filled 2023-01-19: qty 5

## 2023-01-19 MED ORDER — SPIRONOLACTONE 25 MG PO TABS
25.0000 mg | ORAL_TABLET | Freq: Every day | ORAL | Status: DC
  Filled 2023-01-19 (×2): qty 1

## 2023-01-19 MED ORDER — ATORVASTATIN CALCIUM 10 MG PO TABS
10.0000 mg | ORAL_TABLET | Freq: Every day | ORAL | Status: DC
  Administered 2023-01-20 – 2023-01-22 (×2): 10 mg via ORAL
  Filled 2023-01-19 (×5): qty 1

## 2023-01-19 MED ORDER — SODIUM CHLORIDE 0.9 % IV SOLN
2.0000 g | INTRAVENOUS | Status: AC
  Administered 2023-01-19 – 2023-01-23 (×5): 2 g via INTRAVENOUS
  Filled 2023-01-19 (×3): qty 2
  Filled 2023-01-19: qty 20
  Filled 2023-01-19: qty 2

## 2023-01-19 MED ORDER — ACETAMINOPHEN 650 MG RE SUPP: 650.0000 mg | RECTAL | Status: DC | PRN

## 2023-01-19 NOTE — Assessment & Plan Note (Signed)
Lab Results  Component Value Date   CREATININE 1.69 (H) 01/18/2023   CREATININE 1.52 (H) 04/20/2022   CREATININE 1.38 (H) 04/19/2022  Mild worsening of creatinine.  Baseline seems to be around 1.5. Will monitor and avoid contrast studies and renally dose medications.

## 2023-01-19 NOTE — ED Notes (Signed)
This tech put gown on pt, tied it at the top, pt kept on for 2  min then slid arms out and slid the gown off over his head.

## 2023-01-19 NOTE — ED Notes (Addendum)
This NT asked Idalia Needle, NT to come into pt room to ask her about pt hearing aids, one of the the hearing aids were missing so this NT inquired with Idalia Needle, NT since she was the one to remove th hearing aids from pt.   Pt keeps attempting to remove pads from the side of the stretcher. NT Idalia Needle was still in the room at this time and redirected pt and informed pt that pads were on the sides so that he doesn't put his legs through the bars and hurt himself. Pt stated to NT "if you don't let go I will reach over and beat the snot out of you". NT continued to replace pads on stretcher, pt attempted to swing at [place], [place] and grabbed her hand. Idalia Needle, NT verbally deescalated patient's frustration.

## 2023-01-19 NOTE — ED Notes (Signed)
Pt urinated in brief. Brief changed by this tech and Shanda Bumps, Charity fundraiser. New chux placed as well.

## 2023-01-19 NOTE — ED Notes (Signed)
Pt pulled down his brief in the front and urinated. Peri care performed, dry brief placed on pt with the help of Eliberto Ivory, Charity fundraiser. Pt resting comfortably.

## 2023-01-19 NOTE — ED Notes (Addendum)
This tech as 1:1 sitter for pt at this time. Pt is occasionally mumbling incoherent words, and is having brief periods of rest. Pt attempting to pull pads off of side rails. This tech is placing pads back on after pt does this. Pt also keep removing the blankets off of himself. Pt is only in a brief at this time, overnight sitter, that this tech took report from, stated pt would not keep gown on. Pt is telling this tech "I dont want that on the bed" when this tech tries to place blankets on pt. This tech placed gown on pt, pt instantly removes it. Pt is also kicking his right leg over the bed rail to try to get out of th bed and is asking this tech to let him out.

## 2023-01-19 NOTE — Assessment & Plan Note (Addendum)
Will admit to telemetry. EKG ordered/.  Interrogation  [employer] just called, they said that the device is functioning properly, and that lead measurements and battery are within expected range. She went on to say that the device was last interrogated and cleared on 3/18, and since that time there have been no arrhythmias. She is faxing over the results.

## 2023-01-19 NOTE — ED Notes (Addendum)
Pt having incidents of no respirations that last about 6 to 10 seconds of no rise and fall of the chest then breathing rhythm returns with heavy labored breathing. This repeats about every 5 minutes. Richard, RN notified. This NT placed spO2 sensor placed on pt right ear lobe, o2 is 100 pulse is 70.

## 2023-01-19 NOTE — ED Notes (Signed)
Swayze, MD, made aware of pt's agitation.

## 2023-01-19 NOTE — ED Notes (Addendum)
Pt became physically aggressive, attempting to punch and kick Paige, NT and stated that he would do the same to this NT and attempted the same. Richard, RN entered the room to give pt injection, while this NT and  Paige, NT kept pt arms away from injection site. Pt began yelling when receiving injection. Alex, RN entered the room to help deescalate pt, upon him standing close to pt feet, pt attempted kicking same RN in the chest. Pt continued to communicate "Im gonna sue all of you" "help me get out of here". Alex RN, Richard RN, Paige NT and this NT at pt bedside until pt fell asleep.

## 2023-01-19 NOTE — Consult Note (Signed)
Consult requested by:  Dr. Derrill Kay  Consult requested for:  Extraaxial lesion  Primary Physician:  Sherrie Mustache, MD  History of Present Illness: 01/19/2023 Samuel Mack is here today with a chief complaint of altered mental status.  The patient is unable to give me history.  He apparently has an underlying diagnosis of dementia but has been worsening per his family over the past month or so.  He was brought in today for a change in behavior.  He was hallucinating.  He is unable to answer questions for me.  He denies that he has a headache.  He begins telling the story regarding the recent move, but is not redirectable.  I have utilized the care everywhere function in epic to review the outside records available from external health systems.  Review of Systems:  Unable to obtain  Past Medical History: Past Medical History:  Diagnosis Date   AKI (acute kidney injury) (HCC) 04/20/2022   Aortic atherosclerosis (HCC)    Asthma    Atrial fibrillation (HCC)    a.) CHA2DS2VASc = 5 (age x2, HTN, previous MI, T2DM);  b.) rate/rhythm maintained on oral diltiazem + nebivolol; no chronic anticoagulation   CHB (complete heart block) (HCC)    a.) PPM in place   Cholelithiasis    CKD (chronic kidney disease), stage III (HCC)    Cognitive impairment    Complication of anesthesia    a.) anesthesia awareness with previous PPM insertion   COPD (chronic obstructive pulmonary disease) (HCC)    Deafness in left ear    Decreased vision of left eye    Diet-controlled type 2 diabetes mellitus (HCC)    Dyspnea    Essential tremor    Hepatic steatosis    HLD (hyperlipidemia)    Hypertension    Hypothyroidism    Imbalance    Myocardial infarction (HCC)    Nausea and vomiting 08/27/2022   Presence of permanent cardiac pacemaker 12/06/1999   a.) [employer] device placed for SSS; b.) device failure - developed CHB --> device replaced 05/07/2006; c.) pulse generator changed 02/08/2015; d.)  pulse generator changed 05/12/2019   Skin cancer    SSS (sick sinus syndrome) (HCC)    a.) s/p PPM placement   Tachy-brady syndrome [facility])     Past Surgical History: Past Surgical History:  Procedure Laterality Date   CATARACT EXTRACTION Left    CATARACT EXTRACTION W/PHACO Left 12/11/2016   Procedure: CATARACT EXTRACTION PHACO AND INTRAOCULAR LENS PLACEMENT (IOC);  Surgeon: Lockie Mola, MD;  Location: [facility];  Service: Ophthalmology;  Laterality: Left;  IVA TOPICAL LEFT   ERCP N/A 04/19/2022   Procedure: ENDOSCOPIC RETROGRADE CHOLANGIOPANCREATOGRAPHY (ERCP);  Surgeon: Midge Minium, MD;  Location[facility];  Service: Endoscopy;  Laterality: N/A;   NASAL SINUS SURGERY     PACEMAKER GENERATOR CHANGE  05/07/2006   Procedure: PACEMAKER GENERATOR CHANGE; Location: [facility]; Surgeon: Corky Downs, MD   PACEMAKER GENERATOR CHANGE  02/08/2015   Procedure: PACEMAKER GENERATOR CHANGE; Location: [facility]; Surgeon: Corky Downs, MD   PACEMAKER INSERTION  12/06/1999   Procedure: PACEMAKER INSERTION; Location: [facility]; Surgeon: Corky Downs, MD   PACEMAKER INSERTION Left 05/12/2019   Procedure: INSERTION PACEMAKER, LEFT;  Surgeon: Corky Downs, MD;  Location: [facility] ORS;  Service: Cardiovascular;  Laterality: Left;   PROSTATE SURGERY     TRANSURETHRAL RESECTION OF PROSTATE N/A 06/20/2020   Procedure: TRANSURETHRAL RESECTION OF THE PROSTATE (TURP);  Surgeon: Riki Altes, MD;  Location: [facility] ORS;  Service: Urology;  Laterality: N/A;    Allergies: Allergies as of 01/18/2023 - Review Complete 01/18/2023  Allergen Reaction Noted   Bee venom Swelling 01/26/2015   Penicillins Swelling 01/26/2015    Medications: Current Meds  Medication Sig   aspirin 81 MG tablet Take 81 mg by mouth daily.   atorvastatin (LIPITOR) 10 MG tablet Take 10 mg by mouth at bedtime.   cholecalciferol (VITAMIN D) 25 MCG (1000 UNIT) tablet Take 1,000 Units by mouth in the morning and at bedtime.    Coenzyme Q10 100 MG TABS Take 100 mg by mouth daily.    digoxin (LANOXIN) 0.125 MG tablet TAKE 1 TABLET BY MOUTH EVERY DAY   diltiazem (CARDIZEM LA) 180 MG 24 hr tablet Take 180 mg by mouth daily.   levocetirizine (XYZAL) 5 MG tablet Take 5 mg by mouth every evening.   levothyroxine (SYNTHROID) 150 MCG tablet Take 150 mcg by mouth every morning.   losartan (COZAAR) 50 MG tablet Take 50 mg by mouth daily.   Multiple Vitamins-Minerals (PRESERVISION AREDS PO) Take by mouth 2 (two) times daily.   nebivolol (BYSTOLIC) 10 MG tablet Take 10 mg by mouth daily.   Omega-3 Fatty Acids (FISH OIL) 1200 MG CAPS Take 1,200 mg by mouth 3 (three) times daily.   potassium gluconate 595 (99 K) MG TABS tablet Take 595 mg by mouth daily.   spironolactone (ALDACTONE) 25 MG tablet Take 25 mg by mouth daily.   vitamin B-12 (CYANOCOBALAMIN) 1000 MCG tablet Take 1,000 mcg by mouth daily.   vitamin C (ASCORBIC ACID) 500 MG tablet Take 500 mg by mouth daily.   vitamin E 180 MG (400 UNITS) capsule Take 400 Units by mouth daily.    Social History: Social History   Tobacco Use   Smoking status: Former    Types: Cigarettes    Quit date: 01/26/1979    Years since quitting: 44.0   Smokeless tobacco: Never  Vaping Use   Vaping Use: Never used  Substance Use Topics   Alcohol use: No   Drug use: No    Family Medical History: History reviewed. No pertinent family history.  Physical Examination: Vitals:   01/19/23 0840 01/19/23 0843  BP: (!) 134/91   Pulse: 70   Resp: 20   Temp:  97.8 F (36.6 C)  SpO2: 96%     General: Patient is in no apparent distress. Keeps eyes shut  Neck:   Supple.  Full range of motion.  Respiratory: Patient is breathing without any difficulty.   NEUROLOGICAL:     He is awake and talking.  He does not answer orientation questions.  He does not answer yes or no questions.  He has fluent speech but is nonsensical at times.  He is not redirectable.  Cranial Nerves: Pupils equal  round and reactive to light.  Facial tone is symmetric.     He does not cooperate with full motor or sensory examination, but is able to move all 4 limbs symmetrically.  He appears to have intact sensation as best I can tell.  Gait is untested.     Medical Decision Making  Imaging: CT Head 01/18/2023 IMPRESSION: Extra-axial soft tissue again seen in the anterior right frontal region, most likely reflecting extra-axial mass, less likely extra-axial hematoma. No change since prior study.   Atrophy, chronic small vessel disease.     Electronically Signed   By: Charlett Nose M.D.   On: 01/18/2023 21:38  I have personally reviewed the images and agree with  the above interpretation.  Assessment and Plan: Samuel Mack is a pleasant 89 y.o. male with extra-axial lesion in the right anterior fossa most consistent with a meningioma.  He does not have any old imaging to review, but he is likely had this lesion for some time.  It is most consistent with a benign appearing lesion.  I do not think this is likely to be the cause of his symptoms.  I would not recommend any further workup or intervention given his underlying diagnosis of dementia.  I do not think it is worthwhile to arrange an MRI scan as it would not be appropriate to consider surgical resection in the 89 year old gentleman with underlying diagnosis of dementia and multiple other medical issues.  I have communicated my recommendations to the attending physician.     Dinna Severs K. Myer Haff MD, [facility] Neurosurgery

## 2023-01-19 NOTE — Progress Notes (Signed)
PROGRESS NOTE  Samuel Mack ZOX:[id number] DOB: 1929-12-17 DOA: 01/18/2023 PCP: Sherrie Mustache, MD  Brief History   Samuel Mack is an 89 y.o. male past medical history of dementia seen for Worsening agitation Today drastic change in behavior in AM. In the last 24 hours the patient has twice been caught wandering. He is also having delusions and hallucination. Per his daughters he seems to be caught in the past. They have known for some time that the patient had memory issues, but in the last 2 weeks he has declined precipitously.    CXR from ED demonstrated basilar predominant opacities suggesting aspiration, atypical/viral pneumonia, or mild interstitial pulmonary edema. WBC mildly increased. UA was negative for UTI. The patient will be started on ceftriaxone and azithromycin. He will be tested for viral pathogens. SLP will be consulted for swallow evaluation.   CT head performed in the ED demonstrated changes representing either intracranial bleed or hemangioma. Neurosurgery was consulted. Dr. Myer Haff evaluated the patient. He contacted me to let me know that he saw an estra axial lesion in the right anterior fossa that was most consistent with a meningioma - a benign lesion that was unlikely to be a cause of the patient's symptoms.   It is possible that the patient's change in mental status is due to acute pulmonary infection. Will monitor closely for improvement. It is fairly clear that at the base of the patient's presentation is dementia. I have discussed with the daughters that I do not believe that the patient is safe to live on his own in the future.  A & P   * AMS (altered mental status) Likely due to a combination of pneumonia and progressive dementia. The pneumonia may be aspiration in origin. SLP will be consulted to evaluate the patient for swallow safety. The patient will also be tested for viral respiratory infection. He will receive ceftriaxone and azithromycin. Urine is  negative for UTI.  This morning the patient has been very agitated. He has required zyprexa and haldol.   Dementia: Will start acricept. The patient will also be placed on delirium precautions.     Hypothyroidism Continue synthroid as at home when able to swallow safely.    Essential hypertension       Vitals:    01/18/23 1334 01/18/23 1615 01/18/23 2200 01/19/23 0357  BP: 113/61 134/89 (!) 144/80 138/74  Currently patient is confused cannot take in a p.o. meds.  Will defer to a.m. team to assess mental status and swallow eval of and proceed with medications for hypertension.  Also currently blood pressures are soft suspect will need discontinuation as medication and reduction at least.     Stage 3b chronic kidney disease (CKD) (HCC) Monitor creatinine, electrolytes, and volume.    Pacemaker Will admit to telemetry. EKG ordered/.  Interrogation completed. Devise is working appropriately. No arrhythmias noted since last interrogated on 12/02/2022.      I have seen and examined this patient myself. I have spent 42 minutes in the evaluation and care of this patient as well as in talking with the patient's daughters at bedside. All questions answered to the best of my ability.  DVT prophylaxis: Lovenox Code Status: Full code Family Communication: I have discussed the patient in detail with both daughters. Disposition Plan: tbd    Samuel Yaffe, DO [facility] Direct contact: see [email or web address]  7PM-7AM contact night coverage as above 01/19/2023, 5:32 PM  LOS: 0 days   Consultants  Neurosurgery  Procedures  Pacemaker interrogated.  Antibiotics  Starting ceftriaxone and azithromycin  Subjective  The patient is obtunded after receiving haldol.  Objective   Vitals:  Vitals:   01/19/23 0840 01/19/23 0843  BP: (!) 134/91   Pulse: 70   Resp: 20   Temp:  97.8 F (36.6 C)  SpO2: 96%     Exam:  Constitutional:  The patient is restless, but lethargic after just  being given haldol. Eyes:  pupils and irises appear normal Normal lids and conjunctivae ENMT:  grossly normal hearing  Lips appear normal external ears, nose appear normal Oropharynx: mucosa, tongue,posterior pharynx appear normal Neck:  neck appears normal, no masses, normal ROM, supple no thyromegaly Respiratory:  CTA bilaterally, no w/r/r.  Respiratory effort normal. No retractions or accessory muscle use Cardiovascular:  RRR, no m/r/g No LE extremity edema   Normal pedal pulses Abdomen:  Abdomen appears normal; no tenderness or masses No hernias No HSM Musculoskeletal:  Digits/nails BUE: no clubbing, cyanosis, petechiae, infection exam of joints, bones, muscles of at least one of following: head/neck, RUE, LUE, RLE, LLE   strength and tone normal, no atrophy, no abnormal movements No tenderness, masses Normal ROM, no contractures  gait and station Skin:  No rashes, lesions, ulcers palpation of skin: no induration or nodules  I have personally reviewed the following:   Today's Data   Vitals:   01/19/23 0840 01/19/23 0843  BP: (!) 134/91   Pulse: 70   Resp: 20   Temp:  97.8 F (36.6 C)  SpO2: 96%      Lab Data  CBC    Component Value Date/Time   WBC 12.6 (H) 01/19/2023 1021   RBC 4.20 (L) 01/19/2023 1021   HGB 13.0 01/19/2023 1021   HCT 39.5 01/19/2023 1021   PLT 172 01/19/2023 1021   MCV 94.0 01/19/2023 1021   MCH 31.0 01/19/2023 1021   MCHC 32.9 01/19/2023 1021   RDW 14.0 01/19/2023 1021   LYMPHSABS 4.7 (H) 01/19/2023 1021   MONOABS 0.9 01/19/2023 1021   EOSABS 0.5 01/19/2023 1021   BASOSABS 0.1 01/19/2023 1021      Latest Ref Rng & Units 01/19/2023   10:21 AM 01/18/2023    1:37 PM 04/20/2022    5:24 AM  BMP  Glucose 70 - 99 mg/dL 94  161  85   BUN 8 - 23 mg/dL 20  25  28    Creatinine 0.61 - 1.24 mg/dL 0.96  0.45  4.09   Sodium 135 - 145 mmol/L 135  136  131   Potassium 3.5 - 5.1 mmol/L 3.5  4.2  4.4   Chloride 98 - 111 mmol/L 103  102  103    CO2 22 - 32 mmol/L 24  24  19    Calcium 8.9 - 10.3 mg/dL 81.1  91.4  8.7      Micro Data   Results for orders placed or performed during the hospital encounter of 01/18/23  Resp panel by RT-PCR (RSV, Flu A&B, Covid) Anterior Nasal Swab     Status: None   Collection Time: 01/18/23  5:30 PM   Specimen: Anterior Nasal Swab  Result Value Ref Range Status   SARS Coronavirus 2 by RT PCR NEGATIVE NEGATIVE Final    Comment: (NOTE) SARS-CoV-2 target nucleic acids are NOT DETECTED.  The SARS-CoV-2 RNA is generally detectable in upper respiratory specimens during the acute phase of infection. The lowest concentration of SARS-CoV-2 viral copies this assay can detect is 138 copies/mL. A negative  result does not preclude SARS-Cov-2 infection and should not be used as the sole basis for treatment or other patient management decisions. A negative result may occur with  improper specimen collection/handling, submission of specimen other than nasopharyngeal swab, presence of viral mutation(s) within the areas targeted by this assay, and inadequate number of viral copies(<138 copies/mL). A negative result must be combined with clinical observations, patient history, and epidemiological information. The expected result is Negative.  Fact Sheet for Patients:  BloggerCourse.com  Fact Sheet for Healthcare Providers:  SeriousBroker.it  This test is no t yet approved or cleared by the [place] FDA and  has been authorized for detection and/or diagnosis of SARS-CoV-2 by FDA under an Emergency Use Authorization (EUA). This EUA will remain  in effect (meaning this test can be used) for the duration of the COVID-19 declaration under Section 564(b)(1) of the Act, 21 U.S.C.section 360bbb-3(b)(1), unless the authorization is terminated  or revoked sooner.       Influenza A by PCR NEGATIVE NEGATIVE Final   Influenza B by PCR NEGATIVE NEGATIVE Final     Comment: (NOTE) The Xpert Xpress SARS-CoV-2/FLU/RSV plus assay is intended as an aid in the diagnosis of influenza from Nasopharyngeal swab specimens and should not be used as a sole basis for treatment. Nasal washings and aspirates are unacceptable for Xpert Xpress SARS-CoV-2/FLU/RSV testing.  Fact Sheet for Patients: BloggerCourse.com  Fact Sheet for Healthcare Providers: SeriousBroker.it  This test is not yet approved or cleared by the [place] FDA and has been authorized for detection and/or diagnosis of SARS-CoV-2 by FDA under an Emergency Use Authorization (EUA). This EUA will remain in effect (meaning this test can be used) for the duration of the COVID-19 declaration under Section 564(b)(1) of the Act, 21 U.S.C. section 360bbb-3(b)(1), unless the authorization is terminated or revoked.     Resp Syncytial Virus by PCR NEGATIVE NEGATIVE Final    Comment: (NOTE) Fact Sheet for Patients: BloggerCourse.com  Fact Sheet for Healthcare Providers: SeriousBroker.it  This test is not yet approved or cleared by the [place] FDA and has been authorized for detection and/or diagnosis of SARS-CoV-2 by FDA under an Emergency Use Authorization (EUA). This EUA will remain in effect (meaning this test can be used) for the duration of the COVID-19 declaration under Section 564(b)(1) of the Act, 21 U.S.C. section 360bbb-3(b)(1), unless the authorization is terminated or revoked.  Performed at [facility], [address] [place], [place] [address]      Imaging  CXR from ED demonstrated basilar predominant opacities suggesting aspiration, atypical/viral pneumonia, or mild interstitial pulmonary edema.   Cardiology Data  EKG has demonstrated a paced rhythm  Scheduled Meds:  [START ON 01/20/2023]  stroke: early stages of recovery book   Does not apply Once    pantoprazole (PROTONIX) IV  40 mg Intravenous QHS   Continuous Infusions:  Principal Problem:   AMS (altered mental status) Active Problems:   Hypothyroidism   Essential hypertension   Pacemaker   Stage 3b chronic kidney disease (CKD) (HCC)   Meningioma (HCC)   Altered mental status   LOS: 0 days

## 2023-01-19 NOTE — Assessment & Plan Note (Signed)
Again a.m. team to resume levothyroxine if patient is able to pass swallow test tomorrow morning.

## 2023-01-19 NOTE — ED Notes (Signed)
Pts family calling out d/t pt stating he has to urinate. This tech into rm and held urinal in place but pt not able to void. Hunter, RN into rm to help this tech clean pt- pt had bowel movement. Pts underwear had stool on them and placed in a belongings bag- given to family at bedside. Pt with new brief in place. Family at bedside; informed family to notify if and when they decide to leave d/t pt needing someone at bedside at all times. No other needs voiced at this time.

## 2023-01-19 NOTE — ED Notes (Addendum)
Pt soiled his bed linen. NT offered pt th urinal, pt used his hand to move the urinal away as he urinated on the bed. NT called Richard, RN in to help change the pt and bed linen; brief placed on pt due to personal underwear being soiled. Pt dry and resting comfortably at this time.

## 2023-01-19 NOTE — ED Notes (Signed)
Advised nurse that patient has ready bed 
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
## 2023-01-19 NOTE — Progress Notes (Signed)
SLP Cancellation Note  Patient Details Name: Samuel Mack MRN: [id number] DOB: [birth year]   Cancelled treatment:       Reason Eval/Treat Not Completed: Patient not medically ready. Per chart review, pt's agitation is a barrier to cognitive-linguistic evaluation. Will continue efforts as appropriate.  Clyde Canterbury, M.S., CCC-SLP Speech-Language Pathologist [facility] [phone] (ASCOM)  Woodroe Chen 01/19/2023, 8:08 AM

## 2023-01-19 NOTE — Assessment & Plan Note (Addendum)
Vitals:   01/18/23 1334 01/18/23 1615 01/18/23 2200 01/19/23 0357  BP: 113/61 134/89 (!) 144/80 138/74  Currently patient is confused cannot take in a p.o. meds.  Will defer to a.m. team to assess mental status and swallow eval of and proceed with medications for hypertension.  Also currently blood pressures are soft suspect will need discontinuation as medication and reduction at least.

## 2023-01-19 NOTE — ED Notes (Signed)
This NT observed pt pulling at his tshirt attempting to remove it, this NT helped pt remove his tshirt and placed pt in a hospital gown. Pt removed gown twice. Pt left in a brief only with warm blankets.

## 2023-01-19 NOTE — ED Notes (Signed)
Irena Cords, MD at bedside.

## 2023-01-19 NOTE — ED Notes (Signed)
Pt soiled with bowel movement. Pt cleaned by this RN and tech Emilee. New brief placed on pt.

## 2023-01-19 NOTE — Assessment & Plan Note (Signed)
Initial CT findings were concerning for subdural hemorrhage. MRI of the brain contraindicated secondary to pacemaker.  And repeat CT showed more likely a hemangioma. Differentials include dementia sundowning. Will admit patient to monitor observe and neurosurgery to follow.

## 2023-01-20 DIAGNOSIS — R4182 Altered mental status, unspecified: Secondary | ICD-10-CM | POA: Diagnosis not present

## 2023-01-20 LAB — CBC
HCT: 41.3 % (ref 39.0–52.0)
Hemoglobin: 13.8 g/dL (ref 13.0–17.0)
MCH: 31.2 pg (ref 26.0–34.0)
MCHC: 33.4 g/dL (ref 30.0–36.0)
MCV: 93.2 fL (ref 80.0–100.0)
Platelets: 196 10*3/uL (ref 150–400)
RBC: 4.43 MIL/uL (ref 4.22–5.81)
RDW: 14 % (ref 11.5–15.5)
WBC: 15.4 10*3/uL — ABNORMAL HIGH (ref 4.0–10.5)
nRBC: 0 % (ref 0.0–0.2)

## 2023-01-20 LAB — BASIC METABOLIC PANEL
Anion gap: 10 (ref 5–15)
BUN: 19 mg/dL (ref 8–23)
CO2: 27 mmol/L (ref 22–32)
Calcium: 10.5 mg/dL — ABNORMAL HIGH (ref 8.9–10.3)
Chloride: 104 mmol/L (ref 98–111)
Creatinine, Ser: 1.31 mg/dL — ABNORMAL HIGH (ref 0.61–1.24)
GFR, Estimated: 51 mL/min — ABNORMAL LOW (ref >60)
Glucose, Bld: 83 mg/dL (ref 70–99)
Potassium: 3.8 mmol/L (ref 3.5–5.1)
Sodium: 141 mmol/L (ref 135–145)

## 2023-01-20 LAB — RESPIRATORY PANEL BY PCR

## 2023-01-20 LAB — CULTURE, BLOOD (ROUTINE X 2): Special Requests: ADEQUATE

## 2023-01-20 LAB — PHOSPHORUS: Phosphorus: 3 mg/dL (ref 2.5–4.6)

## 2023-01-20 LAB — MAGNESIUM: Magnesium: 1.3 mg/dL — ABNORMAL LOW (ref 1.7–2.4)

## 2023-01-20 MED ORDER — DEXTROSE IN LACTATED RINGERS 5 % IV SOLN: INTRAVENOUS | Status: DC

## 2023-01-20 MED ORDER — HYDRALAZINE HCL 20 MG/ML IJ SOLN: 10.0000 mg | Freq: Four times a day (QID) | INTRAMUSCULAR | Status: DC | PRN

## 2023-01-20 MED ORDER — MAGNESIUM SULFATE 4 GM/100ML IV SOLN
4.0000 g | Freq: Once | INTRAVENOUS | Status: AC
  Administered 2023-01-20: 4 g via INTRAVENOUS
  Filled 2023-01-20: qty 100

## 2023-01-20 NOTE — Progress Notes (Signed)
[facility] Progress Note  Patient: Samuel Mack    ZOX:[id number]  DOA: 01/18/2023     Date of Service: the patient was seen and examined on 01/20/2023  Chief Complaint  Patient presents with   Altered Mental Status   Brief hospital course: Samuel Mack is an 89 y.o. male past medical history of dementia seen for Worsening agitation Today drastic change in behavior in AM. In the last 24 hours the patient has twice been caught wandering. He is also having delusions and hallucination. Per his daughters he seems to be caught in the past. They have known for some time that the patient had memory issues, but in the last 2 weeks he has declined precipitously.     CXR from ED demonstrated basilar predominant opacities suggesting aspiration, atypical/viral pneumonia, or mild interstitial pulmonary edema. WBC mildly increased. UA was negative for UTI. The patient will be started on ceftriaxone and azithromycin. He will be tested for viral pathogens. SLP will be consulted for swallow evaluation.    CT head performed in the ED demonstrated changes representing either intracranial bleed or hemangioma. Neurosurgery was consulted. Dr. Myer Haff evaluated the patient. He contacted me to let me know that he saw an estra axial lesion in the right anterior fossa that was most consistent with a meningioma - a benign lesion that was unlikely to be a cause of the patient's symptoms.    It is possible that the patient's change in mental status is due to acute pulmonary infection. Will monitor closely for improvement. It is fairly clear that at the base of the patient's presentation is dementia. I have discussed with the daughters that I do not believe that the patient is safe to live on his own in the future.   Assessment and Plan:  # AMS (altered mental status) Likely due to a combination of pneumonia and progressive dementia. The pneumonia may be aspiration in origin. SLP will be consulted to evaluate the  patient for swallow safety.  COVID flu and RSV negative Respiratory viral panel was not collected yesterday, RN was advised to send RVP swab today. Continue ceftriaxone and azithromycin. Urine is negative for UTI. Agitation s/p Zyprexa and haldol.  WBC elevated, afebrile, we will continue to monitor. Started IV fluid for hydration  Urinary retention Foley catheter was inserted on 5/6 Will start Flomax when patient is able to take oral medications DC Foley for voiding trial when patient is ambulatory and alert   Hypomagnesemia, mag repleted. Monitor electrolytes and replete as needed.  Dementia: Continue delirium precautions and supportive care      Hypothyroidism Continue synthroid as at home when able to swallow safely.    Essential hypertension Continue home meds Cardizem and Bystolic Currently patient is not able to swallow due to altered mental status Use IV hydralazine as needed Monitor BP and titrate medication accordingly    Stage 3b chronic kidney disease (CKD) (HCC) Monitor creatinine, electrolytes, and volume.    S/p Pacemaker, Interrogation completed. Devise is working appropriately. No arrhythmias noted since last interrogated on 12/02/2022.    Body mass index is 33.05 kg/m.  Nutrition Problem: Inadequate oral intake Etiology: lethargy/confusion Interventions: Interventions: Refer to RD note for recommendations     Diet: NPO due to AMS DVT Prophylaxis: Subcutaneous Lovenox   Advance goals of care discussion: DNR  Family Communication: family was present at bedside, at the time of interview.  The pt provided permission to discuss medical plan with the family. Opportunity was given  to ask question and all questions were answered satisfactorily.   Disposition:  Pt is from Home, admitted with AMS, worsening of dementia, still has altered mental status, n.p.o., which precludes a safe discharge. Discharge to SNF/LTC with hospice services most likely after  palliative care evaluation   Subjective: No significant events overnight, patient remained disoriented and confused, unable to follow commands and communicate.  Moving all extremities spontaneously Goals of care discussed with family agreed with the DNR status and palliative care.  Physical Exam: General: Moving all extremities spontaneously, seems restless Appear in no distress, affect AMS Eyes: closed ENT: Oral Mucosa dry, unable to open mouth  Neck: no JVD,  Cardiovascular: S1 and S2 Present, no Murmur,  Respiratory: good respiratory effort, Bilateral Air entry equal and Decreased, no Crackles, no wheezes Abdomen: Bowel Sound present, Soft and no tenderness,  Skin: no rashes Extremities: no Pedal edema, no calf tenderness Neurologic: without any new focal findings, AMS, when all extremities spontaneously Gait not checked due to patient safety concerns  Vitals:   01/19/23 2215 01/19/23 2305 01/20/23 0751 01/20/23 1145  BP: (!) 114/55 (!) 179/115 (!) 153/78 104/89  Pulse:  72 98 72  Resp:  20 16 18   Temp:  97.6 F (36.4 C) 97.7 F (36.5 C) 98 F (36.7 C)  TempSrc:  Axillary Oral   SpO2:  93% 94% 100%  Weight:      Height:        Intake/Output Summary (Last 24 hours) at 01/20/2023 1542 Last data filed at 01/20/2023 1513 Gross per 24 hour  Intake --  Output 600 ml  Net -600 ml   Filed Weights   01/18/23 1335  Weight: 95.7 kg    Data Reviewed: I have personally reviewed and interpreted daily labs, tele strips, imagings as discussed above. I reviewed all nursing notes, pharmacy notes, vitals, pertinent old records I have discussed plan of care as described above with RN and patient/family.  CBC: Recent Labs  Lab 01/18/23 1337 01/19/23 1021 01/20/23 0851  WBC 12.2* 12.6* 15.4*  NEUTROABS  --  6.4  --   HGB 14.0 13.0 13.8  HCT 44.0 39.5 41.3  MCV 94.6 94.0 93.2  PLT 186 172 196   Basic Metabolic Panel: Recent Labs  Lab 01/18/23 1337 01/19/23 1021  01/20/23 0851  NA 136 135 141  K 4.2 3.5 3.8  CL 102 103 104  CO2 24 24 27   GLUCOSE 110* 94 83  BUN 25* 20 19  CREATININE 1.69* 1.34* 1.31*  CALCIUM 10.9* 10.3 10.5*  MG  --   --  1.3*  PHOS  --   --  3.0    Studies: No results found.  Scheduled Meds:  ascorbic acid  500 mg Oral Daily   aspirin EC  81 mg Oral Daily   atorvastatin  10 mg Oral QHS   cholecalciferol  1,000 Units Oral Daily   cyanocobalamin  1,000 mcg Oral Daily   diltiazem  180 mg Oral Daily   enoxaparin (LOVENOX) injection  40 mg Subcutaneous Q24H   levothyroxine  150 mcg Oral q morning   multivitamin-lutein  1 capsule Oral BID   nebivolol  10 mg Oral Daily   pantoprazole (PROTONIX) IV  40 mg Intravenous QHS   spironolactone  25 mg Oral Daily   Continuous Infusions:  azithromycin Stopped (01/19/23 2107)   cefTRIAXone (ROCEPHIN)  IV Stopped (01/19/23 1948)   magnesium sulfate bolus IVPB 4 g (01/20/23 1508)   PRN Meds: acetaminophen **OR**  acetaminophen (TYLENOL) oral liquid 160 mg/5 mL **OR** acetaminophen, haloperidol lactate  Time spent: 35 minutes  Author: Gillis Santa. MD [facility] 01/20/2023 3:42 PM  To reach On-call, see care teams to locate the attending and reach out to them via [email or web address]. If 7PM-7AM, please contact night-coverage If you still have difficulty reaching the attending provider, please page the [facility] (Director on Call) for [facility] on amion for assistance.

## 2023-01-20 NOTE — Progress Notes (Signed)
OT Cancellation Note  Patient Details Name: Samuel Mack MRN: [id number] DOB: 11-30-1929   Cancelled Treatment:    Reason Eval/Treat Not Completed: Patient not medically ready. Pt is restless this morning and needing haldol for AMS. Per RN,  pt is now resting comfortably in bed. OT to hold and re-attempt when pt is next available to participate.   Jackquline Denmark, MS, OTR/L , CBIS ascom [phone]  01/20/23, 11:14 AM

## 2023-01-20 NOTE — TOC Initial Note (Signed)
Transition of Care [facility]) - Initial/Assessment Note    Patient Details  Name: Samuel Mack MRN: [id number] Date of Birth: 04-22-1930  Transition of Care [facility] CM/SW Contact:    Allena Katz, LCSW Phone Number: 01/20/2023, 9:04 AM  Clinical Narrative:    [facility] consulted for SNF. No PT/OT recs at this time. Pt requiring Haldol and has AMS. TOC continuing to follow for care plan changes and updates.               Expected Discharge Plan: Skilled Nursing Facility     Patient Goals and CMS Choice            Expected Discharge Plan and Services                                              Prior Living Arrangements/Services                       Activities of Daily Living Home Assistive Devices/Equipment: Other (Comment) ADL Screening (condition at time of admission) Patient's cognitive ability adequate to safely complete daily activities?: No Is the patient deaf or have difficulty hearing?: Yes Does the patient have difficulty seeing, even when wearing glasses/contacts?: Yes Does the patient have difficulty concentrating, remembering, or making decisions?: Yes Patient able to express need for assistance with ADLs?: No Does the patient have difficulty dressing or bathing?: Yes Independently performs ADLs?: No Communication: Independent Dressing (OT): Needs assistance Is this a change from baseline?: Pre-admission baseline Grooming: Needs assistance Is this a change from baseline?: Pre-admission baseline Feeding: Needs assistance Is this a change from baseline?: Pre-admission baseline Bathing: Needs assistance Is this a change from baseline?: Pre-admission baseline Toileting: Needs assistance Is this a change from baseline?: Pre-admission baseline In/Out Bed: Needs assistance Is this a change from baseline?: Pre-admission baseline Walks in Home: Needs assistance Is this a change from baseline?: Pre-admission baseline Does the patient have  difficulty walking or climbing stairs?: Yes Weakness of Legs: None Weakness of Arms/Hands: None  Permission Sought/Granted                  Emotional Assessment       Orientation: : Fluctuating Orientation (Suspected and/or reported Sundowners)      Admission diagnosis:  Altered mental status [R41.82] Altered mental status, unspecified altered mental status type [R41.82] AMS (altered mental status) [R41.82] Patient Active Problem List   Diagnosis Date Noted   AMS (altered mental status) 01/19/2023   Meningioma (HCC) 01/19/2023   Altered mental status 01/19/2023   Nausea and vomiting 08/27/2022   Transaminitis 08/27/2022   Calculus of gallbladder without cholecystitis without obstruction    Calculus of common duct    Elevated LFTs 04/17/2022   Dyslipidemia 04/17/2022   Hypothyroidism 04/17/2022   Sinus bradycardia 04/17/2022   Stage 3b chronic kidney disease (CKD) (HCC) 04/17/2022   Obesity (BMI 30-39.9) 07/23/2021   Pre-op evaluation 06/13/2020   Seasonal allergic rhinitis due to pollen 04/29/2020   Essential hypertension 04/29/2020   Pacemaker 01/20/2020   SSS (sick sinus syndrome) (HCC) 01/20/2020   PCP:  Sherrie Mustache, MD Pharmacy:   [phone] [address], [place] - [address] [place] [address] Phone: [phone] Fax: [phone]     Social Determinants of Health (SDOH) Social History: SDOH Screenings   Food Insecurity: No  Food Insecurity (01/20/2023)  Housing: Low Risk  (01/20/2023)  Transportation Needs: No Transportation Needs (01/20/2023)  Utilities: Not At Risk (01/20/2023)  Tobacco Use: Medium Risk (01/19/2023)   SDOH Interventions: Food Insecurity Interventions: Intervention Not Indicated Housing Interventions: Intervention Not Indicated Transportation Interventions: Intervention Not Indicated Utilities Interventions: Intervention Not Indicated   Readmission Risk Interventions     No data to display

## 2023-01-20 NOTE — Progress Notes (Signed)
SLP Cancellation Note  Patient Details Name: Samuel Mack MRN: [id number] DOB: 1930/06/06   Cancelled treatment:       Reason Eval/Treat Not Completed: Medical issues which prohibited therapy;Patient not medically ready (Pt requiring Haldol for AMS. Cognitive-linguistic evaluation deferred at this time. Will continue efforts as appropriate.)  Clyde Canterbury, M.S., CCC-SLP Speech-Language Pathologist [facility] [phone] [name])  Woodroe Chen 01/20/2023, 9:57 AM

## 2023-01-20 NOTE — Progress Notes (Signed)
Initial Nutrition Assessment  DOCUMENTATION CODES:   Obesity unspecified  INTERVENTION:   -RD will follow for diet advancement and goals of care and make further recommendations as appropriate  NUTRITION DIAGNOSIS:   Inadequate oral intake related to lethargy/confusion as evidenced by NPO status.  GOAL:   Patient will meet greater than or equal to 90% of their needs  MONITOR:   PO intake, Supplement acceptance  REASON FOR ASSESSMENT:   Malnutrition Screening Tool    ASSESSMENT:   Pt with past medical history of dementia seen for worsening agitation.  Pt admitted with AMS.   Per MD notes, initial CT findings concerning for SDH. Per neurosurgery notes, extra-axial lesion in the right anterior fossa most consistent with a meningioma; does not think this is the cause of her symptoms.   Pt with AMS and unable to respond. Unable to eat at this time secondary to mental status.   Pt in with MD at time of visit. Pt unavailable at time of visit. RD unable to obtain further nutrition-related history or complete nutrition-focused physical exam at this time.    Palliative care consult pending for goals of care. Pt may require hospice.   Reviewed wt hx; pt has experienced a 4.3% wt loss over the past 8 months, which is not significant for time frame.   Medications reviewed and include vitamin C, vitamin D3, vitamin B-12, cardizem, magnesium sulfate, and aldactone.   Labs reviewed: CBGS: 124. Mg: 1.3.    Diet Order:   Diet Order             Diet Heart Room service appropriate? Yes; Fluid consistency: Thin  Diet effective now                   EDUCATION NEEDS:   No education needs have been identified at this time  Skin:  Skin Assessment: Reviewed RN Assessment  Last BM:  Unknown  Height:   Ht Readings from Last 1 Encounters:  01/18/23 5\' 7"  (1.702 m)    Weight:   Wt Readings from Last 1 Encounters:  01/18/23 95.7 kg    Ideal Body Weight:  67.3  kg  BMI:  Body mass index is 33.05 kg/m.  Estimated Nutritional Needs:   Kcal:  1800-2000  Protein:  100-115 grams  Fluid:  > 1.8 L    Levada Schilling, RD, LDN, CDCES Registered Dietitian II Certified Diabetes Care and Education Specialist Please refer to [facility] for RD and/or RD on-call/weekend/after hours pager

## 2023-01-20 NOTE — Progress Notes (Signed)
PT Cancellation Note  Patient Details Name: Samuel Mack MRN: [id number] DOB: Oct 26, 1929   Cancelled Treatment:    Reason Eval/Treat Not Completed: Patient not medically ready. Pt is restless this morning and needing haldol for AMS. Per OT/RN, pt is now resting comfortably in bed. PT to hold and re-attempt when pt is next available to participate.   Olga Coaster PT, DPT 11:16 AM,01/20/23

## 2023-01-21 DIAGNOSIS — R4182 Altered mental status, unspecified: Secondary | ICD-10-CM | POA: Diagnosis not present

## 2023-01-21 DIAGNOSIS — Z515 Encounter for palliative care: Secondary | ICD-10-CM

## 2023-01-21 DIAGNOSIS — I10 Essential (primary) hypertension: Secondary | ICD-10-CM | POA: Diagnosis not present

## 2023-01-21 DIAGNOSIS — E039 Hypothyroidism, unspecified: Secondary | ICD-10-CM

## 2023-01-21 DIAGNOSIS — F03C11 Unspecified dementia, severe, with agitation: Secondary | ICD-10-CM

## 2023-01-21 LAB — BASIC METABOLIC PANEL
Anion gap: 14 (ref 5–15)
BUN: 20 mg/dL (ref 8–23)
CO2: 21 mmol/L — ABNORMAL LOW (ref 22–32)
Calcium: 9.8 mg/dL (ref 8.9–10.3)
Chloride: 104 mmol/L (ref 98–111)
Creatinine, Ser: 1.39 mg/dL — ABNORMAL HIGH (ref 0.61–1.24)
GFR, Estimated: 48 mL/min — ABNORMAL LOW (ref >60)
Glucose, Bld: 103 mg/dL — ABNORMAL HIGH (ref 70–99)
Potassium: 3.4 mmol/L — ABNORMAL LOW (ref 3.5–5.1)
Sodium: 139 mmol/L (ref 135–145)

## 2023-01-21 LAB — MAGNESIUM: Magnesium: 2 mg/dL (ref 1.7–2.4)

## 2023-01-21 LAB — CBC
HCT: 38.2 % — ABNORMAL LOW (ref 39.0–52.0)
Hemoglobin: 12.9 g/dL — ABNORMAL LOW (ref 13.0–17.0)
MCH: 31 pg (ref 26.0–34.0)
MCHC: 33.8 g/dL (ref 30.0–36.0)
MCV: 91.8 fL (ref 80.0–100.0)
Platelets: 174 10*3/uL (ref 150–400)
RBC: 4.16 MIL/uL — ABNORMAL LOW (ref 4.22–5.81)
RDW: 14 % (ref 11.5–15.5)
WBC: 12.7 10*3/uL — ABNORMAL HIGH (ref 4.0–10.5)
nRBC: 0 % (ref 0.0–0.2)

## 2023-01-21 LAB — PHOSPHORUS: Phosphorus: 3.1 mg/dL (ref 2.5–4.6)

## 2023-01-21 LAB — CULTURE, BLOOD (ROUTINE X 2): Culture: NO GROWTH

## 2023-01-21 MED ORDER — STERILE WATER FOR INJECTION IJ SOLN
INTRAMUSCULAR | Status: AC
  Administered 2023-01-21: 10 mL via INTRAVENOUS
  Filled 2023-01-21: qty 10

## 2023-01-21 MED ORDER — CHLORHEXIDINE GLUCONATE CLOTH 2 % EX PADS
6.0000 | MEDICATED_PAD | Freq: Every day | CUTANEOUS | Status: DC
  Administered 2023-01-21 – 2023-01-24 (×4): 6 via TOPICAL

## 2023-01-21 MED ORDER — BACITRACIN-POLYMYXIN B 500-10000 UNIT/GM OP OINT
TOPICAL_OINTMENT | Freq: Two times a day (BID) | OPHTHALMIC | Status: DC
  Administered 2023-01-21: 1 via OPHTHALMIC
  Filled 2023-01-21: qty 3.5

## 2023-01-21 MED ORDER — PROSIGHT PO TABS
1.0000 | ORAL_TABLET | Freq: Two times a day (BID) | ORAL | Status: DC
  Administered 2023-01-22: 1 via ORAL
  Filled 2023-01-21 (×5): qty 1

## 2023-01-21 NOTE — Progress Notes (Signed)
[facility]- Civil engineer, contracting [facility])  Referral received to evaluate the patient for the IPU.  HL scheduled to meet with patient's family in the room tomorrow at 10:30am.    Please don't hesitate to call with any Hospice related questions or concerns.    Thank you for the opportunity to participate in this patient's care.  [facility] Liaison [phone]      Referral received to evaluate the patient for the IPU.  HL scheduled to meet with patient's family in the room tomorrow at 10:30am.

## 2023-01-21 NOTE — TOC Initial Note (Addendum)
Transition of Care [facility]) - Initial/Assessment Note    Patient Details  Name: Samuel Mack MRN: [id number] Date of Birth: 08/20/1930  Transition of Care [facility]) CM/SW Contact:    Allena Katz, LCSW Phone Number: 01/21/2023, 2:23 PM  Clinical Narrative:   CSW spoke with both patients daughters about care plan. Daughters report that they do not want rehab for their dad and want to pursue hospice. Daughters state palliative was just in speaking with them and they would like to use [facility] for hospice services. Message sent to [facility].                 Expected Discharge Plan: Home w Hospice Care Barriers to Discharge: Continued Medical Work up   Patient Goals and CMS Choice Patient states their goals for this hospitalization and ongoing recovery are:: Return home with hospice CMS Medicare.gov Compare Post Acute Care list provided to:: Patient Represenative (must comment) (daughters)        Expected Discharge Plan and Services     Post Acute Care Choice: Hospice                                        Prior Living Arrangements/Services   Lives with:: Adult Children Patient language and need for interpreter reviewed:: Yes        Need for Family Participation in Patient Care: Yes (Comment) Care giver support system in place?: Yes (comment)      Activities of Daily Living Home Assistive Devices/Equipment: Other (Comment) ADL Screening (condition at time of admission) Patient's cognitive ability adequate to safely complete daily activities?: No Is the patient deaf or have difficulty hearing?: Yes Does the patient have difficulty seeing, even when wearing glasses/contacts?: Yes Does the patient have difficulty concentrating, remembering, or making decisions?: Yes Patient able to express need for assistance with ADLs?: No Does the patient have difficulty dressing or bathing?: Yes Independently performs ADLs?: No Communication: Independent Dressing (OT):  Needs assistance Is this a change from baseline?: Pre-admission baseline Grooming: Needs assistance Is this a change from baseline?: Pre-admission baseline Feeding: Needs assistance Is this a change from baseline?: Pre-admission baseline Bathing: Needs assistance Is this a change from baseline?: Pre-admission baseline Toileting: Needs assistance Is this a change from baseline?: Pre-admission baseline In/Out Bed: Needs assistance Is this a change from baseline?: Pre-admission baseline Walks in Home: Needs assistance Is this a change from baseline?: Pre-admission baseline Does the patient have difficulty walking or climbing stairs?: Yes Weakness of Legs: None Weakness of Arms/Hands: None  Permission Sought/Granted Permission sought to share information with : Family Supports       Permission granted to share info w AGENCY: [facility]        Emotional Assessment       Orientation: : Fluctuating Orientation (Suspected and/or reported Sundowners)      Admission diagnosis:  Altered mental status [R41.82] Altered mental status, unspecified altered mental status type [R41.82] AMS (altered mental status) [R41.82] Patient Active Problem List   Diagnosis Date Noted   AMS (altered mental status) 01/19/2023   Meningioma (HCC) 01/19/2023   Altered mental status 01/19/2023   Nausea and vomiting 08/27/2022   Transaminitis 08/27/2022   Calculus of gallbladder without cholecystitis without obstruction    Calculus of common duct    Elevated LFTs 04/17/2022   Dyslipidemia 04/17/2022   Hypothyroidism 04/17/2022   Sinus bradycardia 04/17/2022   Stage 3b chronic  kidney disease (CKD) (HCC) 04/17/2022   Obesity (BMI 30-39.9) 07/23/2021   Pre-op evaluation 06/13/2020   Seasonal allergic rhinitis due to pollen 04/29/2020   Essential hypertension 04/29/2020   Pacemaker 01/20/2020   SSS (sick sinus syndrome) (HCC) 01/20/2020   PCP:  Sherrie Mustache, MD Pharmacy:   [phone] [address], [place] - [address] [place] [address] Phone: [phone] Fax: [phone]     Social Determinants of Health (SDOH) Social History: SDOH Screenings   Food Insecurity: No Food Insecurity (01/20/2023)  Housing: Low Risk  (01/20/2023)  Transportation Needs: No Transportation Needs (01/20/2023)  Utilities: Not At Risk (01/20/2023)  Tobacco Use: Medium Risk (01/19/2023)   SDOH Interventions: Food Insecurity Interventions: Intervention Not Indicated Housing Interventions: Intervention Not Indicated Transportation Interventions: Intervention Not Indicated Utilities Interventions: Intervention Not Indicated   Readmission Risk Interventions     No data to display

## 2023-01-21 NOTE — Evaluation (Signed)
Occupational Therapy Evaluation Patient Details Name: Samuel Mack MRN: [id number] DOB: [birth year] Today's Date: 01/21/2023   History of Present Illness Pt is a 89 y.o. male presenting to hospital 01/18/23 with AMS (delusions, hallucinations, agitation).  Imaging showing meningioma (CT of head)--unlikely to be cause of pt's symptoms per neurosurgery.  Pt admitted with AMS.  PMH includes htn, HLD, DM, dementia, AKI, a-fib, CHB (PPM in place), COPD, deafness L ear, decreased vision L eye, essential trmor, MI.   Clinical Impression   Patient presenting with decreased Ind in self care,balance, functional mobility/transfers, endurance, and safety awareness. Pt lives at home alone at baseline. Patient is pleasantly confused and oriented to self only with very tangential speech. He is seated in recliner chair and appears to be very restless and impulsive. B hand mitts removed for transfer. Pt stands with mod lifting assistance and holds onto B hands of therapist and takes small shuffling steps to bed.  Pt appears quite fatigued and needing max A to return to supine for B LEs and trunk support. Pt given warm cloth and washes face with set up A. Family enters the room to assist pt with meal tray. Patient will benefit from acute OT to increase overall independence in the areas of ADLs, functional mobility, and safety awareness in order to safely discharge.     Recommendations for follow up therapy are one component of a multi-disciplinary discharge planning process, led by the attending physician.  Recommendations may be updated based on patient status, additional functional criteria and insurance authorization.   Assistance Recommended at Discharge Frequent or constant Supervision/Assistance  Patient can return home with the following A lot of help with walking and/or transfers;A lot of help with bathing/dressing/bathroom;Assistance with cooking/housework;Assist for transportation;Help with stairs or ramp for  entrance;Direct supervision/assist for financial management;Direct supervision/assist for medications management    Functional Status Assessment  Patient has had a recent decline in their functional status and demonstrates the ability to make significant improvements in function in a reasonable and predictable amount of time.  Equipment Recommendations  Other (comment) (defer to next venue of care)       Precautions / Restrictions Precautions Precautions: Fall Restrictions Weight Bearing Restrictions: No      Mobility Bed Mobility Overal bed mobility: Needs Assistance Bed Mobility: Sit to Supine       Sit to supine: HOB elevated, Max assist   General bed mobility comments: assistance for trunk and B LEs    Transfers Overall transfer level: Needs assistance Equipment used: 1 person hand held assist Transfers: Sit to/from Stand, Bed to chair/wheelchair/BSC Sit to Stand: Min assist     Step pivot transfers: Min assist, Mod assist            Balance Overall balance assessment: Needs assistance Sitting-balance support: No upper extremity supported, Feet supported Sitting balance-Leahy Scale: Good Sitting balance - Comments: steady sitting reaching within BOS   Standing balance support: Bilateral upper extremity supported, During functional activity Standing balance-Leahy Scale: Poor Standing balance comment: assist for balance in standing                           ADL either performed or assessed with clinical judgement   ADL Overall ADL's : Needs assistance/impaired     Grooming: Wash/dry hands;Wash/dry face;Bed level;Supervision/safety;Set up                   Toilet Transfer: Moderate assistance;Stand-pivot;Cueing for Chief of Staff  Details (indicate cue type and reason): simulated transfer mod a step pivot transfer with B hand held                 Vision Patient Visual Report: No change from baseline               Pertinent Vitals/Pain Pain Assessment Pain Assessment: Faces Faces Pain Scale: No hurt     Hand Dominance Right   Extremity/Trunk Assessment Upper Extremity Assessment Upper Extremity Assessment: Generalized weakness   Lower Extremity Assessment Lower Extremity Assessment: Generalized weakness   Cervical / Trunk Assessment Cervical / Trunk Assessment: Other exceptions Cervical / Trunk Exceptions: forward head/shoulders   Communication Communication Communication: No difficulties   Cognition Arousal/Alertness: Awake/alert Behavior During Therapy: Impulsive, Restless Overall Cognitive Status: Impaired/Different from baseline Area of Impairment: Orientation, Attention, Following commands, Safety/judgement, Awareness, Problem solving, Memory                 Orientation Level: Disoriented to, Place, Time, Situation Current Attention Level: Sustained, Focused Memory: Decreased recall of precautions, Decreased short-term memory Following Commands: Follows one step commands inconsistently Safety/Judgement: Decreased awareness of safety, Decreased awareness of deficits   Problem Solving: Slow processing, Difficulty sequencing, Requires verbal cues, Requires tactile cues General Comments: Oriented to name only. He is able to be redirected and follows commands with increased time to process                Home Living Family/patient expects to be discharged to:: Unsure Living Arrangements: Alone Available Help at Discharge: Family;Available PRN/intermittently Type of Home: Other(Comment) (condo) Home Access: Stairs to enter Entrance Stairs-Number of Steps: 4 Entrance Stairs-Rails: Right;Left Home Layout: One level     Bathroom Shower/Tub: Chief Strategy Officer: Standard     Home Equipment: Grab bars - tub/shower;Cane - single Librarian, academic (2 wheels)          Prior Functioning/Environment Prior Level of Function : Independent/Modified  Independent             Mobility Comments: Independent with ambulation.  Pt's daughter reports they haven't seen pt fall but has signs of falling. ADLs Comments: Ind in self care tasks        OT Problem List: Decreased strength;Decreased activity tolerance;Decreased safety awareness;Impaired balance (sitting and/or standing);Decreased knowledge of use of DME or AE;Decreased cognition      OT Treatment/Interventions: Self-care/ADL training;Therapeutic exercise;Therapeutic activities;Energy conservation;DME and/or AE instruction;Patient/family education;Balance training;Cognitive remediation/compensation    OT Goals(Current goals can be found in the care plan section) Acute Rehab OT Goals Patient Stated Goal: to increase strength and return to PLOF OT Goal Formulation: With family Time For Goal Achievement: 02/04/23 Potential to Achieve Goals: Fair ADL Goals Pt Will Perform Grooming: with min guard assist;standing Pt Will Perform Lower Body Dressing: with min assist;sit to/from stand Pt Will Transfer to Toilet: with min assist;ambulating Pt Will Perform Toileting - Clothing Manipulation and hygiene: with min assist;sit to/from stand  OT Frequency: Min 1X/week       AM-PAC OT "6 Clicks" Daily Activity     Outcome Measure Help from another person eating meals?: A Lot Help from another person taking care of personal grooming?: A Lot Help from another person toileting, which includes using toliet, bedpan, or urinal?: Total Help from another person bathing (including washing, rinsing, drying)?: Total Help from another person to put on and taking off regular upper body clothing?: A Lot Help from another person to put on and taking off regular  lower body clothing?: Total 6 Click Score: 9   End of Session Nurse Communication: Mobility status  Activity Tolerance: Patient tolerated treatment well Patient left: in bed;with call bell/phone within reach;with bed alarm set;with  family/visitor present  OT Visit Diagnosis: Unsteadiness on feet (R26.81);Repeated falls (R29.6);Muscle weakness (generalized) (M62.81);History of falling (Z91.81)                Time: 1610-9604 OT Time Calculation (min): 15 min Charges:  OT General Charges $OT Visit: 1 Visit OT Evaluation $OT Eval Moderate Complexity: 1 [address] [place], OTR/L , CBIS ascom [phone]  01/21/23, 1:51 PM

## 2023-01-21 NOTE — Progress Notes (Signed)
[facility] Progress Note  Patient: Samuel Mack    ZOX:[id number]  DOA: 01/18/2023     Date of Service: the patient was seen and examined on 01/21/2023  Chief Complaint  Patient presents with   Altered Mental Status   Brief hospital course: ACEYN KOHLS is an 89 y.o. male past medical history of dementia seen for Worsening agitation Today drastic change in behavior in AM. In the last 24 hours the patient has twice been caught wandering. He is also having delusions and hallucination. Per his daughters he seems to be caught in the past. They have known for some time that the patient had memory issues, but in the last 2 weeks he has declined precipitously.     CXR from ED demonstrated basilar predominant opacities suggesting aspiration, atypical/viral pneumonia, or mild interstitial pulmonary edema. WBC mildly increased. UA was negative for UTI. The patient will be started on ceftriaxone and azithromycin. He will be tested for viral pathogens. SLP will be consulted for swallow evaluation.    CT head performed in the ED demonstrated changes representing either intracranial bleed or hemangioma. Neurosurgery was consulted. Dr. Myer Haff evaluated the patient. He contacted me to let me know that he saw an estra axial lesion in the right anterior fossa that was most consistent with a meningioma - a benign lesion that was unlikely to be a cause of the patient's symptoms.    It is possible that the patient's change in mental status is due to acute pulmonary infection. Will monitor closely for improvement. It is fairly clear that at the base of the patient's presentation is dementia. I have discussed with the daughters that I do not believe that the patient is safe to live on his own in the future.   Assessment and Plan:  # AMS (altered mental status) Likely due to a combination of pneumonia and progressive dementia. The pneumonia may be aspiration in origin. SLP will be consulted to evaluate the  patient for swallow safety.  COVID flu and RSV negative Respiratory viral panel was not collected yesterday, RN was advised to send RVP swab today. Continue ceftriaxone and azithromycin. Urine is negative for UTI. Agitation s/p Zyprexa and haldol.  WBC elevated, afebrile, we will continue to monitor. Started IV fluid for hydration  Urinary retention Foley catheter was inserted on 5/6 Will start Flomax when patient is able to take oral medications DC Foley for voiding trial when patient is ambulatory and alert   Hypomagnesemia, mag repleted. Monitor electrolytes and replete as needed.  Dementia: Continue delirium precautions and supportive care      Hypothyroidism Continue synthroid as at home when able to swallow safely.    Essential hypertension Continue home meds Cardizem and Bystolic Currently patient is not able to swallow due to altered mental status Use IV hydralazine as needed Monitor BP and titrate medication accordingly    Stage 3b chronic kidney disease (CKD) (HCC) Monitor creatinine, electrolytes, and volume.    S/p Pacemaker, Interrogation completed. Devise is working appropriately. No arrhythmias noted since last interrogated on 12/02/2022.    Body mass index is 33.05 kg/m.  Nutrition Problem: Inadequate oral intake Etiology: lethargy/confusion Interventions: Interventions: Refer to RD note for recommendations     Diet: NPO due to AMS DVT Prophylaxis: Subcutaneous Lovenox   Advance goals of care discussion: DNR  Family Communication: family was present at bedside, at the time of interview.  The pt provided permission to discuss medical plan with the family. Opportunity was given  to ask question and all questions were answered satisfactorily.   Disposition:  Pt is from Home, admitted with AMS, worsening of dementia, still has altered mental status, n.p.o., which precludes a safe discharge. Discharge to SNF/LTC with hospice services. Palliative care  consulted, family agreed with hospice services.   TOC is working on placement and discharge planning.   Subjective: No significant events overnight, patient was sleepy unable to follow any command, resting comfortably in deep sleep.  Did not arouse by calling her name and even with light touch. Patient's daughters were at bedside, management plan discussed, agreed with hospice placement.   Physical Exam: General: Sleepy, resting comfortably, NAD Appear in no distress, affect AMS Eyes: closed ENT: Oral Mucosa dry, unable to open mouth  Neck: no JVD,  Cardiovascular: S1 and S2 Present, no Murmur,  Respiratory: good respiratory effort, Bilateral Air entry equal and Decreased, no Crackles, no wheezes Abdomen: Bowel Sound present, Soft and no tenderness,  Skin: no rashes Extremities: no Pedal edema, no calf tenderness Neurologic: without any new focal findings, AMS, when all extremities spontaneously Gait not checked due to patient safety concerns  Vitals:   01/20/23 2014 01/21/23 0424 01/21/23 0815 01/21/23 1530  BP: 135/65 (!) 140/76 (!) 146/72 131/72  Pulse: 80 72 70 70  Resp: 18 18 16 16   Temp: 98.1 F (36.7 C) 97.6 F (36.4 C) (!) 97.1 F (36.2 C) 98.2 F (36.8 C)  TempSrc: Axillary Axillary Oral Oral  SpO2: 95% 96% 95% 99%  Weight:      Height:        Intake/Output Summary (Last 24 hours) at 01/21/2023 1611 Last data filed at 01/21/2023 1335 Gross per 24 hour  Intake --  Output 825 ml  Net -825 ml   Filed Weights   01/18/23 1335  Weight: 95.7 kg    Data Reviewed: I have personally reviewed and interpreted daily labs, tele strips, imagings as discussed above. I reviewed all nursing notes, pharmacy notes, vitals, pertinent old records I have discussed plan of care as described above with RN and patient/family.  CBC: Recent Labs  Lab 01/18/23 1337 01/19/23 1021 01/20/23 0851 01/21/23 0432  WBC 12.2* 12.6* 15.4* 12.7*  NEUTROABS  --  6.4  --   --   HGB 14.0  13.0 13.8 12.9*  HCT 44.0 39.5 41.3 38.2*  MCV 94.6 94.0 93.2 91.8  PLT 186 172 196 174   Basic Metabolic Panel: Recent Labs  Lab 01/18/23 1337 01/19/23 1021 01/20/23 0851 01/21/23 0432  NA 136 135 141 139  K 4.2 3.5 3.8 3.4*  CL 102 103 104 104  CO2 24 24 27  21*  GLUCOSE 110* 94 83 103*  BUN 25* 20 19 20   CREATININE 1.69* 1.34* 1.31* 1.39*  CALCIUM 10.9* 10.3 10.5* 9.8  MG  --   --  1.3* 2.0  PHOS  --   --  3.0 3.1    Studies: No results found.  Scheduled Meds:  ascorbic acid  500 mg Oral Daily   aspirin EC  81 mg Oral Daily   atorvastatin  10 mg Oral QHS   bacitracin-polymyxin b   Both Eyes BID   Chlorhexidine Gluconate Cloth  6 each Topical Daily   cholecalciferol  1,000 Units Oral Daily   cyanocobalamin  1,000 mcg Oral Daily   diltiazem  180 mg Oral Daily   enoxaparin (LOVENOX) injection  40 mg Subcutaneous Q24H   levothyroxine  150 mcg Oral q morning   multivitamin  1  tablet Oral BID   nebivolol  10 mg Oral Daily   pantoprazole (PROTONIX) IV  40 mg Intravenous QHS   spironolactone  25 mg Oral Daily   Continuous Infusions:  azithromycin 500 mg (01/20/23 1851)   cefTRIAXone (ROCEPHIN)  IV 2 g (01/20/23 1759)   dextrose 5% lactated ringers 75 mL/hr at 01/20/23 1716   PRN Meds: acetaminophen **OR** acetaminophen (TYLENOL) oral liquid 160 mg/5 mL **OR** acetaminophen, haloperidol lactate, hydrALAZINE  Time spent: 35 minutes  Author: Gillis Santa. MD [facility] 01/21/2023 4:11 PM  To reach On-call, see care teams to locate the attending and reach out to them via [email or web address]. If 7PM-7AM, please contact night-coverage If you still have difficulty reaching the attending provider, please page the [facility] (Director on Call) for [facility] on amion for assistance.

## 2023-01-21 NOTE — Consult Note (Signed)
Consultation Note Date: 01/21/2023 at 1045  Patient Name: Samuel Mack  DOB: 30-Dec-1929  MRN: [id number]  Age / Sex: 89 y.o., male  PCP: Sherrie Mustache, MD Referring Physician: Gillis Santa, MD  Reason for Consultation: Establishing goals of care  HPI/Patient Profile: 89 y.o. male  with past medical history of dementia, presence of pacemaker, hypothyroidism, HTN, and CKD (stage IIIb) admitted on 01/18/2023 with worsening agitation with wandering behaviors.  As per daughters, patient was having delusions and hallucinations.  Chest x-ray in ED revealed possible aspiration pneumonia.  CT in ED revealed either intracranial bleed or hemangioma.  Neurosurgery was consulted and advised that meningioma is present, a benign lesion that is unlikely to be cause of patient's symptoms.  Patient is being treated for AMS (suspected to be combination of pneumonia and progressive dementia), urinary retention (Foley), and hypomagnesemia.  PMT was consulted to discuss boundaries and goals of care.  Clinical Assessment and Goals of Care: I have reviewed medical records including EPIC notes, labs and imaging, assessed the patient and then met with patient, his daughter Samuel Mack, and daughter Samuel Mack at bedside to discuss diagnosis prognosis, GOC, EOL wishes, disposition and options.  I introduced Palliative Medicine as specialized medical care for people living with serious illness. It focuses on providing relief from the symptoms and stress of a serious illness. The goal is to improve quality of life for both the patient and the family.  We discussed a brief life review of the patient.  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  He was married and has two daughters. He has been a widow since 2014.  Family describes him as talkative, lovingly loud, and independent.  As far as functional and nutritional status  daughters endorses significant decline over the previous months and a rapid decline in the weeks leading up to this admission.  They endorse patient was sleeping more, not eating or drinking over the past several weeks, and unable to keep long-term memories intact as he previously had been able to.  They also shared that he would never want to live like this.  They shared this was his worst fear - that his mind would go and he would still be alive, unable to make his wishes known or to be independent.  We discussed patient's current illness and what it means in the larger context of patient's on-going co-morbidities.  We reviewed dementia is a progressive, chronic, and irreversible disease that is oftentimes accelerated by acute illnesses and hospitalizations.    Natural disease trajectory and expectations at EOL were discussed.  I attempted to elicit values and goals of care important to the patient.  Both daughter shared that they have suspected that patient is reaching end-of-life.  They appreciate confirmation from medical team that what they have been seeing for weeks now has been confirmed as patient's progression of dementia.  Both daughters in agreement they do not want patient to suffer, they do not want him to be agitated or aggravated, and want  him to be pain-free.  They both want patient to have quality of life over quantity of life.  The difference between aggressive medical intervention and comfort care was considered in light of the patient's goals of care.  Hospice services and philosophy discussed.  Family is accepting of enacting hospice benefits.  They would like patient to be evaluated for inpatient placement as they do not believe he is safe to return home and they are unable to appropriately manage his symptoms.  Discussed hospice inpatient placement versus hospice at home.  TOC and medical team made aware of family's wishes to proceed with discharge with hospice services.  We  discussed enacting comfort measures at this time.  Family would like additional time to continue current medical treatments in order for them to have friends/members of patient's church community to visit.  Advance directives, concepts specific to code status, artificial feeding and hydration, and rehospitalization were considered and discussed.  Daughter Samuel Mack is HCPOA.  She remains in agreement with DNR and DNI status.  Specifically, I discussed that patient's IVFs are an artificial means of sustaining hydration.  Discussed removing these and allowing natural disease progression to occur.  Family is in agreement that they would eventually like to stop IVF as patient would not want an artificial way to prolong his life.  However, they would like for them to remain for hospitalization.  Symptoms assessed.  Patient is attempting to remove mittens and makes attempts to pull out his Foley.  However, signs of agitation such as brow furrowing, moaning and groaning, and fidgeting are not present.  Discussed use of as needed medications to better manage patient's agitation should it worsen.  Family in agreement.  We discussed focusing on quality of life and that continuing lab work does not align with minimizing pain/suffering/discomfort.  Family in agreement to stop blood draws.  They would like to continue with vital signs to be taken during the day with minimal interruptions for patient at night.  Nursing made aware.  Vital signs to be taken twice daily as per family's request.  Therapeutic silence, active listening, and emotional support provided to family.  Questions and concerns were addressed. The family was encouraged to call with questions or concerns.   Primary Decision Maker HCPOA  Physical Exam Vitals reviewed.  Constitutional:      General: He is not in acute distress.    Appearance: He is normal weight. He is not ill-appearing.  HENT:     Head: Normocephalic.     Mouth/Throat:      Mouth: Mucous membranes are moist.  Cardiovascular:     Rate and Rhythm: Normal rate.  Pulmonary:     Effort: Pulmonary effort is normal.  Abdominal:     Palpations: Abdomen is soft.  Musculoskeletal:     Comments: Generalized weakness  Skin:    General: Skin is warm and dry.     Findings: Bruising present.  Neurological:     Comments: Non verbal during my assessment     Palliative Assessment/Data: 30%     Thank you for this consult. Palliative medicine will continue to follow and assist holistically.   Time Total: 90 minutes Greater than 50%  of this time was spent counseling and coordinating care related to the above assessment and plan.  Signed by: Georgiann Cocker, DNP, FNP-BC Palliative Medicine    Please contact Palliative Medicine Team phone at [phone] for questions and concerns.  For individual provider: See Loretha Stapler

## 2023-01-21 NOTE — Evaluation (Signed)
Physical Therapy Evaluation Patient Details Name: Samuel Mack MRN: [id number] DOB: [birth year] Today's Date: 01/21/2023  History of Present Illness  Pt is a 89 y.o. male presenting to hospital 01/18/23 with AMS (delusions, hallucinations, agitation).  Imaging showing meningioma (CT of head)--unlikely to be cause of pt's symptoms per neurosurgery.  Pt admitted with AMS.  PMH includes htn, HLD, DM, dementia, AKI, a-fib, CHB (PPM in place), COPD, deafness L ear, decreased vision L eye, essential trmor, MI.  Clinical Impression  Prior to hospital admission, pt was independent with ambulation; lives alone in 1 level condo with 4 STE B railings.  During session pt oriented to name only and appearing confused in general; pt inconsistent with following 1 step cues.  Currently pt is min to mod assist x2 semi-supine to sitting edge of bed (increased assist required d/t pt having difficulty following cues); CGA to min assist x2 for safety standing from bed x2 trials; and min to mod assist x2 to take steps bed to recliner with B UE support (pt appearing impulsive and sat too early requiring increased assist to safely sit in recliner).  Pt would currently benefit from skilled PT to address noted impairments and functional limitations (see below for any additional details).  Upon hospital discharge, pt would benefit from ongoing therapy.    Recommendations for follow up therapy are one component of a multi-disciplinary discharge planning process, led by the attending physician.  Recommendations may be updated based on patient status, additional functional criteria and insurance authorization.  Follow Up Recommendations Can patient physically be transported by private vehicle: No     Assistance Recommended at Discharge Frequent or constant Supervision/Assistance  Patient can return home with the following  Two people to help with walking and/or transfers;Two people to help with bathing/dressing/bathroom;Assistance  with cooking/housework;Direct supervision/assist for medications management;Direct supervision/assist for financial management;Assist for transportation;Help with stairs or ramp for entrance    Equipment Recommendations  (TBD at next facility)  Recommendations for Other Services       Functional Status Assessment Patient has had a recent decline in their functional status and demonstrates the ability to make significant improvements in function in a reasonable and predictable amount of time.     Precautions / Restrictions Precautions Precautions: Fall Restrictions Weight Bearing Restrictions: No      Mobility  Bed Mobility Overal bed mobility: Needs Assistance Bed Mobility: Supine to Sit     Supine to sit: Min assist, Mod assist, +2 for physical assistance, HOB elevated     General bed mobility comments: assist for trunk and B LE's; vc's for technique    Transfers Overall transfer level: Needs assistance Equipment used: 2 person hand held assist Transfers: Sit to/from Stand, Bed to chair/wheelchair/BSC Sit to Stand: Min guard, Min assist, +2 physical assistance, +2 safety/equipment   Step pivot transfers: Min assist, Mod assist, +2 physical assistance, +2 safety/equipment       General transfer comment: x2 trials standing from bed; vc's for safety; stand step turn bed to recliner with B UE support (assist to steady, pt sitting early requiring increased assist to safely sit in recliner)    Ambulation/Gait               General Gait Details: deferred d/t safety concerns  Stairs            Wheelchair Mobility    Modified Rankin (Stroke Patients Only)       Balance Overall balance assessment: Needs assistance Sitting-balance support: No upper  extremity supported, Feet supported Sitting balance-Leahy Scale: Good Sitting balance - Comments: steady sitting reaching within BOS   Standing balance support: Bilateral upper extremity supported, During  functional activity Standing balance-Leahy Scale: Poor Standing balance comment: assist for balance in standing                             Pertinent Vitals/Pain Pain Assessment Pain Assessment: Faces Faces Pain Scale: No hurt Pain Intervention(s): Limited activity within patient's tolerance, Monitored during session, Repositioned    Home Living Family/patient expects to be discharged to:: Unsure Living Arrangements: Alone Available Help at Discharge: Family;Available PRN/intermittently Type of Home: Other(Comment) (condo) Home Access: Stairs to enter Entrance Stairs-Rails: Right;Left Entrance Stairs-Number of Steps: 4   Home Layout: One level Home Equipment: Grab bars - tub/shower;Cane - single Librarian, academic (2 wheels)      Prior Function Prior Level of Function : Independent/Modified Independent             Mobility Comments: Independent with ambulation.  Pt's daughter reports they haven't seen pt fall but has signs of falling.       Hand Dominance        Extremity/Trunk Assessment   Upper Extremity Assessment Upper Extremity Assessment: Defer to OT evaluation;Overall [facility] for tasks assessed    Lower Extremity Assessment Lower Extremity Assessment: Generalized weakness    Cervical / Trunk Assessment Cervical / Trunk Assessment: Other exceptions Cervical / Trunk Exceptions: forward head/shoulders  Communication   Communication: No difficulties  Cognition Arousal/Alertness:  (Initially sleeping but became alert easily) Behavior During Therapy: Impulsive Overall Cognitive Status: Impaired/Different from baseline Area of Impairment: Orientation, Attention, Following commands, Safety/judgement, Awareness, Problem solving, Memory                 Orientation Level: Disoriented to, Place, Time, Situation Current Attention Level: Selective Memory: Decreased recall of precautions, Decreased short-term memory Following Commands: Follows  one step commands inconsistently Safety/Judgement: Decreased awareness of safety, Decreased awareness of deficits   Problem Solving: Slow processing, Difficulty sequencing, Requires verbal cues, Requires tactile cues General Comments: Oriented to name only.        General Comments  Nursing cleared pt for participation in physical therapy.  Pt agreeable to PT session.  Pt's daughter present part of session.    Exercises  Transfer training   Assessment/Plan    PT Assessment Patient needs continued PT services  PT Problem List Decreased strength;Decreased activity tolerance;Decreased balance;Decreased mobility;Decreased coordination;Decreased cognition;Decreased knowledge of use of DME;Decreased safety awareness;Decreased knowledge of precautions       PT Treatment Interventions DME instruction;Gait training;Stair training;Functional mobility training;Therapeutic activities;Therapeutic exercise;Balance training;Cognitive remediation;Patient/family education    PT Goals (Current goals can be found in the Care Plan section)  Acute Rehab PT Goals Patient Stated Goal: to improve mobility PT Goal Formulation: With family Time For Goal Achievement: 02/04/23 Potential to Achieve Goals: Fair    Frequency Min 2X/week     Co-evaluation               AM-PAC PT "6 Clicks" Mobility  Outcome Measure Help needed turning from your back to your side while in a flat bed without using bedrails?: A Little Help needed moving from lying on your back to sitting on the side of a flat bed without using bedrails?: A Lot Help needed moving to and from a bed to a chair (including a wheelchair)?: Total Help needed standing up from a chair using  your arms (e.g., wheelchair or bedside chair)?: A Lot Help needed to walk in hospital room?: Total Help needed climbing 3-5 steps with a railing? : Total 6 Click Score: 10    End of Session Equipment Utilized During Treatment: Gait belt Activity  Tolerance: Patient tolerated treatment well Patient left: in bed (with OT present for OT evaluation) Nurse Communication: Mobility status;Precautions PT Visit Diagnosis: Unsteadiness on feet (R26.81);Muscle weakness (generalized) (M62.81);Other abnormalities of gait and mobility (R26.89)    Time: 8469-6295 PT Time Calculation (min) (ACUTE ONLY): 23 min   Charges:   PT Evaluation $PT Eval Low Complexity: 1 Low PT Treatments $Therapeutic Activity: 8-22 mins       Hendricks Limes, PT 01/21/23, 10:11 AM

## 2023-01-22 DIAGNOSIS — Z515 Encounter for palliative care: Secondary | ICD-10-CM | POA: Diagnosis not present

## 2023-01-22 DIAGNOSIS — R4182 Altered mental status, unspecified: Secondary | ICD-10-CM | POA: Diagnosis not present

## 2023-01-22 DIAGNOSIS — F03C11 Unspecified dementia, severe, with agitation: Secondary | ICD-10-CM | POA: Diagnosis not present

## 2023-01-22 LAB — MAGNESIUM: Magnesium: 1.4 mg/dL — ABNORMAL LOW (ref 1.7–2.4)

## 2023-01-22 LAB — BASIC METABOLIC PANEL
Anion gap: 4 — ABNORMAL LOW (ref 5–15)
BUN: 15 mg/dL (ref 8–23)
CO2: 30 mmol/L (ref 22–32)
Calcium: 9.7 mg/dL (ref 8.9–10.3)
Chloride: 106 mmol/L (ref 98–111)
Creatinine, Ser: 1.3 mg/dL — ABNORMAL HIGH (ref 0.61–1.24)
GFR, Estimated: 52 mL/min — ABNORMAL LOW (ref >60)
Glucose, Bld: 104 mg/dL — ABNORMAL HIGH (ref 70–99)
Potassium: 3.2 mmol/L — ABNORMAL LOW (ref 3.5–5.1)
Sodium: 140 mmol/L (ref 135–145)

## 2023-01-22 LAB — CBC
HCT: 37.9 % — ABNORMAL LOW (ref 39.0–52.0)
Hemoglobin: 12.5 g/dL — ABNORMAL LOW (ref 13.0–17.0)
MCH: 30.6 pg (ref 26.0–34.0)
MCHC: 33 g/dL (ref 30.0–36.0)
MCV: 92.7 fL (ref 80.0–100.0)
Platelets: 184 10*3/uL (ref 150–400)
RBC: 4.09 MIL/uL — ABNORMAL LOW (ref 4.22–5.81)
RDW: 13.9 % (ref 11.5–15.5)
WBC: 12.7 10*3/uL — ABNORMAL HIGH (ref 4.0–10.5)
nRBC: 0 % (ref 0.0–0.2)

## 2023-01-22 LAB — PHOSPHORUS: Phosphorus: 2.7 mg/dL (ref 2.5–4.6)

## 2023-01-22 LAB — CULTURE, BLOOD (ROUTINE X 2): Culture: NO GROWTH

## 2023-01-22 MED ORDER — POTASSIUM CHLORIDE 10 MEQ/100ML IV SOLN
10.0000 meq | INTRAVENOUS | Status: DC
  Filled 2023-01-22: qty 100

## 2023-01-22 MED ORDER — POTASSIUM CHLORIDE 10 MEQ/100ML IV SOLN
10.0000 meq | INTRAVENOUS | Status: AC
  Administered 2023-01-22 (×4): 10 meq via INTRAVENOUS

## 2023-01-22 MED ORDER — MAGNESIUM SULFATE 2 GM/50ML IV SOLN
2.0000 g | Freq: Once | INTRAVENOUS | Status: AC
  Administered 2023-01-22: 2 g via INTRAVENOUS
  Filled 2023-01-22: qty 50

## 2023-01-22 NOTE — TOC Progression Note (Signed)
Transition of Care [facility]) - Progression Note    Patient Details  Name: Samuel Mack MRN: [id number] Date of Birth: 1930-02-16  Transition of [facility]) CM/SW Contact  Allena Katz, LCSW Phone Number: 01/22/2023, 11:51 AM  Clinical Narrative:   Pt does not meet inpatient criteria for hospice. Per palliative family would like to pursue other options. CSW LVM with daughter [name] and will wait to hear back to discharge plan this patient.     Expected Discharge Plan: Home w Hospice Care Barriers to Discharge: Continued Medical Work up  Expected Discharge Plan and Services     Post Acute Care Choice: Hospice                                         Social Determinants of Health (SDOH) Interventions SDOH Screenings   Food Insecurity: No Food Insecurity (01/20/2023)  Housing: Low Risk  (01/20/2023)  Transportation Needs: No Transportation Needs (01/20/2023)  Utilities: Not At Risk (01/20/2023)  Tobacco Use: Medium Risk (01/19/2023)    Readmission Risk Interventions     No data to display

## 2023-01-22 NOTE — Progress Notes (Signed)
Civil engineer, contracting [facility])   Received request from Transitions of Care Manager, Allena Katz, to evaluate the patient for the [facility] in [place], [place].    Spoke with Adah Salvage (daughter) to initiate education related to hospice philosophy, services, and team approach to care. HL informed the daughter that the patient is not appropriate for the Hospice Home at this time.   Daughter verbalized understanding of information given. Per discussion, the plan is for patient to discharge to a LTC facility when one is found.  Hospice will follow the patient at the facility.     If patient is discharged to an assisted living facility, DME needs will be addressed.     Please send signed and completed DNR LTC with patient/family. Please provide prescriptions at discharge as needed to ensure ongoing symptom management.    [facility] information and contact numbers given to family & above information shared with TOC.   Please call with any questions/concerns.    Thank you for the opportunity to participate in this patient's care.  [facility] Liaison [phone]

## 2023-01-22 NOTE — TOC Initial Note (Signed)
Transition of Care [facility]) - Initial/Assessment Note    Patient Details  Name: Samuel Mack MRN: [id number] Date of Birth: 05-30-1930  Transition of [facility]) CM/SW Contact:    Samuel Katz, LCSW Phone Number: 01/22/2023, 12:55 PM  Clinical Narrative:   CSW spoke  with daughter Samuel Mack who is patietns POA. Samuel Mack reports after learning pt does not qualify for hospice house she would like LTC. She states that she has not applied for medicaid for him because he makes too much. Samuel Mack states that she would like to private pay for LTC and states that she has a mutual of [place] policy that pays over a hundred dollars a day for LTC. Samuel Mack states her father has had a significant decline as two weeks ago he was walking and going to church. Samuel Mack reports that she would like to get the patient placed. CSW offered patient daughter options for placement agencies. Daughter would like to use these and has no preference. Referral given to [facility] to assist with placement.     Expected Discharge Plan: Home w Hospice Care Barriers to Discharge: Continued Medical Work up   Patient Goals and CMS Choice Patient states their goals for this hospitalization and ongoing recovery are:: Return home with hospice CMS Medicare.gov Compare Post Acute Care list provided to:: Patient Represenative (must comment) (daughters)        Expected Discharge Plan and Services     Post Acute Care Choice: Hospice                                        Prior Living Arrangements/Services   Lives with:: Adult Children Patient language and need for interpreter reviewed:: Yes        Need for Family Participation in Patient Care: Yes (Comment) Care giver support system in place?: Yes (comment)      Activities of Daily Living Home Assistive Devices/Equipment: Other (Comment) ADL Screening (condition at time of admission) Patient's cognitive ability adequate to safely complete daily activities?: No Is the  patient deaf or have difficulty hearing?: Yes Does the patient have difficulty seeing, even when wearing glasses/contacts?: Yes Does the patient have difficulty concentrating, remembering, or making decisions?: Yes Patient able to express need for assistance with ADLs?: No Does the patient have difficulty dressing or bathing?: Yes Independently performs ADLs?: No Communication: Independent Dressing (OT): Needs assistance Is this a change from baseline?: Pre-admission baseline Grooming: Needs assistance Is this a change from baseline?: Pre-admission baseline Feeding: Needs assistance Is this a change from baseline?: Pre-admission baseline Bathing: Needs assistance Is this a change from baseline?: Pre-admission baseline Toileting: Needs assistance Is this a change from baseline?: Pre-admission baseline In/Out Bed: Needs assistance Is this a change from baseline?: Pre-admission baseline Walks in Home: Needs assistance Is this a change from baseline?: Pre-admission baseline Does the patient have difficulty walking or climbing stairs?: Yes Weakness of Legs: None Weakness of Arms/Hands: None  Permission Sought/Granted Permission sought to share information with : Family Supports       Permission granted to share info w AGENCY: [facility]        Emotional Assessment       Orientation: : Fluctuating Orientation (Suspected and/or reported Sundowners)      Admission diagnosis:  Altered mental status [R41.82] Altered mental status, unspecified altered mental status type [R41.82] AMS (altered mental status) [R41.82] Patient Active Problem List  Diagnosis Date Noted   AMS (altered mental status) 01/19/2023   Meningioma (HCC) 01/19/2023   Altered mental status 01/19/2023   Nausea and vomiting 08/27/2022   Transaminitis 08/27/2022   Calculus of gallbladder without cholecystitis without obstruction    Calculus of common duct    Elevated LFTs 04/17/2022   Dyslipidemia  04/17/2022   Hypothyroidism 04/17/2022   Sinus bradycardia 04/17/2022   Stage 3b chronic kidney disease (CKD) (HCC) 04/17/2022   Obesity (BMI 30-39.9) 07/23/2021   Pre-op evaluation 06/13/2020   Seasonal allergic rhinitis due to pollen 04/29/2020   Essential hypertension 04/29/2020   Pacemaker 01/20/2020   SSS (sick sinus syndrome) (HCC) 01/20/2020   PCP:  Sherrie Mustache, MD Pharmacy:   [phone], [place] - [address] [place] [address] Phone: [phone] Fax: [phone]     Social Determinants of Health (SDOH) Social History: SDOH Screenings   Food Insecurity: No Food Insecurity (01/20/2023)  Housing: Low Risk  (01/20/2023)  Transportation Needs: No Transportation Needs (01/20/2023)  Utilities: Not At Risk (01/20/2023)  Tobacco Use: Medium Risk (01/19/2023)   SDOH Interventions: Food Insecurity Interventions: Intervention Not Indicated Housing Interventions: Intervention Not Indicated Transportation Interventions: Intervention Not Indicated Utilities Interventions: Intervention Not Indicated   Readmission Risk Interventions     No data to display

## 2023-01-22 NOTE — Progress Notes (Signed)
[facility] Progress Note  Patient: Samuel Mack    JXB:[id number]  DOA: 01/18/2023     Date of Service: the patient was seen and examined on 01/22/2023  Chief Complaint  Patient presents with   Altered Mental Status   Brief hospital course: [name] is an 89 y.o. male past medical history of dementia seen for Worsening agitation Today drastic change in behavior in AM. In the last 24 hours the patient has twice been caught wandering. He is also having delusions and hallucination. Per his daughters he seems to be caught in the past. They have known for some time that the patient had memory issues, but in the last 2 weeks he has declined precipitously.     CXR from ED demonstrated basilar predominant opacities suggesting aspiration, atypical/viral pneumonia, or mild interstitial pulmonary edema. WBC mildly increased. UA was negative for UTI. The patient will be started on ceftriaxone and azithromycin. He will be tested for viral pathogens. SLP will be consulted for swallow evaluation.    CT head performed in the ED demonstrated changes representing either intracranial bleed or hemangioma. Neurosurgery was consulted. Dr. Myer Haff evaluated the patient. He contacted me to let me know that he saw an estra axial lesion in the right anterior fossa that was most consistent with a meningioma - a benign lesion that was unlikely to be a cause of the patient's symptoms.    It is possible that the patient's change in mental status is due to acute pulmonary infection. Will monitor closely for improvement. It is fairly clear that at the base of the patient's presentation is dementia. I have discussed with the daughters that I do not believe that the patient is safe to live on his own in the future.   Assessment and Plan:  # AMS (altered mental status) Likely due to a combination of pneumonia and progressive dementia. The pneumonia may be aspiration in origin. SLP will be consulted to evaluate the  patient for swallow safety.  COVID flu and RSV negative Respiratory viral panel was not collected yesterday, RN was advised to send RVP swab today. Continue ceftriaxone and azithromycin. Urine is negative for UTI. Agitation s/p Zyprexa and haldol.  WBC elevated, afebrile, we will continue to monitor. Started IV fluid for hydration  Urinary retention Foley catheter was inserted on 5/6 start Flomax when patient is able to take oral medications DC Foley for voiding trial when patient is ambulatory and alert  Hypokalemia, potassium repleted. Hypomagnesemia, mag repleted. Monitor electrolytes and replete as needed.  Dementia: Continue delirium precautions and supportive care      Hypothyroidism Continue synthroid as at home when able to swallow safely.    Essential hypertension Continue home meds Cardizem and Bystolic Currently patient is not able to swallow due to altered mental status Use IV hydralazine as needed Monitor BP and titrate medication accordingly    Stage 3b chronic kidney disease (CKD) (HCC) Monitor creatinine, electrolytes, and volume.    S/p Pacemaker, Interrogation completed. Devise is working appropriately. No arrhythmias noted since last interrogated on 12/02/2022.    Body mass index is 33.05 kg/m.  Nutrition Problem: Inadequate oral intake Etiology: lethargy/confusion Interventions: Interventions: Refer to RD note for recommendations     Diet: Heart healthy diet if able to swallow DVT Prophylaxis: Subcutaneous Lovenox   Advance goals of care discussion: DNR  Family Communication: family was present at bedside, at the time of interview.  The pt provided permission to discuss medical plan with the  family. Opportunity was given to ask question and all questions were answered satisfactorily.   Disposition:  Pt is from Home, admitted with AMS, worsening of dementia, still has altered mental status, n.p.o., which precludes a safe discharge. Discharge to  LTC with hospice services. Palliative care consulted, family agreed with hospice services.   TOC is working on placement and discharge planning.   Subjective: No significant events overnight, patient was awake, AAO x 1, still having hallucinations as per patient's daughter who were available at the bedside.  Patient has mittens and was trying to take them off.  Patient is unable to offer any complaints.   Physical Exam: General: Sleepy, resting comfortably, NAD Appear in no distress, affect AMS Eyes: PERRLA ENT: Oral Mucosa dry, unable to open mouth  Neck: no JVD,  Cardiovascular: S1 and S2 Present, no Murmur,  Respiratory: good respiratory effort, Bilateral Air entry equal and Decreased, no Crackles, no wheezes Abdomen: Bowel Sound present, Soft and no tenderness,  Skin: no rashes Extremities: no Pedal edema, no calf tenderness Neurologic: without any new focal findings, AMS, when all extremities spontaneously Gait not checked due to patient safety concerns  Vitals:   01/21/23 1530 01/21/23 2153 01/22/23 0531 01/22/23 0903  BP: 131/72 (!) 141/67 (!) 145/74 (!) 140/76  Pulse: 70 71 79 97  Resp: 16 20 20    Temp: 98.2 F (36.8 C) 98.2 F (36.8 C) 98.6 F (37 C) 98.1 F (36.7 C)  TempSrc: Oral   Oral  SpO2: 99% 99% 99% 94%  Weight:      Height:        Intake/Output Summary (Last 24 hours) at 01/22/2023 1516 Last data filed at 01/22/2023 1426 Gross per 24 hour  Intake 1845 ml  Output --  Net 1845 ml   Filed Weights   01/18/23 1335  Weight: 95.7 kg    Data Reviewed: I have personally reviewed and interpreted daily labs, tele strips, imagings as discussed above. I reviewed all nursing notes, pharmacy notes, vitals, pertinent old records I have discussed plan of care as described above with RN and patient/family.  CBC: Recent Labs  Lab 01/18/23 1337 01/19/23 1021 01/20/23 0851 01/21/23 0432 01/22/23 0613  WBC 12.2* 12.6* 15.4* 12.7* 12.7*  NEUTROABS  --  6.4  --    --   --   HGB 14.0 13.0 13.8 12.9* 12.5*  HCT 44.0 39.5 41.3 38.2* 37.9*  MCV 94.6 94.0 93.2 91.8 92.7  PLT 186 172 196 174 184   Basic Metabolic Panel: Recent Labs  Lab 01/18/23 1337 01/19/23 1021 01/20/23 0851 01/21/23 0432 01/22/23 0613  NA 136 135 141 139 140  K 4.2 3.5 3.8 3.4* 3.2*  CL 102 103 104 104 106  CO2 24 24 27  21* 30  GLUCOSE 110* 94 83 103* 104*  BUN 25* 20 19 20 15   CREATININE 1.69* 1.34* 1.31* 1.39* 1.30*  CALCIUM 10.9* 10.3 10.5* 9.8 9.7  MG  --   --  1.3* 2.0 1.4*  PHOS  --   --  3.0 3.1 2.7    Studies: No results found.  Scheduled Meds:  aspirin EC  81 mg Oral Daily   atorvastatin  10 mg Oral QHS   bacitracin-polymyxin b   Both Eyes BID   Chlorhexidine Gluconate Cloth  6 each Topical Daily   diltiazem  180 mg Oral Daily   levothyroxine  150 mcg Oral q morning   multivitamin  1 tablet Oral BID   nebivolol  10 mg  Oral Daily   pantoprazole (PROTONIX) IV  40 mg Intravenous QHS   spironolactone  25 mg Oral Daily   Continuous Infusions:  cefTRIAXone (ROCEPHIN)  IV Stopped (01/21/23 1838)   dextrose 5% lactated ringers 75 mL/hr at 01/22/23 0531   potassium chloride 10 mEq (01/22/23 1315)   PRN Meds: acetaminophen **OR** acetaminophen (TYLENOL) oral liquid 160 mg/5 mL **OR** acetaminophen, haloperidol lactate, hydrALAZINE  Time spent: 35 minutes  Author: Gillis Santa. MD [facility] 01/22/2023 3:16 PM  To reach On-call, see care teams to locate the attending and reach out to them via [email or web address]. If 7PM-7AM, please contact night-coverage If you still have difficulty reaching the attending provider, please page the [facility] (Director on Call) for [facility] on amion for assistance.

## 2023-01-22 NOTE — Progress Notes (Addendum)
SLP Cancellation Note  Patient Details Name: LEMUEL KREINER MRN: [id number] DOB: 1930/07/12   Cancelled treatment:       Reason Eval/Treat Not Completed: Patient not medically ready;Fatigue/lethargy limiting ability to participate (chart reviewed; consulted Palliative Care re: pt's status and Family's GOC.)  Per Palliative Care NP, the family is planning to transition to comfort care status w/ Hospice discharge. Palliative Care reported that pt is less engaged now. Recommend holding on any cog-language evaluation at this time w/ current POC described per chart notes/Palliative Care. Such an evaluation, if indicated, would best be performed outside of hospitalization and Acuity of illness, in setting of pt's Baseline dx of Dementia/Cognitive decline and decline in functioning in recent weeks overall. Hospice services/MD could provide guidance re: such post discharge. ST services will sign off. MD to reconsult if any new needs while admitted.      Jerilynn Som, MS, CCC-SLP Speech Language Pathologist Rehab Services; [facility] [phone] (ascom) Delmar Dondero 01/22/2023, 9:05 AM

## 2023-01-22 NOTE — Progress Notes (Signed)
Palliative Care Progress Note, Assessment & Plan   Patient Name: Samuel Mack       Date: 01/22/2023 DOB: [birth year]  Age: 89 y.o. MRN#: [id number] Attending Physician: Gillis Santa, MD Primary Care Physician: Sherrie Mustache, MD Admit Date: 01/18/2023  Subjective: Patient is lying in bed, awake and alert.  He is able to acknowledge my presence and make his wishes known.  His speech is clear but his thought process is nonlinear.  He rambles but is pleasantly confused.  Both of his daughters are at bedside.  HPI: 89 y.o. male  with past medical history of dementia, presence of pacemaker, hypothyroidism, HTN, and CKD (stage IIIb) admitted on 01/18/2023 with worsening agitation with wandering behaviors.   As per daughters, patient was having delusions and hallucinations.   Chest x-ray in ED revealed possible aspiration pneumonia.  CT in ED revealed either intracranial bleed or hemangioma.  Neurosurgery was consulted and advised that meningioma is present, a benign lesion that is unlikely to be cause of patient's symptoms.   Patient is being treated for AMS (suspected to be combination of pneumonia and progressive dementia), urinary retention (Foley), and hypomagnesemia.   PMT was consulted to discuss boundaries and goals of care.  Summary of counseling/coordination of care: After reviewing the patient's chart and assessing the patient at bedside, with patient and daughters in regards to plan and goals of care.  Symptoms assessed.  Patient states he feels well.  PE did not reveal any non-verbal signs of discomfort or pain.  Mitts removed for comfort.  Family advised to replace if patient starts to fidget without response to redirection or if family leaves the room.  No adjustments to medications needed at this  time.  Patient does not appear agitated and no PRNs needed at this time either.  I discussed goals of care with daughters.  Lab work revealed electrolyte imbalance this AM.  Daughters remain in agreement to continue with treatment until patient is discharged from hospital - a differing view from discussions yesterday.    We again discussed that IVF is an artificial means of prolonging patient's life.  Family is clear that they would never want artificial means of hydration or nutrition to sustain patient's life.  However, they would like to continue with IV fluids while patient is in the hospital.  They shared they want their church family to be able to visit with patient while he is "perked up".  Labs and medical treatment to continue.  Plan remains for patient to meet with hospice liaison and determine discharge planning with hospice services. TOC and [facility] liaison following closley for discharge planning.   PMT will continue to follow and support patient and family throughout his hospitalization.  Physical Exam Vitals reviewed.  Constitutional:      General: He is not in acute distress.    Appearance: He is not ill-appearing.  HENT:     Head: Normocephalic.     Mouth/Throat:     Mouth: Mucous membranes are moist.  Eyes:     Pupils: Pupils are equal, round, and reactive to light.  Pulmonary:     Effort: Pulmonary effort is normal.  Abdominal:     Palpations: Abdomen  is soft.  Musculoskeletal:     Comments: Generalized weakness  Skin:    General: Skin is warm and dry.  Neurological:     Mental Status: He is alert.     Comments: Oriented to self             Total Time 25 minutes   Maeven Mcdougall L. Manon Hilding, FNP-BC Palliative Medicine Team Team Phone # [phone]

## 2023-01-22 NOTE — Progress Notes (Signed)
Nutrition Brief Note  Chart reviewed. Per palliative care and MD notes, plan to transition to comfort care. Plan to be evaluated for inpatient hospice.  No further nutrition interventions planned at this time.  Please re-consult as needed.   Levada Schilling, RD, LDN, CDCES Registered Dietitian II Certified Diabetes Care and Education Specialist Please refer to [facility] for RD and/or RD on-call/weekend/after hours pager

## 2023-01-23 DIAGNOSIS — N1832 Chronic kidney disease, stage 3b: Secondary | ICD-10-CM

## 2023-01-23 DIAGNOSIS — Z950 Presence of cardiac pacemaker: Secondary | ICD-10-CM

## 2023-01-23 DIAGNOSIS — D329 Benign neoplasm of meninges, unspecified: Secondary | ICD-10-CM | POA: Diagnosis not present

## 2023-01-23 DIAGNOSIS — R4182 Altered mental status, unspecified: Secondary | ICD-10-CM | POA: Diagnosis not present

## 2023-01-23 NOTE — Progress Notes (Signed)
[facility] Progress Note  Patient: Samuel Mack    WGN:[id number]  DOA: 01/18/2023     Date of Service: the patient was seen and examined on 01/23/2023  Chief Complaint  Patient presents with   Altered Mental Status   Brief hospital course: NIHAN HOLZKNECHT is an 89 y.o. male past medical history of dementia seen for Worsening agitation Today drastic change in behavior in AM. In the last 24 hours the patient has twice been caught wandering. He is also having delusions and hallucination. Per his daughters he seems to be caught in the past. They have known for some time that the patient had memory issues, but in the last 2 weeks he has declined precipitously.     CXR from ED demonstrated basilar predominant opacities suggesting aspiration, atypical/viral pneumonia, or mild interstitial pulmonary edema. WBC mildly increased. UA was negative for UTI. The patient will be started on ceftriaxone and azithromycin. He will be tested for viral pathogens. SLP will be consulted for swallow evaluation.    CT head performed in the ED demonstrated changes representing either intracranial bleed or hemangioma. Neurosurgery was consulted. Dr. Myer Haff evaluated the patient. He contacted me to let me know that he saw an estra axial lesion in the right anterior fossa that was most consistent with a meningioma - a benign lesion that was unlikely to be a cause of the patient's symptoms.    It is possible that the patient's change in mental status is due to acute pulmonary infection. Will monitor closely for improvement. It is fairly clear that at the base of the patient's presentation is dementia. I have discussed with the daughters that I do not believe that the patient is safe to live on his own in the future.   Assessment and Plan:  # AMS (altered mental status) Likely due to a combination of pneumonia and progressive dementia. The pneumonia may be aspiration in origin. SLP will be consulted to evaluate the  patient for swallow safety.  COVID flu and RSV negative Respiratory viral panel was not collected yesterday, RN was advised to send RVP swab today. Continue ceftriaxone and azithromycin. Urine is negative for UTI. Agitation s/p Zyprexa and haldol.  WBC elevated, afebrile, we will continue to monitor. Started IV fluid for hydration  Urinary retention Foley catheter was inserted on 5/6 start Flomax when patient is able to take oral medications DC Foley for voiding trial when patient is ambulatory and alert  Hypokalemia, potassium repleted. Hypomagnesemia, mag repleted. Monitor electrolytes and replete as needed.  Dementia: Continue delirium precautions and supportive care      Hypothyroidism Continue synthroid as at home when able to swallow safely.    Essential hypertension Continue home meds Cardizem and Bystolic Currently patient is not able to swallow due to altered mental status Use IV hydralazine as needed Monitor BP and titrate medication accordingly    Stage 3b chronic kidney disease (CKD) (HCC) Monitor creatinine, electrolytes, and volume.    S/p Pacemaker, Interrogation completed. Devise is working appropriately. No arrhythmias noted since last interrogated on 12/02/2022.    Body mass index is 33.05 kg/m.  Nutrition Problem: Inadequate oral intake Etiology: lethargy/confusion Interventions: Interventions: Refer to RD note for recommendations     Diet: Heart healthy diet if able to swallow DVT Prophylaxis: Subcutaneous Lovenox   Advance goals of care discussion: DNR  Family Communication: family was present at bedside, at the time of interview.  The pt provided permission to discuss medical plan with the  family. Opportunity was given to ask question and all questions were answered satisfactorily.   Disposition:  Pt is from Home, admitted with AMS, worsening of dementia, still has altered mental status, n.p.o., which precludes a safe discharge. Discharge to  LTC with hospice services. Palliative care consulted, family agreed with hospice services.   TOC is working on placement and discharge planning.   Subjective: As per patient's daughter patient was agitated last night and was trying to swing to RN.  Patient is still very confused, wearing mittens.  Patient was eating breakfast with help of her daughter.  Unable to offer any complaints.  At this time patient is resting audibly.   Physical Exam: General: Sleepy, resting comfortably, NAD Appear in no distress, affect AMS Eyes: PERRLA ENT: Oral Mucosa dry, unable to open mouth  Neck: no JVD,  Cardiovascular: S1 and S2 Present, no Murmur,  Respiratory: good respiratory effort, Bilateral Air entry equal and Decreased, no Crackles, no wheezes Abdomen: Bowel Sound present, Soft and no tenderness,  Skin: no rashes Extremities: no Pedal edema, no calf tenderness Neurologic: without any new focal findings, AMS, when all extremities spontaneously Gait not checked due to patient safety concerns  Vitals:   01/22/23 1617 01/22/23 2020 01/23/23 0443 01/23/23 0742  BP: (!) 156/72 (!) 152/87 (!) 147/82 (!) 153/78  Pulse: 74 71 72 68  Resp: 16 16 19 20   Temp: 97.9 F (36.6 C) 98.3 F (36.8 C) 98.9 F (37.2 C) 98.4 F (36.9 C)  TempSrc:  Oral    SpO2: 97% 100% 99% 94%  Weight:      Height:        Intake/Output Summary (Last 24 hours) at 01/23/2023 1351 Last data filed at 01/23/2023 0600 Gross per 24 hour  Intake 0 ml  Output 875 ml  Net -875 ml   Filed Weights   01/18/23 1335  Weight: 95.7 kg    Data Reviewed: I have personally reviewed and interpreted daily labs, tele strips, imagings as discussed above. I reviewed all nursing notes, pharmacy notes, vitals, pertinent old records I have discussed plan of care as described above with RN and patient/family.  CBC: Recent Labs  Lab 01/18/23 1337 01/19/23 1021 01/20/23 0851 01/21/23 0432 01/22/23 0613  WBC 12.2* 12.6* 15.4* 12.7*  12.7*  NEUTROABS  --  6.4  --   --   --   HGB 14.0 13.0 13.8 12.9* 12.5*  HCT 44.0 39.5 41.3 38.2* 37.9*  MCV 94.6 94.0 93.2 91.8 92.7  PLT 186 172 196 174 184   Basic Metabolic Panel: Recent Labs  Lab 01/18/23 1337 01/19/23 1021 01/20/23 0851 01/21/23 0432 01/22/23 0613  NA 136 135 141 139 140  K 4.2 3.5 3.8 3.4* 3.2*  CL 102 103 104 104 106  CO2 24 24 27  21* 30  GLUCOSE 110* 94 83 103* 104*  BUN 25* 20 19 20 15   CREATININE 1.69* 1.34* 1.31* 1.39* 1.30*  CALCIUM 10.9* 10.3 10.5* 9.8 9.7  MG  --   --  1.3* 2.0 1.4*  PHOS  --   --  3.0 3.1 2.7    Studies: No results found.  Scheduled Meds:  aspirin EC  81 mg Oral Daily   bacitracin-polymyxin b   Both Eyes BID   Chlorhexidine Gluconate Cloth  6 each Topical Daily   levothyroxine  150 mcg Oral q morning   nebivolol  10 mg Oral Daily   Continuous Infusions:  cefTRIAXone (ROCEPHIN)  IV 2 g (01/22/23 1817)  dextrose 5% lactated ringers 75 mL/hr at 01/22/23 0531   PRN Meds: acetaminophen **OR** acetaminophen (TYLENOL) oral liquid 160 mg/5 mL **OR** acetaminophen, haloperidol lactate, hydrALAZINE  Time spent: 35 minutes  Author: Gillis Santa. MD [facility] 01/23/2023 1:51 PM  To reach On-call, see care teams to locate the attending and reach out to them via [email or web address]. If 7PM-7AM, please contact night-coverage If you still have difficulty reaching the attending provider, please page the [facility] (Director on Call) for [facility] on amion for assistance.

## 2023-01-23 NOTE — Progress Notes (Signed)
PT Cancellation Note  Patient Details Name: Samuel Mack MRN: [id number] DOB: 1930/06/20   Cancelled Treatment:    Reason Eval/Treat Not Completed: Other (comment).  Per Palliative Care, pt's daughter/POA does not want to continue with therapies.  Will sign off at this time.  Hendricks Limes, PT 01/23/23, 3:28 PM

## 2023-01-23 NOTE — Progress Notes (Signed)
Palliative Care Progress Note, Assessment & Plan   Patient Name: Samuel Mack       Date: 01/23/2023 DOB: Oct 04, 1929  Age: 89 y.o. MRN#: [id number] Attending Physician: Gillis Santa, MD Primary Care Physician: Sherrie Mustache, MD Admit Date: 01/18/2023  Subjective: Patient is lying in bed with mitts in place.  He is sleeping in no apparent distress.  He is easily arousable but quickly returns to sleep.  His daughter [name] is at bedside.  HPI: 89 y.o. male  with past medical history of dementia, presence of pacemaker, hypothyroidism, HTN, and CKD (stage IIIb) admitted on 01/18/2023 with worsening agitation with wandering behaviors.   As per daughters, patient was having delusions and hallucinations.   Chest x-ray in ED revealed possible aspiration pneumonia.  CT in ED revealed either intracranial bleed or hemangioma.  Neurosurgery was consulted and advised that meningioma is present, a benign lesion that is unlikely to be cause of patient's symptoms.   Patient is being treated for AMS (suspected to be combination of pneumonia and progressive dementia), urinary retention (Foley), and hypomagnesemia.   PMT was consulted to discuss boundaries and goals of care.  Summary of counseling/coordination of care: After reviewing the patient's chart and assessing the patient at bedside, I spoke with [name] in regards to plan and goals of care.  [name] shares her hope is that patient can be placed in a LTC facility with hospice to follow. TOC is following closely and assisting with finding LTC.   We again discussed comfort measures and hospice philosophy.  [name] maintains she wants to continue current medical treatments until patient is discharged. No adjustments to medications or plan of care at this time.   [name]  shares that patient only ate a few bites of his breakfast.  We again discussed nutrition, starvation, decreased intake at EOL and safe p.o. intake in patients with dementia.  She shares patient has not asked for food.  However, when awake, [name] has been offering food and placing it in patient's mouth.  I again cautioned her for risk of aspiration if patient is not awake and alert appropriately to be able to chew and swallow.  [name] says she understands and does not want patient to choke or aspirate.  After meeting with patient and [name] at bedside, I counseled with dayshift RN Joe.  We discussed need for foley to remain at family's request as well as as patient shifting to hospice care at discharge.  Orders adjusted to reflect that foley is to continue.  PMT will continue to follow and support patient and family throughout his hospitalization.  Physical Exam Vitals reviewed.  Constitutional:      General: He is not in acute distress.    Appearance: He is not ill-appearing.  HENT:     Head: Normocephalic.     Mouth/Throat:     Mouth: Mucous membranes are moist.  Eyes:     Pupils: Pupils are equal, round, and reactive to light.  Pulmonary:     Effort: Pulmonary effort is normal.  Abdominal:     Palpations: Abdomen is soft.  Musculoskeletal:     Comments: Generalized weakness  Skin:    General: Skin is warm and  dry.             Total Time 35 minutes   Alicha Raspberry L. Manon Hilding, FNP-BC Palliative Medicine Team Team Phone # [phone]

## 2023-01-23 NOTE — Progress Notes (Signed)
OT Cancellation Note  Patient Details Name: Samuel Mack MRN: [id number] DOB: 1930/05/02   Cancelled Treatment:    Reason Eval/Treat Not Completed: Other (comment).Per Palliative Care, pt's daughter/POA does not want to continue with therapies.  Will sign off at this time.    Jackquline Denmark, MS, OTR/L , CBIS ascom [phone]  01/23/23, 3:55 PM

## 2023-01-24 DIAGNOSIS — R4182 Altered mental status, unspecified: Secondary | ICD-10-CM | POA: Diagnosis not present

## 2023-01-24 DIAGNOSIS — D329 Benign neoplasm of meninges, unspecified: Secondary | ICD-10-CM | POA: Diagnosis not present

## 2023-01-24 DIAGNOSIS — Z950 Presence of cardiac pacemaker: Secondary | ICD-10-CM | POA: Diagnosis not present

## 2023-01-24 DIAGNOSIS — N1832 Chronic kidney disease, stage 3b: Secondary | ICD-10-CM | POA: Diagnosis not present

## 2023-01-24 LAB — CULTURE, BLOOD (ROUTINE X 2): Special Requests: ADEQUATE

## 2023-01-24 MED ORDER — GLYCOPYRROLATE 0.2 MG/ML IJ SOLN: 0.2000 mg | INTRAMUSCULAR | Status: DC | PRN

## 2023-01-24 MED ORDER — GLYCOPYRROLATE 0.2 MG/ML IJ SOLN
0.2000 mg | INTRAMUSCULAR | Status: DC | PRN
  Filled 2023-01-24: qty 1

## 2023-01-24 MED ORDER — POLYVINYL ALCOHOL 1.4 % OP SOLN: 1.0000 [drp] | Freq: Four times a day (QID) | OPHTHALMIC | Status: DC | PRN

## 2023-01-24 MED ORDER — BIOTENE DRY MOUTH MT LIQD: 15.0000 mL | OROMUCOSAL | Status: DC | PRN

## 2023-01-24 MED ORDER — ACETAMINOPHEN 325 MG PO TABS: 650.0000 mg | ORAL_TABLET | Freq: Four times a day (QID) | ORAL | Status: DC | PRN

## 2023-01-24 MED ORDER — ACETAMINOPHEN 650 MG RE SUPP: 650.0000 mg | Freq: Four times a day (QID) | RECTAL | Status: DC | PRN

## 2023-01-24 MED ORDER — ONDANSETRON HCL 4 MG/2ML IJ SOLN: 4.0000 mg | Freq: Four times a day (QID) | INTRAMUSCULAR | Status: DC | PRN

## 2023-01-24 MED ORDER — GLYCOPYRROLATE 1 MG PO TABS: 1.0000 mg | ORAL_TABLET | ORAL | Status: DC | PRN

## 2023-01-24 MED ORDER — ONDANSETRON 4 MG PO TBDP: 4.0000 mg | ORAL_TABLET | Freq: Four times a day (QID) | ORAL | Status: DC | PRN

## 2023-01-24 NOTE — Progress Notes (Signed)
[facility] Progress Note  Patient: Samuel Mack    ZOX:[id number]  DOA: 01/18/2023     Date of Service: the patient was seen and examined on 01/24/2023  Chief Complaint  Patient presents with   Altered Mental Status   Brief hospital course: Samuel Mack is an 89 y.o. male past medical history of dementia seen for Worsening agitation Today drastic change in behavior in AM. In the last 24 hours the patient has twice been caught wandering. He is also having delusions and hallucination. Per his daughters he seems to be caught in the past. They have known for some time that the patient had memory issues, but in the last 2 weeks he has declined precipitously.     CXR from ED demonstrated basilar predominant opacities suggesting aspiration, atypical/viral pneumonia, or mild interstitial pulmonary edema. WBC mildly increased. UA was negative for UTI. The patient will be started on ceftriaxone and azithromycin. He will be tested for viral pathogens. SLP will be consulted for swallow evaluation.    CT head performed in the ED demonstrated changes representing either intracranial bleed or hemangioma. Neurosurgery was consulted. Dr. Myer Haff evaluated the patient. He contacted me to let me know that he saw an estra axial lesion in the right anterior fossa that was most consistent with a meningioma - a benign lesion that was unlikely to be a cause of the patient's symptoms.    It is possible that the patient's change in mental status is due to acute pulmonary infection. Will monitor closely for improvement. It is fairly clear that at the base of the patient's presentation is dementia. I have discussed with the daughters that I do not believe that the patient is safe to live on his own in the future.   Assessment and Plan:  Goals of care discussion As per palliative care, family agreed to shift him to full comfort measures Discontinued all medications and continue as needed medications for  comfort only.  # AMS (altered mental status) Likely due to a combination of pneumonia and progressive dementia. The pneumonia may be aspiration in origin. SLP will be consulted to evaluate the patient for swallow safety.  COVID flu and RSV negative. RVP negative. S/p ceftriaxone and azithromycin. Urine is negative for UTI. Agitation s/p Zyprexa and haldol. S/p  IV fluid for hydration # Urinary retention, Foley catheter was inserted on 5/6 DC Foley for voiding trial when patient is ambulatory and alert # Hypokalemia, potassium repleted. # Hypomagnesemia, mag repleted. # Dementia: Continue delirium precautions and supportive care  # Hypothyroidism, s/p synthroid, DC'd due to comfort measures # Essential hypertension, patient was on Cardizem and Bystolic at home, medications has been discontinued due to comfort measures only # Stage 3b chronic kidney disease (CKD) (HCC) S/p Pacemaker, Interrogation completed. Devise is working appropriately. No arrhythmias noted since last interrogated on 12/02/2022.    Body mass index is 33.05 kg/m.  Nutrition Problem: Inadequate oral intake Etiology: lethargy/confusion Interventions: Interventions: Refer to RD note for recommendations     Diet: Heart healthy diet if able to swallow DVT Prophylaxis: Subcutaneous Lovenox   Advance goals of care discussion: DNR  Family Communication: family was present at bedside, at the time of interview.  The pt provided permission to discuss medical plan with the family. Opportunity was given to ask question and all questions were answered satisfactorily.   Disposition:  Pt is from Home, admitted with AMS, worsening of dementia, still has altered mental status, n.p.o., which precludes a safe  discharge. Discharge to LTC with hospice services. Palliative care consulted, family agreed with hospice services and shifted him to comfort measures on 5/10. TOC is working on placement and discharge planning.   Subjective:  Patient was resting firmly in the bed, unable to offer any complaints.  Patient's daughters were at bedside, agreed with current plan for hospice placement.   Physical Exam: General: Sleepy, resting comfortably, NAD Appear in no distress, affect AMS Eyes: PERRLA ENT: Oral Mucosa dry, unable to open mouth  Neck: no JVD,  Cardiovascular: S1 and S2 Present, no Murmur,  Respiratory: good respiratory effort, Bilateral Air entry equal and Decreased, no Crackles, no wheezes Abdomen: Bowel Sound present, Soft and no tenderness,  Skin: no rashes Extremities: no Pedal edema, no calf tenderness Neurologic: without any new focal findings, AMS, when all extremities spontaneously Gait not checked due to patient safety concerns  Vitals:   01/23/23 1633 01/23/23 2003 01/24/23 0311 01/24/23 0742  BP: (!) 152/78 (!) 145/69 (!) 142/75 (!) 135/107  Pulse: 68 70 68 75  Resp: 20 20 20 16   Temp: 98 F (36.7 C) 98.9 F (37.2 C) 99 F (37.2 C) 98.6 F (37 C)  TempSrc: Oral Oral Oral Oral  SpO2: 95% 99% 98% 96%  Weight:      Height:        Intake/Output Summary (Last 24 hours) at 01/24/2023 1549 Last data filed at 01/24/2023 0500 Gross per 24 hour  Intake --  Output 600 ml  Net -600 ml   Filed Weights   01/18/23 1335  Weight: 95.7 kg    Data Reviewed: I have personally reviewed and interpreted daily labs, tele strips, imagings as discussed above. I reviewed all nursing notes, pharmacy notes, vitals, pertinent old records I have discussed plan of care as described above with RN and patient/family.  CBC: Recent Labs  Lab 01/18/23 1337 01/19/23 1021 01/20/23 0851 01/21/23 0432 01/22/23 0613  WBC 12.2* 12.6* 15.4* 12.7* 12.7*  NEUTROABS  --  6.4  --   --   --   HGB 14.0 13.0 13.8 12.9* 12.5*  HCT 44.0 39.5 41.3 38.2* 37.9*  MCV 94.6 94.0 93.2 91.8 92.7  PLT 186 172 196 174 184   Basic Metabolic Panel: Recent Labs  Lab 01/18/23 1337 01/19/23 1021 01/20/23 0851 01/21/23 0432  01/22/23 0613  NA 136 135 141 139 140  K 4.2 3.5 3.8 3.4* 3.2*  CL 102 103 104 104 106  CO2 24 24 27  21* 30  GLUCOSE 110* 94 83 103* 104*  BUN 25* 20 19 20 15   CREATININE 1.69* 1.34* 1.31* 1.39* 1.30*  CALCIUM 10.9* 10.3 10.5* 9.8 9.7  MG  --   --  1.3* 2.0 1.4*  PHOS  --   --  3.0 3.1 2.7    Studies: No results found.  Scheduled Meds:  bacitracin-polymyxin b   Both Eyes BID   Continuous Infusions:   PRN Meds: acetaminophen **OR** acetaminophen, antiseptic oral rinse, glycopyrrolate **OR** glycopyrrolate **OR** glycopyrrolate, haloperidol lactate, hydrALAZINE, ondansetron **OR** ondansetron (ZOFRAN) IV, polyvinyl alcohol  Time spent: 35 minutes  Author: Gillis Santa. MD [facility] 01/24/2023 3:49 PM  To reach On-call, see care teams to locate the attending and reach out to them via [email or web address]. If 7PM-7AM, please contact night-coverage If you still have difficulty reaching the attending provider, please page the [facility] (Director on Call) for [facility] on amion for assistance.

## 2023-01-24 NOTE — Discharge Summary (Signed)
[facility] Discharge Summary   Patient: Samuel Mack WUJ:[id number]  PCP: Sherrie Mustache, MD  Date of admission: 01/18/2023   Date of discharge:  01/24/2023     Discharge Diagnoses:  Principal Problem:   AMS (altered mental status) Active Problems:   Hypothyroidism   Essential hypertension   Pacemaker   Stage 3b chronic kidney disease (CKD) (HCC)   Meningioma (HCC)   Altered mental status   Admitted From: Home Disposition:  Home   Recommendations for Outpatient Follow-up:  Hospice Care Follow up LABS/TEST:  none   Diet recommendation: Regular diet  Activity: The patient is advised to gradually reintroduce usual activities, as tolerated  Discharge Condition: stable  Code Status: DNR   History of present illness: As per the H and P dictated on admission Hospital Course:  Samuel Mack is an 89 y.o. male past medical history of dementia seen for Worsening agitation Today drastic change in behavior in AM. In the last 24 hours the patient has twice been caught wandering. He is also having delusions and hallucination. Per his daughters he seems to be caught in the past. They have known for some time that the patient had memory issues, but in the last 2 weeks he has declined precipitously.     CXR from ED demonstrated basilar predominant opacities suggesting aspiration, atypical/viral pneumonia, or mild interstitial pulmonary edema. WBC mildly increased. UA was negative for UTI. The patient will be started on ceftriaxone and azithromycin. He will be tested for viral pathogens. SLP will be consulted for swallow evaluation.    CT head performed in the ED demonstrated changes representing either intracranial bleed or hemangioma. Neurosurgery was consulted. Dr. Myer Haff evaluated the patient. He contacted me to let me know that he saw an estra axial lesion in the right anterior fossa that was most consistent with a meningioma - a benign lesion that was unlikely to be a cause of  the patient's symptoms.   Assessment and Plan:   Goals of care discussion As per palliative care, family agreed to shift him to full comfort measures Discontinued all medications and continue as needed medications for comfort only.  # AMS (altered mental status) Likely due to a combination of pneumonia and progressive dementia. The pneumonia may be aspiration in origin. SLP will be consulted to evaluate the patient for swallow safety.  COVID flu and RSV negative. RVP negative. S/p ceftriaxone and azithromycin. Urine is negative for UTI. Agitation s/p Zyprexa and haldol. S/p  IV fluid for hydration # Urinary retention, Foley catheter was inserted on 5/6 DC Foley for voiding trial when patient is ambulatory and alert # Hypokalemia, potassium repleted. # Hypomagnesemia, mag repleted. # Dementia: Continue delirium precautions and supportive care  # Hypothyroidism, s/p synthroid, DC'd due to comfort measures # Essential hypertension, patient was on Cardizem and Bystolic at home, medications has been discontinued due to comfort measures only # Stage 3b chronic kidney disease (CKD) (HCC) S/p Pacemaker, Interrogation completed. Devise is working appropriately. No arrhythmias noted since last interrogated on 12/02/2022.    Body mass index is 33.05 kg/m.  Nutrition Problem: Inadequate oral intake Etiology: lethargy/confusion Nutrition Interventions: Interventions: Refer to RD note for recommendations  Patient got accepted at hospice home facility, so discharging today.  Consultants: Neurosurgery, palliative care and hospice care Procedures: None  Discharge Exam: General: NAD, lying comfortably. Cardiovascular: S1 and S2 Present, no Murmur, Respiratory: normal respiratory effort, Bilateral Air entry present and no Crackles, no wheezes Abdomen: Bowel Sound present, Soft  and no tenderness, no hernia Extremities: no Pedal edema, no calf tenderness Neurology: Altered mental status, unable to  follow any commands due to severe dementia  Filed Weights   01/18/23 1335  Weight: 95.7 kg   Vitals:   01/24/23 0311 01/24/23 0742  BP: (!) 142/75 (!) 135/107  Pulse: 68 75  Resp: 20 16  Temp: 99 F (37.2 C) 98.6 F (37 C)  SpO2: 98% 96%    DISCHARGE MEDICATION: Allergies as of 01/24/2023       Reactions   Bee Venom Swelling   Penicillins Swelling   Received 1st generation cephalosporin (CEFAZOLIN) on 05/12/2019 without documented ADRs.  Patient reports he swells and it was "something in the pcn" 50 year ago        Medication List     STOP taking these medications    ascorbic acid 500 MG tablet Commonly known as: VITAMIN C   aspirin 81 MG tablet   atorvastatin 10 MG tablet Commonly known as: LIPITOR   cholecalciferol 25 MCG (1000 UNIT) tablet Commonly known as: VITAMIN D3   Coenzyme Q10 100 MG Tabs   cyanocobalamin 1000 MCG tablet Commonly known as: VITAMIN B12   digoxin 0.125 MG tablet Commonly known as: LANOXIN   diltiazem 180 MG 24 hr tablet Commonly known as: CARDIZEM LA   Fish Oil 1200 MG Caps   levocetirizine 5 MG tablet Commonly known as: XYZAL   levothyroxine 150 MCG tablet Commonly known as: SYNTHROID   losartan 50 MG tablet Commonly known as: COZAAR   nebivolol 10 MG tablet Commonly known as: BYSTOLIC   potassium gluconate 595 (99 K) MG Tabs tablet   PRESERVISION AREDS PO   spironolactone 25 MG tablet Commonly known as: ALDACTONE   vitamin E 180 MG (400 UNITS) capsule       Allergies  Allergen Reactions   Bee Venom Swelling   Penicillins Swelling    Received 1st generation cephalosporin (CEFAZOLIN) on 05/12/2019 without documented ADRs.   Patient reports he swells and it was "something in the pcn" 50 year ago   Discharge Instructions     Diet general   Complete by: As directed    Discharge instructions   Complete by: As directed    Follow hospice care   Increase activity slowly   Complete by: As directed         The results of significant diagnostics from this hospitalization (including imaging, microbiology, ancillary and laboratory) are listed below for reference.    Significant Diagnostic Studies: CT Head Wo Contrast  Result Date: 01/18/2023 CLINICAL DATA:  Follow-up possible subdural hematoma. EXAM: CT HEAD WITHOUT CONTRAST TECHNIQUE: Contiguous axial images were obtained from the base of the skull through the vertex without intravenous contrast. RADIATION DOSE REDUCTION: This exam was performed according to the departmental dose-optimization program which includes automated exposure control, adjustment of the mA and/or kV according to patient size and/or use of iterative reconstruction technique. COMPARISON:  Earlier today FINDINGS: Brain: Again seen is the abnormal extra-axial soft tissue in the anterior right frontal region. On sagittal imaging, this appears to represent an extra-axial mass currently measuring 3.2 x 1.8 cm compared to 3.4 x 1.9 cm, not significantly changed. Suggestion of peripheral calcification. No real change since prior study. There is atrophy and chronic small vessel disease changes. No hydrocephalus. Vascular: No hyperdense vessel or unexpected calcification. Skull: No acute calvarial abnormality. Sinuses/Orbits: Opacified left maxillary sinus, left frontal sinus and scattered ethmoid air cells. No change. Other: None IMPRESSION:  Extra-axial soft tissue again seen in the anterior right frontal region, most likely reflecting extra-axial mass, less likely extra-axial hematoma. No change since prior study. Atrophy, chronic small vessel disease. Electronically Signed   By: Charlett Nose M.D.   On: 01/18/2023 21:38   DG Chest Portable 1 View  Result Date: 01/18/2023 CLINICAL DATA:  Altered mental status. EXAM: PORTABLE CHEST 1 VIEW COMPARISON:  04/16/2022. FINDINGS: Left chest dual-chamber pacemaker with leads projecting over the right atrium and ventricle. Basilar predominant  reticular opacities may reflect aspiration, atypical/viral infection or mild interstitial pulmonary edema. Stable cardiac and mediastinal contours. No pleural effusion or pneumothorax. Visualized bones and upper abdomen are unremarkable. IMPRESSION: Basilar predominant reticular opacities may reflect aspiration, atypical/viral infection or mild interstitial pulmonary edema. Electronically Signed   By: Orvan Falconer M.D.   On: 01/18/2023 17:19   CT Head Wo Contrast  Result Date: 01/18/2023 CLINICAL DATA:  Mental status change of unknown cause. EXAM: CT HEAD WITHOUT CONTRAST TECHNIQUE: Contiguous axial images were obtained from the base of the skull through the vertex without intravenous contrast. RADIATION DOSE REDUCTION: This exam was performed according to the departmental dose-optimization program which includes automated exposure control, adjustment of the mA and/or kV according to patient size and/or use of iterative reconstruction technique. COMPARISON:  None Available. FINDINGS: Brain: Focal area of increased attenuation along the anterior aspect of the right frontal lobe this may reflect an area of subdural hemorrhage. The appearance, however on the sagittal reconstructed image suggests that this may be an extra-axial mass measuring approximately 3.4 x 1.9 cm. Increased density may reflect areas of calcification. There is no other evidence of intracranial hemorrhage and no other evidence of a mass. Ventricles are normal in size and configuration for the patient's age. No evidence of an infarction. No midline shift. Mild periventricular white matter hypoattenuation noted consistent with chronic microvascular ischemic change. Vascular: No hyperdense vessel or unexpected calcification. Skull: Normal. Negative for fracture or focal lesion. Sinuses/Orbits: Globes and orbits are unremarkable. Left maxillary and frontal sinuses are opacified. Right frontal sinus mucosal thickening. Left anterior middle ethmoid  sinus mucosal thickening and right anterior mucosal sinus mucosal thickening. Left mastoid air cell opacification. Other: None. IMPRESSION: 1. Possible focus of subdural hemorrhage along the anterior aspect of the right frontal lobe versus a poorly defined extra-axial mass/meningioma. Recommend further assessment of this finding with MRI without and with contrast. 2. No other evidence of an acute abnormality. No evidence of an infarct. Electronically Signed   By: Amie Portland M.D.   On: 01/18/2023 15:19    Microbiology: Recent Results (from the past 240 hour(s))  Resp panel by RT-PCR (RSV, Flu A&B, Covid) Anterior Nasal Swab     Status: None   Collection Time: 01/18/23  5:30 PM   Specimen: Anterior Nasal Swab  Result Value Ref Range Status   SARS Coronavirus 2 by RT PCR NEGATIVE NEGATIVE Final    Comment: (NOTE) SARS-CoV-2 target nucleic acids are NOT DETECTED.  The SARS-CoV-2 RNA is generally detectable in upper respiratory specimens during the acute phase of infection. The lowest concentration of SARS-CoV-2 viral copies this assay can detect is 138 copies/mL. A negative result does not preclude SARS-Cov-2 infection and should not be used as the sole basis for treatment or other patient management decisions. A negative result may occur with  improper specimen collection/handling, submission of specimen other than nasopharyngeal swab, presence of viral mutation(s) within the areas targeted by this assay, and inadequate number of viral copies(<138  copies/mL). A negative result must be combined with clinical observations, patient history, and epidemiological information. The expected result is Negative.  Fact Sheet for Patients:  BloggerCourse.com  Fact Sheet for Healthcare Providers:  SeriousBroker.it  This test is no t yet approved or cleared by the [place] FDA and  has been authorized for detection and/or diagnosis of SARS-CoV-2  by FDA under an Emergency Use Authorization (EUA). This EUA will remain  in effect (meaning this test can be used) for the duration of the COVID-19 declaration under Section 564(b)(1) of the Act, 21 U.S.C.section 360bbb-3(b)(1), unless the authorization is terminated  or revoked sooner.       Influenza A by PCR NEGATIVE NEGATIVE Final   Influenza B by PCR NEGATIVE NEGATIVE Final    Comment: (NOTE) The Xpert Xpress SARS-CoV-2/FLU/RSV plus assay is intended as an aid in the diagnosis of influenza from Nasopharyngeal swab specimens and should not be used as a sole basis for treatment. Nasal washings and aspirates are unacceptable for Xpert Xpress SARS-CoV-2/FLU/RSV testing.  Fact Sheet for Patients: BloggerCourse.com  Fact Sheet for Healthcare Providers: SeriousBroker.it  This test is not yet approved or cleared by the [place] FDA and has been authorized for detection and/or diagnosis of SARS-CoV-2 by FDA under an Emergency Use Authorization (EUA). This EUA will remain in effect (meaning this test can be used) for the duration of the COVID-19 declaration under Section 564(b)(1) of the Act, 21 U.S.C. section 360bbb-3(b)(1), unless the authorization is terminated or revoked.     Resp Syncytial Virus by PCR NEGATIVE NEGATIVE Final    Comment: (NOTE) Fact Sheet for Patients: BloggerCourse.com  Fact Sheet for Healthcare Providers: SeriousBroker.it  This test is not yet approved or cleared by the [place] FDA and has been authorized for detection and/or diagnosis of SARS-CoV-2 by FDA under an Emergency Use Authorization (EUA). This EUA will remain in effect (meaning this test can be used) for the duration of the COVID-19 declaration under Section 564(b)(1) of the Act, 21 U.S.C. section 360bbb-3(b)(1), unless the authorization is terminated or revoked.  Performed at  [facility], [address] [place], [place] [address]   Respiratory (~20 pathogens) panel by PCR     Status: None   Collection Time: 01/19/23 11:58 AM   Specimen: Nasopharyngeal Swab; Respiratory  Result Value Ref Range Status   Adenovirus NOT DETECTED NOT DETECTED Final   Coronavirus 229E NOT DETECTED NOT DETECTED Final    Comment: (NOTE) The Coronavirus on the Respiratory Panel, DOES NOT test for the novel  Coronavirus (2019 nCoV)    Coronavirus HKU1 NOT DETECTED NOT DETECTED Final   Coronavirus NL63 NOT DETECTED NOT DETECTED Final   Coronavirus OC43 NOT DETECTED NOT DETECTED Final   Metapneumovirus NOT DETECTED NOT DETECTED Final   Rhinovirus / Enterovirus NOT DETECTED NOT DETECTED Final   Influenza A NOT DETECTED NOT DETECTED Final   Influenza B NOT DETECTED NOT DETECTED Final   Parainfluenza Virus 1 NOT DETECTED NOT DETECTED Final   Parainfluenza Virus 2 NOT DETECTED NOT DETECTED Final   Parainfluenza Virus 3 NOT DETECTED NOT DETECTED Final   Parainfluenza Virus 4 NOT DETECTED NOT DETECTED Final   Respiratory Syncytial Virus NOT DETECTED NOT DETECTED Final   Bordetella pertussis NOT DETECTED NOT DETECTED Final   Bordetella Parapertussis NOT DETECTED NOT DETECTED Final   Chlamydophila pneumoniae NOT DETECTED NOT DETECTED Final   Mycoplasma pneumoniae NOT DETECTED NOT DETECTED Final    Comment: Performed at [facility],  [address], [place], [place] [address]  Culture, blood (Routine X 2) w Reflex to ID Panel     Status: None   Collection Time: 01/19/23  9:23 PM   Specimen: BLOOD  Result Value Ref Range Status   Specimen Description BLOOD BLOOD RIGHT ARM  Final   Special Requests   Final    BOTTLES DRAWN AEROBIC AND ANAEROBIC Blood Culture adequate volume   Culture   Final    NO GROWTH 5 DAYS Performed at [facility], [address] [place], [place] [address]    Report Status 01/24/2023 FINAL  Final  Culture, blood (Routine X 2) w Reflex  to ID Panel     Status: None   Collection Time: 01/19/23  9:33 PM   Specimen: BLOOD  Result Value Ref Range Status   Specimen Description BLOOD BLOOD LEFT HAND  Final   Special Requests   Final    BOTTLES DRAWN AEROBIC AND ANAEROBIC Blood Culture adequate volume   Culture   Final    NO GROWTH 5 DAYS Performed at [facility], [address] [place], [place] [address]    Report Status 01/24/2023 FINAL  Final     Labs: CBC: Recent Labs  Lab 01/18/23 1337 01/19/23 1021 01/20/23 0851 01/21/23 0432 01/22/23 0613  WBC 12.2* 12.6* 15.4* 12.7* 12.7*  NEUTROABS  --  6.4  --   --   --   HGB 14.0 13.0 13.8 12.9* 12.5*  HCT 44.0 39.5 41.3 38.2* 37.9*  MCV 94.6 94.0 93.2 91.8 92.7  PLT 186 172 196 174 184   Basic Metabolic Panel: Recent Labs  Lab 01/18/23 1337 01/19/23 1021 01/20/23 0851 01/21/23 0432 01/22/23 0613  NA 136 135 141 139 140  K 4.2 3.5 3.8 3.4* 3.2*  CL 102 103 104 104 106  CO2 24 24 27  21* 30  GLUCOSE 110* 94 83 103* 104*  BUN 25* 20 19 20 15   CREATININE 1.69* 1.34* 1.31* 1.39* 1.30*  CALCIUM 10.9* 10.3 10.5* 9.8 9.7  MG  --   --  1.3* 2.0 1.4*  PHOS  --   --  3.0 3.1 2.7   Liver Function Tests: Recent Labs  Lab 01/18/23 1337  AST 38  ALT 20  ALKPHOS 94  BILITOT 1.2  PROT 7.9  ALBUMIN 4.0   No results for input(s): "LIPASE", "AMYLASE" in the last 168 hours. No results for input(s): "AMMONIA" in the last 168 hours. Cardiac Enzymes: No results for input(s): "CKTOTAL", "CKMB", "CKMBINDEX", "TROPONINI" in the last 168 hours. BNP (last 3 results) Recent Labs    04/16/22 1824  BNP 175.7*   CBG: No results for input(s): "GLUCAP" in the last 168 hours.  Time spent: 35 minutes  Signed:  Gillis Santa  [facility]  01/24/2023 4:57 PM

## 2023-01-24 NOTE — Progress Notes (Signed)
PT discharged, report already given by AM RN. No belongings seen, documents given to transport. Vitals WNL, no distress noted. Pt left unit with transport x2 . No needs at this time.

## 2023-01-24 NOTE — Progress Notes (Signed)
Palliative Care Progress Note, Assessment & Plan   Patient Name: Samuel Mack       Date: 01/24/2023 DOB: Nov 04, 1929  Age: 89 y.o. MRN#: [id number] Attending Physician: Gillis Santa, MD Primary Care Physician: Sherrie Mustache, MD Admit Date: 01/18/2023  Subjective: Patient is lying in bed, sleeping, with mitts in place.  He has no signs of distress, pain, or agitation at this time.  His daughters are at bedside.  HPI: 89 y.o. male  with past medical history of dementia, presence of pacemaker, hypothyroidism, HTN, and CKD (stage IIIb) admitted on 01/18/2023 with worsening agitation with wandering behaviors.   As per daughters, patient was having delusions and hallucinations.   Chest x-ray in ED revealed possible aspiration pneumonia.  CT in ED revealed either intracranial bleed or hemangioma.  Neurosurgery was consulted and advised that meningioma is present, a benign lesion that is unlikely to be cause of patient's symptoms.   Patient is being treated for AMS (suspected to be combination of pneumonia and progressive dementia), urinary retention (Foley), and hypomagnesemia.   PMT was consulted to discuss boundaries and goals of care.  Summary of counseling/coordination of care: After reviewing the patient's chart and assessing the patient at bedside, I spoke with patient's daughters in regards to plan and goals of care.  I expressed I have noted a shift and patient from yesterday to today.  He is minimally interactive, he is no longer putting forth the energy to fidget with his mitts, his cough is weaker, and I am able to hear more secretions with his breathing.  Daughter shared they have noticed his cough is also become weak and he is not able to recognize them anymore or as conversant.  We discussed  stopping oral intake as it would no longer be safe and put patient at risk of aspiration.  Reviewed patient can access floor stock if he is awake and asking for food.  Daughter shared they are ready to shift to full comfort measures.  They state that the patient's current condition is not what the patient would want and that he would not want anything artificially to sustain his life.    We discussed removal of IV fluids and discontinuing monitoring of vital signs and blood work.  Family was in agreement. Full comfort measures in place.   We discussed unrestricted visitor access, use of as needed medications for terminal agitation and secretions, increased work of breathing, sense of breathlessness, and anxiety.  We also reviewed continuing Foley for comfort at end-of-life.  Discussed signs of impending death though patient is not actively dying at the moment.  Reviewed that patient's pacemaker will be need to be discontinued.  Daughters share they understand the need for it to be turned off but are just not quite ready to do that today.  We discussed placement in a hospice inpatient unit.  Daughters would like hospice to re-evaluate for inpatient placement and are willing to discuss private pay options.  Attending, TOC, and RN made aware of above discussions and wishes for IPU re-evaluation.  PMT will continue to follow and remain available to patient and family throughout his hospitalization.  Physical Exam Vitals reviewed.  Constitutional:  General: He is not in acute distress.    Appearance: He is not ill-appearing.  HENT:     Head: Normocephalic.     Mouth/Throat:     Mouth: Mucous membranes are moist.  Eyes:     Pupils: Pupils are equal, round, and reactive to light.  Cardiovascular:     Rate and Rhythm: Normal rate.     Pulses: Normal pulses.  Pulmonary:     Effort: Pulmonary effort is normal.  Abdominal:     Palpations: Abdomen is soft.  Musculoskeletal:     Comments:  Generalized weakness             Total Time 50 minutes   Nanetta Wiegman L. Manon Hilding, FNP-BC Palliative Medicine Team Team Phone # [phone]

## 2023-02-15 DEATH — deceased
